# Patient Record
Sex: Male | Born: 1970 | Race: White | Hispanic: No | Marital: Married | Smoking: Current every day smoker
Health system: Southern US, Community
[De-identification: ages and names within clinical notes are randomized; demographics above are authoritative.]

## PROBLEM LIST (undated history)

## (undated) DIAGNOSIS — F419 Anxiety disorder, unspecified: Secondary | ICD-10-CM

## (undated) DIAGNOSIS — F32A Depression, unspecified: Secondary | ICD-10-CM

## (undated) DIAGNOSIS — F410 Panic disorder [episodic paroxysmal anxiety] without agoraphobia: Secondary | ICD-10-CM

## (undated) DIAGNOSIS — F429 Obsessive-compulsive disorder, unspecified: Secondary | ICD-10-CM

## (undated) DIAGNOSIS — F3181 Bipolar II disorder: Secondary | ICD-10-CM

## (undated) DIAGNOSIS — F4310 Post-traumatic stress disorder, unspecified: Secondary | ICD-10-CM

## (undated) DIAGNOSIS — K589 Irritable bowel syndrome without diarrhea: Secondary | ICD-10-CM

## (undated) DIAGNOSIS — F4010 Social phobia, unspecified: Secondary | ICD-10-CM

## (undated) DIAGNOSIS — F514 Sleep terrors [night terrors]: Secondary | ICD-10-CM

## (undated) DIAGNOSIS — F329 Major depressive disorder, single episode, unspecified: Secondary | ICD-10-CM

## (undated) DIAGNOSIS — I10 Essential (primary) hypertension: Secondary | ICD-10-CM

## (undated) HISTORY — PX: SYMPATHECTOMY: SHX792

## (undated) HISTORY — PX: VASECTOMY: SHX75

## (undated) HISTORY — DX: Irritable bowel syndrome, unspecified: K58.9

---

## 1999-09-07 ENCOUNTER — Emergency Department (HOSPITAL_COMMUNITY): Admission: EM | Admit: 1999-09-07 | Discharge: 1999-09-07 | Payer: Self-pay | Admitting: Emergency Medicine

## 2001-06-22 ENCOUNTER — Emergency Department (HOSPITAL_COMMUNITY): Admission: EM | Admit: 2001-06-22 | Discharge: 2001-06-22 | Payer: Self-pay

## 2001-08-16 ENCOUNTER — Inpatient Hospital Stay (HOSPITAL_COMMUNITY): Admission: EM | Admit: 2001-08-16 | Discharge: 2001-08-21 | Payer: Self-pay | Admitting: Psychiatry

## 2001-08-22 ENCOUNTER — Emergency Department (HOSPITAL_COMMUNITY): Admission: EM | Admit: 2001-08-22 | Discharge: 2001-08-22 | Payer: Self-pay | Admitting: Emergency Medicine

## 2001-08-22 ENCOUNTER — Encounter: Payer: Self-pay | Admitting: Emergency Medicine

## 2001-08-25 ENCOUNTER — Emergency Department (HOSPITAL_COMMUNITY): Admission: EM | Admit: 2001-08-25 | Discharge: 2001-08-25 | Payer: Self-pay | Admitting: Emergency Medicine

## 2002-12-29 ENCOUNTER — Emergency Department (HOSPITAL_COMMUNITY): Admission: EM | Admit: 2002-12-29 | Discharge: 2002-12-29 | Payer: Self-pay | Admitting: Emergency Medicine

## 2009-06-01 ENCOUNTER — Emergency Department (HOSPITAL_COMMUNITY): Admission: EM | Admit: 2009-06-01 | Discharge: 2009-06-01 | Payer: Self-pay | Admitting: Emergency Medicine

## 2010-02-18 ENCOUNTER — Emergency Department (HOSPITAL_COMMUNITY)
Admission: EM | Admit: 2010-02-18 | Discharge: 2010-02-18 | Payer: Self-pay | Source: Home / Self Care | Admitting: Emergency Medicine

## 2010-05-25 LAB — GLUCOSE, CAPILLARY: Glucose-Capillary: 93 mg/dL (ref 70–99)

## 2010-07-31 NOTE — Discharge Summary (Signed)
[facility]  Patient:    Nicholas Melton, Nicholas Melton Visit Number: [id number] MRN: [id number]          Service Type: EMS Location: [facility] Attending Physician:  Hanley Seamen Dictated by:   Reymundo Poll Dub Mikes, M.D. Admit Date:  08/25/2001 Discharge Date: 08/25/2001                             Discharge Summary  CHIEF COMPLAINT AND PRESENTING ILLNESS:  This was the first admission to [facility] for this 40 year old male, committed on June 4 for intentional overdose.  History of suicidal attempt, took 9 Xanax and drank 6 to 7 beers on Tuesday, then he took 2 more tablets.  He was at home.  His wife was asleep, then he told his wife later.  York Spaniel he had gone to the emergency room because his wife had noted some blood in the toilet.  She was initially unaware that he had overdosed.  Reported that he was tired of living.  He is having problems with finances, lost his license, and also reports periods of rage and explosive behavior, destroying property, banging his head against a wall.  He gets out of control.  Currently feeling tired but can promise safety in the unit.  PAST PSYCHIATRIC HISTORY:  First time [facility], with [facility] 17 days 1 month ago for alcohol detox.  [facility] for 2 days 2 months ago.  Committed for suicidal thoughts.  Sees Dr. Valente David.  ALCOHOL AND DRUG HISTORY:  Relapsed 20 days ago on alcohol, drinking beer, 10 beers per day, history of blackouts.  Last drink June 3.  Longest sobriety 4 months.  No other substance use.  MEDICAL HISTORY:  Chronic back pain.  MEDICATIONS:  On Zoloft since December, noncompliance.  Has been on Anafranil in the past and Effexor and Seroquel in the past.  PHYSICAL EXAMINATION:  Performed, failed to show any acute findings.  MENTAL STATUS EXAMINATION:  Reveals a well-nourished, well-developed, alert, cooperative male, fair eye contact.  Speech is monotone, clear.  Mood  is depressed, affect is flat.  Thought processes are coherent, goal directed.  No auditory or visual hallucinations, no delusions.  Cognition well preserved.  ADMITTING  DIAGNOSES: Axis I:    1. Major depression, recurrent versus impulse control disorder               not otherwise specified.            2. Alcohol dependence. Axis II:   No diagnosis. Axis III:  Chronic back pain. Axis IV:   Moderate. Axis V:    Global assessment of function upon admission 35, highest            global assessment of function in past year 60.  LABORATORY WORK-UP:  Blood chemistries were within normal limits.  Thyroid profile was within normal limits.  COURSE IN THE HOSPITAL:  He was admitted and started on intensive individual and group psychotherapy.  He was detoxified using Librium.  He was placed on Depakote 500 at bedtime and his Zoloft was increased to 75 and he was given Vistaril for anxiety.  Did admit that he had a very rough upbringing, difficulties in school, admitted to conflict in the relationship with his wife, but said he was willing to pursue outpatient treatment, but not sure if he was going to be able to abstain from drinking.  Did get good response with  the Risperdal, Seroquel or SSRI.  But he was willing to continue working with the Depakote.  Started to be very seclusive, withdrawn.  Slowly started going to groups.  By June 9, he felt he was doing much better.  Had a good session with is wife.  He could commit himself to safety, to work on his anger management so he can improve their relationship.  He was willing to pursue outpatient treatment.  As he was not suicidal or homicidal, he was fully detoxed and willing to pursue outpatient treatment, discharge was considered and granted.  DISCHARGE  DIAGNOSES: Axis I:    1. Major depression, recurrent.            2. Alcohol dependence.            3. Impulse control disorder not otherwise specified. Axis II:   No diagnosis. Axis III:   Chronic back pain. Axis IV:   Moderate. Axis V:    Global assessment of function upon discharge 55-60.  DISCHARGE MEDICATIONS: 1. Depakote ER 500 at bedtime. 2. Zoloft 100 daily. 3. Vistaril 25 1 every 8 hours as needed for anxiety.  DISPOSITION:  Follow up with Dr. Valente David. Dictated by:   Reymundo Poll Dub Mikes, M.D. Attending Physician:  Hanley Seamen DD:  10/04/01 TD:  10/05/01 Job: [id number]

## 2010-07-31 NOTE — Discharge Summary (Signed)
[facility]  Patient:    Nicholas Melton, Nicholas Melton Visit Number: [id number] MRN: [id number]          Service Type: EMS Location: [facility] Attending Physician:  Hanley Seamen Dictated by:   Jeanice Lim, M.D. Admit Date:  08/25/2001 Discharge Date: 08/25/2001                             Discharge Summary  NO DICTATION.  This dictation is Dr. Ree Shay. Dictated by:   Jeanice Lim, M.D. Attending Physician:  Hanley Seamen DD:  09/20/01 TD:  09/20/01 Job: [id number]

## 2010-07-31 NOTE — H&P (Signed)
[facility]  Patient:    KIANO, TERRIEN Visit Number: [id number] MRN: [id number]          Service Type: EMS Location: ED Attending Physician:  Nelia Shi Dictated by:   Candi Leash. Orsini, N.P. Admit Date:  08/16/2001 Discharge Date: 08/16/2001                     Psychiatric Admission Assessment  DATE OF ADMISSION:  August 16, 2001  PATIENT IDENTIFICATION:  This is a 40 year old married white male that was committed on August 16, 2001, for intentional overdose.  HISTORY OF PRESENT ILLNESS:  The patient presents with a history of a suicide attempt.  He took nine Xanax tablets and drank six to seven beers on Tuesday, then states he took two more tablets.  He was at home, his wife was asleep, and then he told his wife later.  He states that he had gotten to the emergency room because his wife had noted some blood in the toilet.  She was initially unaware that he had overdosed.  The patient reports that he is tired of living.  He is having problems with finances.  He has lost his license and also reports periods of rage and explosive behavior, destroying property, banging his head against a wall.  He states he often gets "out of control." The patient reports that he is currently feeling very "tired" today.  He does promise safety on the unit.  He states his sleep has been satisfactory.  He has a decreased appetite with a 5-7 pound weight loss.  He reports obsessive thoughts and paranoia.  PAST PSYCHIATRIC HISTORY:  First hospitalization to [facility]. Was at [facility] for 17 days one month ago for alcohol detoxification.  He was at [facility] for two days two months ago; he was committed for suicidal thoughts but with no history of a suicide attempt.  He was seeing Dr. Ilsa Iha on an outpatient basis but he is currently not seeing him due to his insurance.  SUBSTANCE ABUSE HISTORY:  The patient smokes.  He relapsed about 20 days  ago on alcohol.  He has been drinking beer, about 10 beers per day.  He has a history of blackouts.  His last drink was on August 15, 2001.  His longest history of sobriety has been four months.  He has no recent substance abuse.  PAST MEDICAL HISTORY:  Primary care Tnya Ades: Dr. Francesca Oman in [place].  Medical problems: Chronic back pain.  MEDICATIONS:  The patient is currently on Zoloft since December.  He has been noncompliant with his medications.  He has been off his medications for at least two weeks.  He has been on Anafranil, Paxil, Effexor, and Seroquel in the past.  DRUG ALLERGIES:  No known allergies.  PHYSICAL EXAMINATION:  GENERAL:  Performed at [facility].  LABORATORY DATA:  WBC 3.9, MCV elevated at 101.  Urine drug screen was positive for benzodiazepines.  CMET: Within normal limits.  Blood alcohol level was 83.  SOCIAL HISTORY:  A 40 year old married white male, married since December 2002, first marriage.  He has one child.  His wife has three children.  Their ages are 87, 66, 3, 5.  He lives with his wife and roommate.  He works as a Lobbyist.  He has completed two years of college.  He has a court date pending on June 24 for DWI.  FAMILY HISTORY:  None.  MENTAL STATUS EXAMINATION:  Alert, young adult, casually dressed, fair eye contact.  Speech is monotone, clear.  Mood is depressed.  Affect is flat. Thought processes are coherent, goal directed.  No auditory or visual hallucinations, no suicidal or homicidal ideations, no delusions.  Cognitive: Intact.  Oriented x 3.  Judgment is poor.  Insight is fair.  Poor impulse control.  ADMISSION DIAGNOSES: Axis I:    1. Major depression.            2. Rule out mood disorder. Axis II:   Deferred. Axis III:  Chronic back pain. Axis IV:   Problems with primary support group, economic, legal system, and            other psychosocial problems. Axis V:    Current is 35, estimated this past year is  65-70.  INITIAL PLAN OF CARE:  Plan is a involuntary commitment for suicide attempt. Contract for safety, check every 15 minutes.  The patient will be placed on the [address] Hall for close monitoring.  Will check his labs to follow up with his anemia.  Will encourage fluids.  Resume his Zoloft.  Will possibly increase his Zoloft to decrease depressive symptoms.  Will initiate Depakote for explosive behavior, check Depakote level.  Family session.  The patient is to remain alcohol free, to attend AA meetings after discharge, increase coping skills, be medication compliant.  ESTIMATED LENGTH OF STAY:  Four to six days. Dictated by:   Candi Leash. Orsini, N.P. Attending Physician:  Nelia Shi DD:  08/18/01 TD:  08/18/01 Job: [id number]

## 2010-09-28 ENCOUNTER — Emergency Department (HOSPITAL_COMMUNITY)
Admission: EM | Admit: 2010-09-28 | Discharge: 2010-09-28 | Disposition: A | Discharge status: Home / Self Care | Payer: Self-pay | Attending: Emergency Medicine | Admitting: Emergency Medicine

## 2010-09-28 DIAGNOSIS — F1010 Alcohol abuse, uncomplicated: Secondary | ICD-10-CM | POA: Insufficient documentation

## 2010-09-28 LAB — CBC
HCT: 43.9 % (ref 39.0–52.0)
Hemoglobin: 15.8 g/dL (ref 13.0–17.0)
MCH: 34.3 pg — ABNORMAL HIGH (ref 26.0–34.0)
MCHC: 36 g/dL (ref 30.0–36.0)
MCV: 95.4 fL (ref 78.0–100.0)
Platelets: 251 10*3/uL (ref 150–400)
RBC: 4.6 MIL/uL (ref 4.22–5.81)
RDW: 13.4 % (ref 11.5–15.5)
WBC: 5.3 10*3/uL (ref 4.0–10.5)

## 2010-09-28 LAB — RAPID URINE DRUG SCREEN, HOSP PERFORMED
Amphetamines: NOT DETECTED
Barbiturates: NOT DETECTED
Benzodiazepines: NOT DETECTED
Cocaine: NOT DETECTED
Opiates: NOT DETECTED
Tetrahydrocannabinol: NOT DETECTED

## 2010-09-28 LAB — DIFFERENTIAL
Basophils Absolute: 0 10*3/uL (ref 0.0–0.1)
Basophils Relative: 1 % (ref 0–1)
Eosinophils Absolute: 0 10*3/uL (ref 0.0–0.7)
Eosinophils Relative: 1 % (ref 0–5)
Lymphocytes Relative: 35 % (ref 12–46)
Lymphs Abs: 1.9 10*3/uL (ref 0.7–4.0)
Monocytes Absolute: 0.4 10*3/uL (ref 0.1–1.0)
Monocytes Relative: 8 % (ref 3–12)
Neutro Abs: 3 10*3/uL (ref 1.7–7.7)
Neutrophils Relative %: 56 % (ref 43–77)

## 2010-09-28 LAB — BASIC METABOLIC PANEL
CO2: 24 mEq/L (ref 19–32)
Calcium: 9.4 mg/dL (ref 8.4–10.5)
Chloride: 89 mEq/L — ABNORMAL LOW (ref 96–112)
Glucose, Bld: 85 mg/dL (ref 70–99)
Potassium: 4 mEq/L (ref 3.5–5.1)
Sodium: 127 mEq/L — ABNORMAL LOW (ref 135–145)

## 2010-09-28 LAB — ETHANOL: Alcohol, Ethyl (B): 273 mg/dL — ABNORMAL HIGH (ref 0–11)

## 2010-11-18 ENCOUNTER — Emergency Department (HOSPITAL_COMMUNITY)
Admission: EM | Admit: 2010-11-18 | Discharge: 2010-11-19 | Disposition: A | Discharge status: Home / Self Care | Payer: Self-pay | Attending: Emergency Medicine | Admitting: Emergency Medicine

## 2010-11-18 DIAGNOSIS — F411 Generalized anxiety disorder: Secondary | ICD-10-CM | POA: Insufficient documentation

## 2010-11-18 DIAGNOSIS — F319 Bipolar disorder, unspecified: Secondary | ICD-10-CM | POA: Insufficient documentation

## 2010-11-18 DIAGNOSIS — F1010 Alcohol abuse, uncomplicated: Secondary | ICD-10-CM | POA: Insufficient documentation

## 2010-11-18 LAB — CBC
MCH: 34.8 pg — ABNORMAL HIGH (ref 26.0–34.0)
MCHC: 35.8 g/dL (ref 30.0–36.0)
RDW: 13.8 % (ref 11.5–15.5)

## 2010-11-18 LAB — BASIC METABOLIC PANEL
CO2: 25 mEq/L (ref 19–32)
Calcium: 9.3 mg/dL (ref 8.4–10.5)
Creatinine, Ser: 0.64 mg/dL (ref 0.50–1.35)
GFR calc non Af Amer: >60 mL/min (ref >60)
Sodium: 134 mEq/L — ABNORMAL LOW (ref 135–145)

## 2010-11-18 LAB — URINALYSIS, ROUTINE W REFLEX MICROSCOPIC
Glucose, UA: NEGATIVE mg/dL
Hgb urine dipstick: NEGATIVE
Ketones, ur: NEGATIVE mg/dL
Leukocytes, UA: NEGATIVE
Protein, ur: NEGATIVE mg/dL
Urobilinogen, UA: 0.2 mg/dL (ref 0.0–1.0)

## 2010-11-18 LAB — RAPID URINE DRUG SCREEN, HOSP PERFORMED
Amphetamines: NOT DETECTED
Opiates: NOT DETECTED
Tetrahydrocannabinol: NOT DETECTED

## 2010-11-18 LAB — ETHANOL: Alcohol, Ethyl (B): 197 mg/dL — ABNORMAL HIGH (ref 0–11)

## 2011-06-13 ENCOUNTER — Encounter (HOSPITAL_COMMUNITY): Payer: Self-pay | Admitting: *Deleted

## 2011-06-13 ENCOUNTER — Emergency Department (HOSPITAL_COMMUNITY)
Admission: EM | Admit: 2011-06-13 | Discharge: 2011-06-14 | Disposition: A | Discharge status: Other Acute Inpatient Hospital | Payer: 59 | Attending: Emergency Medicine | Admitting: Emergency Medicine

## 2011-06-13 DIAGNOSIS — I10 Essential (primary) hypertension: Secondary | ICD-10-CM | POA: Insufficient documentation

## 2011-06-13 DIAGNOSIS — E871 Hypo-osmolality and hyponatremia: Secondary | ICD-10-CM

## 2011-06-13 DIAGNOSIS — F411 Generalized anxiety disorder: Secondary | ICD-10-CM | POA: Insufficient documentation

## 2011-06-13 DIAGNOSIS — F17200 Nicotine dependence, unspecified, uncomplicated: Secondary | ICD-10-CM | POA: Insufficient documentation

## 2011-06-13 DIAGNOSIS — F4310 Post-traumatic stress disorder, unspecified: Secondary | ICD-10-CM | POA: Insufficient documentation

## 2011-06-13 DIAGNOSIS — F1010 Alcohol abuse, uncomplicated: Secondary | ICD-10-CM | POA: Insufficient documentation

## 2011-06-13 DIAGNOSIS — F319 Bipolar disorder, unspecified: Secondary | ICD-10-CM | POA: Insufficient documentation

## 2011-06-13 HISTORY — DX: Social phobia, unspecified: F40.10

## 2011-06-13 HISTORY — DX: Obsessive-compulsive disorder, unspecified: F42.9

## 2011-06-13 HISTORY — DX: Sleep terrors (night terrors): F51.4

## 2011-06-13 HISTORY — DX: Bipolar II disorder: F31.81

## 2011-06-13 HISTORY — DX: Depression, unspecified: F32.A

## 2011-06-13 HISTORY — DX: Panic disorder (episodic paroxysmal anxiety): F41.0

## 2011-06-13 HISTORY — DX: Essential (primary) hypertension: I10

## 2011-06-13 HISTORY — DX: Post-traumatic stress disorder, unspecified: F43.10

## 2011-06-13 HISTORY — DX: Anxiety disorder, unspecified: F41.9

## 2011-06-13 HISTORY — DX: Major depressive disorder, single episode, unspecified: F32.9

## 2011-06-13 LAB — BASIC METABOLIC PANEL
BUN: 7 mg/dL (ref 6–23)
CO2: 28 mEq/L (ref 19–32)
GFR calc non Af Amer: >90 mL/min (ref >90)
Glucose, Bld: 145 mg/dL — ABNORMAL HIGH (ref 70–99)
Potassium: 4.3 mEq/L (ref 3.5–5.1)

## 2011-06-13 LAB — CBC
Hemoglobin: 15 g/dL (ref 13.0–17.0)
Platelets: 236 10*3/uL (ref 150–400)
RBC: 4.24 MIL/uL (ref 4.22–5.81)
WBC: 6 10*3/uL (ref 4.0–10.5)

## 2011-06-13 LAB — COMPREHENSIVE METABOLIC PANEL
ALT: 23 U/L (ref 0–53)
AST: 37 U/L (ref 0–37)
Alkaline Phosphatase: 84 U/L (ref 39–117)
CO2: 23 mEq/L (ref 19–32)
Chloride: 89 mEq/L — ABNORMAL LOW (ref 96–112)
GFR calc Af Amer: >90 mL/min (ref >90)
GFR calc non Af Amer: >90 mL/min (ref >90)
Glucose, Bld: 116 mg/dL — ABNORMAL HIGH (ref 70–99)
Sodium: 125 mEq/L — ABNORMAL LOW (ref 135–145)
Total Bilirubin: 0.7 mg/dL (ref 0.3–1.2)

## 2011-06-13 LAB — RAPID URINE DRUG SCREEN, HOSP PERFORMED
Amphetamines: NOT DETECTED
Barbiturates: NOT DETECTED
Tetrahydrocannabinol: NOT DETECTED

## 2011-06-13 MED ORDER — ZOLPIDEM TARTRATE 5 MG PO TABS
5.0000 mg | ORAL_TABLET | Freq: Every evening | ORAL | Status: DC | PRN
  Administered 2011-06-13: 5 mg via ORAL
  Filled 2011-06-13: qty 1

## 2011-06-13 MED ORDER — CHLORDIAZEPOXIDE HCL 25 MG PO CAPS
25.0000 mg | ORAL_CAPSULE | Freq: Once | ORAL | Status: DC
  Filled 2011-06-13: qty 1

## 2011-06-13 MED ORDER — ONDANSETRON 4 MG PO TBDP: 4.0000 mg | ORAL_TABLET | Freq: Four times a day (QID) | ORAL | Status: DC | PRN

## 2011-06-13 MED ORDER — NICOTINE 21 MG/24HR TD PT24
21.0000 mg | MEDICATED_PATCH | Freq: Every day | TRANSDERMAL | Status: DC
  Administered 2011-06-13 – 2011-06-14 (×2): 21 mg via TRANSDERMAL
  Filled 2011-06-13 (×2): qty 1

## 2011-06-13 MED ORDER — ADULT MULTIVITAMIN W/MINERALS CH
1.0000 | ORAL_TABLET | Freq: Every day | ORAL | Status: DC
  Administered 2011-06-13 – 2011-06-14 (×2): 1 via ORAL
  Filled 2011-06-13 (×2): qty 1

## 2011-06-13 MED ORDER — ATENOLOL 25 MG PO TABS
25.0000 mg | ORAL_TABLET | Freq: Every day | ORAL | Status: DC
  Administered 2011-06-13 – 2011-06-14 (×2): 25 mg via ORAL
  Filled 2011-06-13 (×4): qty 1

## 2011-06-13 MED ORDER — SODIUM CHLORIDE 0.9 % IV SOLN
Freq: Once | INTRAVENOUS | Status: AC
  Administered 2011-06-13: 15:00:00 via INTRAVENOUS

## 2011-06-13 MED ORDER — CHLORDIAZEPOXIDE HCL 25 MG PO CAPS
25.0000 mg | ORAL_CAPSULE | Freq: Four times a day (QID) | ORAL | Status: DC | PRN
  Administered 2011-06-13 – 2011-06-14 (×5): 25 mg via ORAL
  Filled 2011-06-13 (×4): qty 1

## 2011-06-13 MED ORDER — SODIUM CHLORIDE 0.9 % IV BOLUS (SEPSIS)
1000.0000 mL | Freq: Once | INTRAVENOUS | Status: AC
  Administered 2011-06-13: 1000 mL via INTRAVENOUS

## 2011-06-13 MED ORDER — CHLORDIAZEPOXIDE HCL 25 MG PO CAPS
25.0000 mg | ORAL_CAPSULE | Freq: Four times a day (QID) | ORAL | Status: DC
  Administered 2011-06-13 – 2011-06-14 (×3): 25 mg via ORAL
  Filled 2011-06-13: qty 1

## 2011-06-13 MED ORDER — SERTRALINE HCL 50 MG PO TABS
25.0000 mg | ORAL_TABLET | Freq: Every day | ORAL | Status: DC
  Administered 2011-06-13 – 2011-06-14 (×2): 25 mg via ORAL
  Filled 2011-06-13 (×2): qty 1

## 2011-06-13 MED ORDER — CHLORDIAZEPOXIDE HCL 25 MG PO CAPS: 25.0000 mg | ORAL_CAPSULE | ORAL | Status: DC

## 2011-06-13 MED ORDER — CHLORDIAZEPOXIDE HCL 25 MG PO CAPS: 25.0000 mg | ORAL_CAPSULE | Freq: Three times a day (TID) | ORAL | Status: DC

## 2011-06-13 MED ORDER — IBUPROFEN 600 MG PO TABS
600.0000 mg | ORAL_TABLET | Freq: Three times a day (TID) | ORAL | Status: DC | PRN
  Administered 2011-06-14: 600 mg via ORAL
  Filled 2011-06-13: qty 1

## 2011-06-13 MED ORDER — VITAMIN B-1 100 MG PO TABS
100.0000 mg | ORAL_TABLET | Freq: Every day | ORAL | Status: DC
  Administered 2011-06-13 – 2011-06-14 (×2): 100 mg via ORAL
  Filled 2011-06-13 (×2): qty 1

## 2011-06-13 MED ORDER — LORAZEPAM 1 MG PO TABS: 1.0000 mg | ORAL_TABLET | Freq: Three times a day (TID) | ORAL | Status: DC | PRN

## 2011-06-13 MED ORDER — ALPRAZOLAM 0.5 MG PO TABS: 0.5000 mg | ORAL_TABLET | Freq: Three times a day (TID) | ORAL | Status: DC | PRN

## 2011-06-13 MED ORDER — LOPERAMIDE HCL 2 MG PO CAPS
2.0000 mg | ORAL_CAPSULE | ORAL | Status: DC | PRN
Start: 2011-06-13 — End: 2011-06-14

## 2011-06-13 MED ORDER — ALUM & MAG HYDROXIDE-SIMETH 200-200-20 MG/5ML PO SUSP: 30.0000 mL | ORAL | Status: DC | PRN

## 2011-06-13 MED ORDER — FOLIC ACID 1 MG PO TABS
1.0000 mg | ORAL_TABLET | Freq: Every day | ORAL | Status: DC
  Administered 2011-06-13 – 2011-06-14 (×2): 1 mg via ORAL
  Filled 2011-06-13 (×2): qty 1

## 2011-06-13 MED ORDER — ACETAMINOPHEN 325 MG PO TABS
650.0000 mg | ORAL_TABLET | ORAL | Status: DC | PRN
  Administered 2011-06-13: 650 mg via ORAL
  Filled 2011-06-13: qty 2

## 2011-06-13 MED ORDER — CHLORDIAZEPOXIDE HCL 25 MG PO CAPS: 25.0000 mg | ORAL_CAPSULE | Freq: Every day | ORAL | Status: DC

## 2011-06-13 MED ORDER — HYDROXYZINE HCL 25 MG PO TABS: 25.0000 mg | ORAL_TABLET | Freq: Four times a day (QID) | ORAL | Status: DC | PRN

## 2011-06-13 MED ORDER — ONDANSETRON HCL 4 MG PO TABS: 4.0000 mg | ORAL_TABLET | Freq: Three times a day (TID) | ORAL | Status: DC | PRN

## 2011-06-13 NOTE — ED Notes (Signed)
Up to the desk on the phone 
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
## 2011-06-13 NOTE — ED Notes (Signed)
Pt reports history of ETOH abuse since age 41.  Pt reports this episode of ETOH abuse has been going on for two months.  14-16 beers per day. Pt reports drinking beer and wine, no liquor. Last drink was an hour ago-  drank 1.5L of wine.  Pt was last seen at [facility] for the same issue several years ago.  Pt denies drug abuse, SI/HI.  Pt denies pain.
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
## 2011-06-13 NOTE — ED Provider Notes (Addendum)
History     CSN: [id number]  Arrival date & time 06/13/11  1237   First MD Initiated Contact with Patient 06/13/11 1315      Chief Complaint  Patient presents with  . Alcohol Intoxication  . Alcohol Problem    (Consider location/radiation/quality/duration/timing/severity/associated sxs/prior treatment) HPI Comments: Patient presents with a significant alcohol abuse history.  He notes alcohol abuse for many years on and off.  Over the last few months she's been drinking 14-1612 ounce beers per day.  He also drinks wine medication.  His last crit was this morning when apparently he woke up with a panic attack at approximately 7 AM.  He drinks one bottle of wine through the morning.  He comes today with his girlfriend and his mother seeking help and detox.  He denies any other substance abuse or suicidal or homicidal thoughts.  He does endorse a history of anxiety for which he is intermittently been on Zoloft and Xanax.  He entered a scheduled outpatient appointment with a psychiatrist as well but has not been to his first appointment.  Patient is a 41 y.o. male presenting with mental health disorder. The history is provided by the patient and a parent. No language interpreter was used.  Mental Health Problem  Additional symptoms of the illness do not include no headaches or no abdominal pain.    Past Medical History  Diagnosis Date  . Hypertension   . Bipolar 2 disorder   . OCD (obsessive compulsive disorder)   . Anxiety   . Depression   . Panic attacks   . Social phobia   . Posttraumatic stress disorder   . Night terrors, adult     Past Surgical History  Procedure Date  . Vasectomy   . Thyroidectomy     History reviewed. No pertinent family history.  History  Substance Use Topics  . Smoking status: Current Everyday Smoker  . Smokeless tobacco: Not on file  . Alcohol Use: Yes      Review of Systems  Constitutional: Negative.  Negative for fever and chills.  HENT:  Negative.   Eyes: Negative.  Negative for discharge and redness.  Respiratory: Negative.  Negative for cough and shortness of breath.   Cardiovascular: Negative.  Negative for chest pain.  Gastrointestinal: Negative.  Negative for nausea, vomiting and abdominal pain.  Genitourinary: Negative.  Negative for hematuria.  Musculoskeletal: Negative.  Negative for back pain.  Skin: Negative.  Negative for color change and rash.  Neurological: Negative for syncope and headaches.  Hematological: Negative.  Negative for adenopathy.  Psychiatric/Behavioral: Negative for confusion. The patient is nervous/anxious.   All other systems reviewed and are negative.    Allergies  Review of patient's allergies indicates no known allergies.  Home Medications   Current Outpatient Rx  Name Route Sig Dispense Refill  . ACETAMINOPHEN 500 MG PO TABS Oral Take 1,000-1,500 mg by mouth every 8 (eight) hours as needed. For pain.    Marland Kitchen ALPRAZOLAM 0.5 MG PO TABS Oral Take 0.5 mg by mouth 3 (three) times daily as needed. For anxiety.    . ATENOLOL 25 MG PO TABS Oral Take 25 mg by mouth daily.    Marland Kitchen DIPHENHYDRAMINE HCL 12.5 MG/5ML PO LIQD Oral Take 25 mg by mouth at bedtime as needed. For sleep.    Marland Kitchen DIPHENHYDRAMINE-APAP (SLEEP) 25-500 MG PO TABS Oral Take 1-2 tablets by mouth at bedtime as needed. For pain.    Marland Kitchen ADVIL PM PO Oral Take 2 tablets  by mouth at bedtime as needed. For sleep.    Marland Kitchen OVER THE COUNTER MEDICATION Oral Take 1 packet by mouth daily. GNC powder multivitamin.    Marland Kitchen OXYMETAZOLINE HCL 0.05 % NA SOLN Nasal Place 2 sprays into the nose 3 (three) times daily as needed. For dryness.    . SERTRALINE HCL 25 MG PO TABS Oral Take 25 mg by mouth daily.      BP 145/87  Pulse 99  Temp(Src) 99.2 F (37.3 C) (Oral)  Resp 16  SpO2 100%  Physical Exam  Nursing note and vitals reviewed. Constitutional: He is oriented to person, place, and time. He appears well-developed and well-nourished.  Non-toxic  appearance. He does not have a sickly appearance.  HENT:  Head: Normocephalic and atraumatic.  Eyes: Conjunctivae, EOM and lids are normal. Pupils are equal, round, and reactive to light.  Neck: Trachea normal, normal range of motion and full passive range of motion without pain. Neck supple.  Cardiovascular: Normal rate, regular rhythm and normal heart sounds.   Pulmonary/Chest: Effort normal and breath sounds normal. No respiratory distress. He has no wheezes. He has no rales.  Abdominal: Soft. Normal appearance. He exhibits no distension. There is no tenderness. There is no rebound and no CVA tenderness.  Musculoskeletal: Normal range of motion.  Neurological: He is alert and oriented to person, place, and time. He has normal strength.  Skin: Skin is warm, dry and intact. No rash noted.  Psychiatric: He has a normal mood and affect. His behavior is normal. Judgment and thought content normal.    ED Course  Procedures (including critical care time)  Results for orders placed during the hospital encounter of 06/13/11  CBC      Component Value Range   WBC 6.0  4.0 - 10.5 (K/uL)   RBC 4.24  4.22 - 5.81 (MIL/uL)   Hemoglobin 15.0  13.0 - 17.0 (g/dL)   HCT 82.9  56.2 - 13.0 (%)   MCV 96.9  78.0 - 100.0 (fL)   MCH 35.4 (*) 26.0 - 34.0 (pg)   MCHC 36.5 (*) 30.0 - 36.0 (g/dL)   RDW 86.5  78.4 - 69.6 (%)   Platelets 236  150 - 400 (K/uL)  COMPREHENSIVE METABOLIC PANEL      Component Value Range   Sodium 125 (*) 135 - 145 (mEq/L)   Potassium 4.3  3.5 - 5.1 (mEq/L)   Chloride 89 (*) 96 - 112 (mEq/L)   CO2 23  19 - 32 (mEq/L)   Glucose, Bld 116 (*) 70 - 99 (mg/dL)   BUN 8  6 - 23 (mg/dL)   Creatinine, Ser 2.95  0.50 - 1.35 (mg/dL)   Calcium 8.7  8.4 - 28.4 (mg/dL)   Total Protein 7.1  6.0 - 8.3 (g/dL)   Albumin 4.5  3.5 - 5.2 (g/dL)   AST 37  0 - 37 (U/L)   ALT 23  0 - 53 (U/L)   Alkaline Phosphatase 84  39 - 117 (U/L)   Total Bilirubin 0.7  0.3 - 1.2 (mg/dL)   GFR calc non Af Amer  >90  >90 (mL/min)   GFR calc Af Amer >90  >90 (mL/min)  ETHANOL      Component Value Range   Alcohol, Ethyl (B) 176 (*) 0 - 11 (mg/dL)  ACETAMINOPHEN LEVEL      Component Value Range   Acetaminophen (Tylenol), Serum <15.0  10 - 30 (ug/mL)       MDM  Patient presents with  alcohol abuse and desire to detox.  He denies suicidal or homicidal thoughts.  He does drink partially related to anxiety.  Patient is here of his own free will and has a supportive mother and girlfriend.  I'll contact the ACT team for further evaluation and placement of this patient        Nat Christen, MD 06/13/11 1353  Patient is hyponatremic here.  I will begin by giving the patient IV fluids and then recheck his laboratory studies at approximately 8 PM.  Is otherwise pending behavioral health assessment at this time for placement for alcohol detox.  Nat Christen, MD 06/13/11 1356

## 2011-06-13 NOTE — ED Notes (Signed)
Pt girlfriend Marcelino Duster would like to be called with changes in plan.  She can be reached at [phone]

## 2011-06-14 ENCOUNTER — Encounter (HOSPITAL_COMMUNITY): Payer: Self-pay

## 2011-06-14 ENCOUNTER — Inpatient Hospital Stay (HOSPITAL_COMMUNITY)
Admission: AD | Admit: 2011-06-14 | Discharge: 2011-06-19 | DRG: 897 | Disposition: A | Discharge status: Home / Self Care | Payer: No Typology Code available for payment source | Source: Ambulatory Visit | Attending: Psychiatry | Admitting: Psychiatry

## 2011-06-14 DIAGNOSIS — F329 Major depressive disorder, single episode, unspecified: Secondary | ICD-10-CM | POA: Diagnosis present

## 2011-06-14 DIAGNOSIS — Z5900 Homelessness unspecified: Secondary | ICD-10-CM

## 2011-06-14 DIAGNOSIS — Z79899 Other long term (current) drug therapy: Secondary | ICD-10-CM

## 2011-06-14 DIAGNOSIS — F429 Obsessive-compulsive disorder, unspecified: Secondary | ICD-10-CM

## 2011-06-14 DIAGNOSIS — F4310 Post-traumatic stress disorder, unspecified: Secondary | ICD-10-CM | POA: Diagnosis present

## 2011-06-14 DIAGNOSIS — I10 Essential (primary) hypertension: Secondary | ICD-10-CM

## 2011-06-14 DIAGNOSIS — F1020 Alcohol dependence, uncomplicated: Principal | ICD-10-CM | POA: Diagnosis present

## 2011-06-14 DIAGNOSIS — F319 Bipolar disorder, unspecified: Secondary | ICD-10-CM

## 2011-06-14 DIAGNOSIS — Z789 Other specified health status: Secondary | ICD-10-CM

## 2011-06-14 DIAGNOSIS — F4010 Social phobia, unspecified: Secondary | ICD-10-CM

## 2011-06-14 DIAGNOSIS — F411 Generalized anxiety disorder: Secondary | ICD-10-CM

## 2011-06-14 MED ORDER — CHLORDIAZEPOXIDE HCL 25 MG PO CAPS: 25.0000 mg | ORAL_CAPSULE | ORAL | Status: DC

## 2011-06-14 MED ORDER — FOLIC ACID 1 MG PO TABS
1.0000 mg | ORAL_TABLET | Freq: Every day | ORAL | Status: DC
  Administered 2011-06-15 – 2011-06-19 (×5): 1 mg via ORAL
  Filled 2011-06-14 (×8): qty 1

## 2011-06-14 MED ORDER — THIAMINE HCL 100 MG/ML IJ SOLN: 100.0000 mg | Freq: Once | INTRAMUSCULAR | Status: DC

## 2011-06-14 MED ORDER — ADULT MULTIVITAMIN W/MINERALS CH
1.0000 | ORAL_TABLET | Freq: Every day | ORAL | Status: DC
  Administered 2011-06-15: 1 via ORAL
  Filled 2011-06-14 (×3): qty 1

## 2011-06-14 MED ORDER — ACETAMINOPHEN 325 MG PO TABS: 650.0000 mg | ORAL_TABLET | Freq: Four times a day (QID) | ORAL | Status: DC | PRN

## 2011-06-14 MED ORDER — VITAMIN B-1 100 MG PO TABS
100.0000 mg | ORAL_TABLET | Freq: Every day | ORAL | Status: DC
  Administered 2011-06-15 – 2011-06-19 (×5): 100 mg via ORAL
  Filled 2011-06-14 (×7): qty 1

## 2011-06-14 MED ORDER — NICOTINE 21 MG/24HR TD PT24
21.0000 mg | MEDICATED_PATCH | Freq: Every day | TRANSDERMAL | Status: DC
  Administered 2011-06-15 – 2011-06-19 (×5): 21 mg via TRANSDERMAL
  Filled 2011-06-14 (×8): qty 1

## 2011-06-14 MED ORDER — MAGNESIUM HYDROXIDE 400 MG/5ML PO SUSP: 30.0000 mL | Freq: Every day | ORAL | Status: DC | PRN

## 2011-06-14 MED ORDER — ALUM & MAG HYDROXIDE-SIMETH 200-200-20 MG/5ML PO SUSP
30.0000 mL | ORAL | Status: DC | PRN
  Administered 2011-06-17: 30 mL via ORAL

## 2011-06-14 MED ORDER — ALUM & MAG HYDROXIDE-SIMETH 200-200-20 MG/5ML PO SUSP: 30.0000 mL | ORAL | Status: DC | PRN

## 2011-06-14 MED ORDER — TRAZODONE HCL 50 MG PO TABS
50.0000 mg | ORAL_TABLET | Freq: Every evening | ORAL | Status: DC | PRN
  Administered 2011-06-14 – 2011-06-19 (×7): 50 mg via ORAL
  Filled 2011-06-14 (×7): qty 1

## 2011-06-14 MED ORDER — LOPERAMIDE HCL 2 MG PO CAPS: 2.0000 mg | ORAL_CAPSULE | ORAL | Status: DC | PRN

## 2011-06-14 MED ORDER — ONDANSETRON 4 MG PO TBDP: 4.0000 mg | ORAL_TABLET | Freq: Four times a day (QID) | ORAL | Status: DC | PRN

## 2011-06-14 MED ORDER — CHLORDIAZEPOXIDE HCL 25 MG PO CAPS: 25.0000 mg | ORAL_CAPSULE | Freq: Three times a day (TID) | ORAL | Status: DC

## 2011-06-14 MED ORDER — CHLORDIAZEPOXIDE HCL 25 MG PO CAPS: 25.0000 mg | ORAL_CAPSULE | Freq: Four times a day (QID) | ORAL | Status: DC | PRN

## 2011-06-14 MED ORDER — CHLORDIAZEPOXIDE HCL 25 MG PO CAPS
25.0000 mg | ORAL_CAPSULE | Freq: Four times a day (QID) | ORAL | Status: DC
  Administered 2011-06-14 – 2011-06-15 (×3): 25 mg via ORAL
  Filled 2011-06-14 (×3): qty 1

## 2011-06-14 MED ORDER — CHLORDIAZEPOXIDE HCL 25 MG PO CAPS: 25.0000 mg | ORAL_CAPSULE | Freq: Every day | ORAL | Status: DC

## 2011-06-14 MED ORDER — HYDROXYZINE HCL 25 MG PO TABS: 25.0000 mg | ORAL_TABLET | ORAL | Status: DC | PRN

## 2011-06-14 MED ORDER — ACETAMINOPHEN 325 MG PO TABS
650.0000 mg | ORAL_TABLET | Freq: Four times a day (QID) | ORAL | Status: DC | PRN
Start: 2011-06-14 — End: 2011-06-19
  Administered 2011-06-17 – 2011-06-19 (×3): 650 mg via ORAL

## 2011-06-14 NOTE — Tx Team (Signed)
Initial Interdisciplinary Treatment Plan  PATIENT STRENGTHS: (choose at least two) Ability for insight Average or above average intelligence Capable of independent living Communication skills General fund of knowledge  PATIENT STRESSORS: Health problems Loss of girlfriend* Substance abuse   PROBLEM LIST: Problem List/Patient Goals Date to be addressed Date deferred Reason deferred Estimated date of resolution  Depression      Substance Absuse                                                 DISCHARGE CRITERIA:  Motivation to continue treatment in a less acute level of care  PRELIMINARY DISCHARGE PLAN: Outpatient therapy  PATIENT/FAMIILY INVOLVEMENT: This treatment plan has been presented to and reviewed with the patient, Nicholas Melton, and/or family member.  The patient and family have been given the opportunity to ask questions and make suggestions.  Nicholas Melton [facility] 06/14/2011, 7:02 PM

## 2011-06-14 NOTE — ED Notes (Signed)
Report called to [place], Charity fundraiser at [facility].  BH ready fro pt.
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
## 2011-06-14 NOTE — ED Provider Notes (Signed)
BP 144/87  Pulse 59  Temp(Src) 97.6 F (36.4 C) (Oral)  Resp 20  SpO2 99%   Patient seen and evaluated by me. No complaints at this time.   Forbes Cellar, MD 06/14/11 [phone]

## 2011-06-15 DIAGNOSIS — F329 Major depressive disorder, single episode, unspecified: Secondary | ICD-10-CM | POA: Diagnosis present

## 2011-06-15 DIAGNOSIS — Z789 Other specified health status: Secondary | ICD-10-CM

## 2011-06-15 DIAGNOSIS — Z590 Homelessness: Secondary | ICD-10-CM

## 2011-06-15 DIAGNOSIS — F1020 Alcohol dependence, uncomplicated: Principal | ICD-10-CM

## 2011-06-15 DIAGNOSIS — F4310 Post-traumatic stress disorder, unspecified: Secondary | ICD-10-CM | POA: Diagnosis present

## 2011-06-15 LAB — COMPREHENSIVE METABOLIC PANEL
ALT: 28 U/L (ref 0–53)
AST: 36 U/L (ref 0–37)
Albumin: 4.5 g/dL (ref 3.5–5.2)
Calcium: 9.6 mg/dL (ref 8.4–10.5)
Creatinine, Ser: 0.74 mg/dL (ref 0.50–1.35)
Sodium: 138 mEq/L (ref 135–145)

## 2011-06-15 LAB — LIPID PANEL
HDL: 94 mg/dL (ref >39)
LDL Cholesterol: 68 mg/dL (ref 0–99)
Total CHOL/HDL Ratio: 2 RATIO
Triglycerides: 119 mg/dL (ref <150)
VLDL: 24 mg/dL (ref 0–40)

## 2011-06-15 LAB — CBC
MCH: 35.1 pg — ABNORMAL HIGH (ref 26.0–34.0)
MCV: 101.7 fL — ABNORMAL HIGH (ref 78.0–100.0)
Platelets: 245 10*3/uL (ref 150–400)
RDW: 14.4 % (ref 11.5–15.5)
WBC: 5.2 10*3/uL (ref 4.0–10.5)

## 2011-06-15 MED ORDER — CHLORDIAZEPOXIDE HCL 25 MG PO CAPS
25.0000 mg | ORAL_CAPSULE | Freq: Four times a day (QID) | ORAL | Status: AC
  Administered 2011-06-15 – 2011-06-16 (×6): 25 mg via ORAL
  Filled 2011-06-15 (×6): qty 1

## 2011-06-15 MED ORDER — THIAMINE HCL 100 MG/ML IJ SOLN
100.0000 mg | Freq: Once | INTRAMUSCULAR | Status: AC
  Administered 2011-06-15: 100 mg via INTRAMUSCULAR

## 2011-06-15 MED ORDER — HYDROXYZINE HCL 25 MG PO TABS
25.0000 mg | ORAL_TABLET | Freq: Four times a day (QID) | ORAL | Status: AC | PRN
  Administered 2011-06-17: 25 mg via ORAL
  Filled 2011-06-15: qty 1

## 2011-06-15 MED ORDER — VITAMIN B-1 100 MG PO TABS
100.0000 mg | ORAL_TABLET | Freq: Every day | ORAL | Status: DC
  Filled 2011-06-15: qty 1

## 2011-06-15 MED ORDER — CHLORDIAZEPOXIDE HCL 25 MG PO CAPS
25.0000 mg | ORAL_CAPSULE | Freq: Three times a day (TID) | ORAL | Status: AC
  Administered 2011-06-17 (×3): 25 mg via ORAL
  Filled 2011-06-15 (×3): qty 1

## 2011-06-15 MED ORDER — LOPERAMIDE HCL 2 MG PO CAPS: 2.0000 mg | ORAL_CAPSULE | ORAL | Status: AC | PRN

## 2011-06-15 MED ORDER — CHLORDIAZEPOXIDE HCL 25 MG PO CAPS
25.0000 mg | ORAL_CAPSULE | Freq: Every day | ORAL | Status: AC
  Administered 2011-06-19: 25 mg via ORAL
  Filled 2011-06-15 (×2): qty 1

## 2011-06-15 MED ORDER — ONDANSETRON 4 MG PO TBDP: 4.0000 mg | ORAL_TABLET | Freq: Four times a day (QID) | ORAL | Status: AC | PRN

## 2011-06-15 MED ORDER — CHLORDIAZEPOXIDE HCL 25 MG PO CAPS
25.0000 mg | ORAL_CAPSULE | ORAL | Status: AC
  Administered 2011-06-18 (×2): 25 mg via ORAL
  Filled 2011-06-15 (×2): qty 1

## 2011-06-15 MED ORDER — CHLORDIAZEPOXIDE HCL 25 MG PO CAPS
25.0000 mg | ORAL_CAPSULE | Freq: Four times a day (QID) | ORAL | Status: AC | PRN
  Administered 2011-06-18: 25 mg via ORAL

## 2011-06-15 MED ORDER — ADULT MULTIVITAMIN W/MINERALS CH
1.0000 | ORAL_TABLET | Freq: Every day | ORAL | Status: DC
  Administered 2011-06-15 – 2011-06-19 (×5): 1 via ORAL
  Filled 2011-06-15 (×6): qty 1

## 2011-06-15 NOTE — Progress Notes (Signed)
Patient ID: Nicholas Melton, male   DOB: Feb 24, 1971, 41 y.o.   MRN: [id number] He has been up and about and to groups.  Interacting with peers and staff.  He has been cooperative and calm .  Self inventory depressed at 6, hopeless 1,  Chilling and tremors.

## 2011-06-15 NOTE — Progress Notes (Signed)
Recreation Therapy Group Note  Date: 06/15/2011        Time: 1000       Group Topic/Focus: Patient invited to participate in animal assisted therapy. Pets as a coping skill and responsibility were discussed.   Participation Level: Active  Participation Quality: Appropriate and Attentive  Affect: Appropriate  Cognitive: Appropriate and Oriented   Additional Comments: None
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
## 2011-06-15 NOTE — Progress Notes (Signed)
[facility] Group Notes:  (Counselor/Nursing/MHT/Case Management/Adjunct)  06/15/2011 3:54 PM   Type of Therapy:  Processing Group at 11:00 am on 06/14/11  Participation Level:  Active  Participation Quality:  Attentive and sharing  Affect:  Appropriate  Cognitive:  Appropriate  Insight:  Good  Engagement in Group:  Good  Engagement in Therapy:  Good  Modes of Intervention:  Exploration and Support  Summary of Progress/Problems:  Patient shared his obstacles to recovery include depression, that comes with abstinence, not being comfortable in groups and physical pain.  Nicholas Melton sees the conflict in using alcohol to numb the emotional pain which then in turn creates physical pain.  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

## 2011-06-15 NOTE — Discharge Planning (Signed)
Pt verbalized that his reason for admission was due to depression, alcohol, and anxiety. Pt stated that the longest he has been sober is for 6 months and then over the years 5 wks or less. Pt stated that he was seeing a therapist and staying on his medications to assist with his sobriety. Pt stated that he would like to return back home to live with his mother or possibly go to an [employer]. Pt stated that he does need some type of structure. Pt stated that he could consider the following areas for placement: [address], [place], [place], but prefers more towards the [place] area.

## 2011-06-15 NOTE — Progress Notes (Signed)
  06/15/2011         Time: 1415      Group Topic/Focus: The focus of this group is on discussing various styles of communication and communicating assertively using 'I' (feeling) statements.  Participation Level: Active  Participation Quality: Redirectable  Affect: Appropriate  Cognitive: Oriented  Additional Comments: Patient inappropriate at times when peers were, easily redirectable.   Nicholas Melton 06/15/2011 4:00 PM
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
## 2011-06-15 NOTE — Progress Notes (Signed)
[facility] Group Notes:  (Counselor/Nursing/MHT/Case Management/Adjunct)  06/15/2011 5:49 PM   Type of Therapy:  Processing Group at 11:00 am  Participation Level:  Minimal  Participation Quality:  Attentive  Affect:  Appropriate  Cognitive:  Oriented and alert  Insight:  None shared  Engagement in Group:  Limited  Engagement in Therapy:  Nicholas Melton attended his first group session on unit and was attentive to group process and shared that "no one (in family) understands my problems."  Modes of Intervention:    Summary of Progress/Problems:  [facility] Group Notes:  (Counselor/Nursing/MHT/Case Management/Adjunct)  06/15/2011 5:49 PM   Type of Therapy:  Counseling Group at 1:15 pm  Participation Level:  Minimal  Participation Quality: Attentive  Affect:  Flat  Cognitive:  Oriented  Insight:  None cheered  Engagement in Group:  Minimal  Engagement in Therapy:  Unknown  Modes of Intervention:  Exploration and socialization  Summary of Progress/Problems:  Nicholas Melton came into group at midpoint after seeing the physician; he was attentive to group process yet did not participate in discussion.  Nicholas Melton did question self test on addiction and when scoring was explained he disputed "this must be wrong" Ronda Fairly, LCSWA 06/15/2011 5:49 PM

## 2011-06-15 NOTE — Progress Notes (Signed)
Pt. Has been sleeping intermittently this evening.  Pt. Denies SI/HI and denies A/V hallucinations.  Cooperative and compliant with medications.
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
## 2011-06-16 NOTE — Progress Notes (Signed)
Pt observed in the dayroom watching TV.  Pt reports he had a good day until he received a phone call from his mother which upset him.  He would not elaborate on the conversation, but he did say it was something he could handle.  "Its just something I have to deal with."  Pt denies any withdrawal symptoms at this time.  Pt voices no complaints/concerns.  Denies SI/HI.  Pt plans to follow up with the [facility].  He is going to discharge to his parents home until he can find suitable housing.  Safety maintained with q15 minute checks.

## 2011-06-16 NOTE — Progress Notes (Signed)
Patient ID: Nicholas Melton, male   DOB: 1970/08/21, 41 y.o.   MRN: [id number]   Subjective:  Nicholas Melton was seen in treatment team this morning to discuss his goals for maintaining his sobriety once he leaves [facility].  He reports that he "likes " the librium and its " helping a lot."  States he feels "pretty good." States he has had a decrease in sweats, shaking and headache.  His appetite is improved and he slept 7.5 hours last night. States no SI/HI, denies AH/VH.  Anxiety is a 1-2/ 10 and his depression is rated at 1-2/ 10.  He does ask about medications for anxiety once he is ready to leave.  Objective: Nicholas Melton already has plans to return to his parents house once he has completed his detox.  He would also be interested in IOP, but states he does need a therapist, a psychiatrist, and a "home group." to help with his recovery. Also discussed medications but will wait until his detox is a little further along, as his symptoms of depression and anxiety may be alcohol induced, and are very minimal at this time.  Explained "less is more" philosophy.  Will continue as planned at this time. Without changes.  Rona Ravens. Siddarth Hsiung [facility] 06/16/2011  11:22pm
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
## 2011-06-16 NOTE — Treatment Plan (Signed)
Interdisciplinary Treatment Plan Update (Adult)  Date: 06/16/2011  Time Reviewed: 10:26 AM   Progress in Treatment: Attending groups: Yes Participating in groups: Yes Taking medication as prescribed: Yes Tolerating medication: Yes   Family/Significant other contact made: No  Patient understands diagnosis:  Yes  As evidenced by asking for help with alcoholism Discussing patient identified problems/goals with staff:  Yes  See below Medical problems stabilized or resolved:  Yes Denies suicidal/homicidal ideation: Yes  In tx team Issues/concerns per patient self-inventory:  Yes  Withdrawal symptoms Other:  New problem(s) identified: N/A  Reason for Continuation of Hospitalization: Medication stabilization Withdrawal symptoms  Interventions implemented related to continuation of hospitalization: Librium taper,  Encourage group attendence and participation  Additional comments:  Estimated length of stay: 3 days  Discharge Plan: Stay with parents temporarily while he figures out a sober living situation  New goal(s): N/A  Review of initial/current patient goals per problem list:   1.  Goal(s):Safely detox from alcohol  Met:  No  Target date:4/6  As evidenced [id number] in CIWA score to 0, stable vitals  2.  Goal (s):Identify comprehensive sobriety plan  Met:  No  Target date:4/5  As evidenced [id number] report  3.  Goal(s): Decrease depression  Met:  Yes  Target date:4/3  As evidenced JY:[id number] reports that he has no signs or symptoms of depression  4.  Goal(s):  Met:  Yes  Target date:  As evidenced by:  Attendees: Patient: Nicholas Melton  06/16/2011 10:26 AM  Family:     Physician: Verne Spurr 06/16/2011 10:26 AM   Nursing: Robbie Louis   06/16/2011 10:26 AM   Case Manager:  Richelle Ito, LCSW 06/16/2011 10:26 AM   Counselor:  Ronda Fairly, LCSWA 06/16/2011 10:26 AM   Other:     Other:     Other:     Other:      Scribe for Treatment Team:   Ida Rogue, 06/16/2011 10:26 AM

## 2011-06-16 NOTE — Discharge Planning (Signed)
Pt present in morning d/c planning group. Pt is currently employed at [employer] and would like to be in a structured sober living facility after d/c as well as attend an IOP program. CM will contact T who is an owner of 1/2 houses in the area to speak with pt as a possibility for him. Referral was made to [facility] for IOP program.

## 2011-06-16 NOTE — Progress Notes (Signed)
Patient ID: Nicholas Melton, male   DOB: 1970/12/03, 41 y.o.   MRN: [id number] He has been up and to groups today interacting with peers and staff.  He he depressed at 2 hopeless at 1 denies SI thoughts per self inventory sheet.

## 2011-06-16 NOTE — Progress Notes (Signed)
[facility] Group Notes:  (Counselor/Nursing/MHT/Case Management/Adjunct)  06/16/2011 4:06 PM  Type of Therapy:  1:15PM Group Therapy  Participation Level:  Active  Participation Quality:  Appropriate, Attentive and Sharing  Affect:  Appropriate  Cognitive:  Alert and Appropriate  Insight:  Good  Engagement in Group:  Good  Engagement in Therapy:  Good  Modes of Intervention:  Clarification, Education, Support and Exploration  Summary of Progress/Problems: Patient reports "when good things happen to him he feels like he's on top of the world". Patient stated he also experiences emotions of anger when his "buttons are pressed". When he begins to feel angry patient states he either walks away or shuts down completely. Patient reports he begins to feel hopeless when others ask "if this time is going to be his last time using drugs" and "if this time is going to be different". Patient shared about "having hind sight, woulda, coulda, shouldas".    [name] 06/16/2011, 4:06 PM

## 2011-06-17 DIAGNOSIS — F1020 Alcohol dependence, uncomplicated: Secondary | ICD-10-CM | POA: Diagnosis present
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
## 2011-06-17 NOTE — Progress Notes (Signed)
[facility] Group Notes:  (Counselor/Nursing/MHT/Case Management/Adjunct)  06/17/2011 5:14 PM  Type of Therapy:  Group Therapy at 11:00  Participation Level:  Active  Participation Quality:  Appropriate  Affect:  Appropriate  Cognitive:  Appropriate  Insight:  Limited  Engagement in Group:  Good  Engagement in Therapy:  Good  Modes of Intervention:  Clarification, Socialization and Support  Summary of Progress/Problems:  Nicholas Melton shared multiple times on balance by first dividing days into thirds; 8 hours for sleeping 8 hours for work and 8 hours for everything else. "Need more put stuff in my everything else hours" "I have too much work pressure and too much pressure from family." "Often not being able to live up to others' expectations lead me to disappointment and resentments"   Nicholas Melton 06/17/2011, 5:14 PM
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
## 2011-06-17 NOTE — Progress Notes (Signed)
Pt pleasant and cooperative. Pt attends groups and interact well with peers. Pt was offered support and encouragement. Pt is receptive to treatment and safety maintained on unit.

## 2011-06-17 NOTE — Progress Notes (Signed)
[facility] Group Notes:  (Counselor/Nursing/MHT/Case Management/Adjunct)  06/17/2011 4:25 PM  Type of Therapy:  1:15PM Group Therapy  Participation Level:  Active  Participation Quality:  Appropriate, Attentive and Sharing  Affect:  Appropriate  Cognitive:  Alert and Appropriate  Insight:  Good  Engagement in Group:  Good  Engagement in Therapy:  Good  Modes of Intervention:  Activity, Clarification, Education, Support and Exploration  Summary of Progress/Problems: In discussing life and balance, patient chose two photos to represent what his life would be if it were balanced and a picture to represent his life out of balance. Patient chose two photos of individuals sitting on benches, one in a church and the other in a train station. Patient described one picture as someone sleeping on a bench in a train station, waiting, with no where to turn to represent his life when it was imbalanced. Patient became tearful in discussing his interpretation of this photo, when he applied this event to his own life. Patient then stated the individual sitting on the church bench is also waiting, but on something different. He described the photo as someone who is hopeful and waiting for a higher being.     [name] 06/17/2011, 4:25 PM

## 2011-06-17 NOTE — Progress Notes (Signed)
[facility] MD Progress Note  06/17/2011 5:03 PM  Diagnosis:  Alcohol dependence  ADL's:  Intact  Sleep: Fair  Appetite:  Good  Suicidal Ideation:  no Homicidal Ideation:  no  AEB (as evidenced by):  Mental Status Examination/Evaluation: Objective:  Appearance: Neat  Eye Contact::  Fair  Speech:  Normal Rate  Volume:  Normal  Mood:  Depressed  7-8/10  Affect:  Congruent  Thought Process:  Circumstantial  Orientation:  Full  Thought Content:  WDL  Suicidal Thoughts:  No  Homicidal Thoughts:  No  Memory:  Immediate;   Fair  Judgement:  Impaired  Insight:  Lacking  Psychomotor Activity:  Normal  Concentration:  Fair  Recall:  Fair  Akathisia:  No  Handed:  Right  AIMS (if indicated):     Assets:  Desire for Improvement  Sleep:  Number of Hours: 6.25    Vital Signs:Blood pressure 126/95, pulse 57, temperature 97.5 F (36.4 C), temperature source Oral, resp. rate 16, height 5\' 10"  (1.778 m), weight 65.772 kg (145 lb). Current Medications: Current Facility-Administered Medications  Medication Dose Route Frequency Provider Last Rate Last Dose  . acetaminophen (TYLENOL) tablet 650 mg  650 mg Oral Q6H PRN Mickeal Skinner, MD   650 mg at 06/17/11 1610  . alum & mag hydroxide-simeth (MAALOX/MYLANTA) 200-200-20 MG/5ML suspension 30 mL  30 mL Oral Q4H PRN Mickeal Skinner, MD   30 mL at 06/17/11 9604  . chlordiazePOXIDE (LIBRIUM) capsule 25 mg  25 mg Oral Q6H PRN Mickeal Skinner, MD      . chlordiazePOXIDE (LIBRIUM) capsule 25 mg  25 mg Oral QID Mickeal Skinner, MD   25 mg at 06/16/11 2200   Followed by  . chlordiazePOXIDE (LIBRIUM) capsule 25 mg  25 mg Oral TID Mickeal Skinner, MD   25 mg at 06/17/11 1204   Followed by  . chlordiazePOXIDE (LIBRIUM) capsule 25 mg  25 mg Oral BH-qamhs Mickeal Skinner, MD       Followed by  . chlordiazePOXIDE (LIBRIUM) capsule 25 mg  25 mg Oral Daily Mickeal Skinner, MD      . folic acid (FOLVITE) tablet 1 mg  1 mg Oral Daily Mickeal Skinner, MD   1 mg at  06/17/11 0800  . hydrOXYzine (ATARAX/VISTARIL) tablet 25 mg  25 mg Oral Q6H PRN Mickeal Skinner, MD   25 mg at 06/17/11 0827  . loperamide (IMODIUM) capsule 2-4 mg  2-4 mg Oral PRN Mickeal Skinner, MD      . magnesium hydroxide (MILK OF MAGNESIA) suspension 30 mL  30 mL Oral Daily PRN Mickeal Skinner, MD      . mulitivitamin with minerals tablet 1 tablet  1 tablet Oral Daily Mickeal Skinner, MD   1 tablet at 06/17/11 0800  . nicotine (NICODERM CQ - dosed in mg/24 hours) patch 21 mg  21 mg Transdermal Q0600 Mickeal Skinner, MD   21 mg at 06/17/11 0622  . ondansetron (ZOFRAN-ODT) disintegrating tablet 4 mg  4 mg Oral Q6H PRN Mickeal Skinner, MD      . thiamine (VITAMIN B-1) tablet 100 mg  100 mg Oral Daily Mickeal Skinner, MD   100 mg at 06/17/11 0826  . traZODone (DESYREL) tablet 50 mg  50 mg Oral QHS PRN,MR X 1 Mickeal Skinner, MD   50 mg at 06/17/11 0119    Lab Results: No results found for this or any previous visit (from the past 48 hour(s)).  Physical Findings: AIMS:  , ,  ,  ,  CIWA:  CIWA-Ar Total: 3  COWS:     Treatment Plan Summary: Daily contact with patient to assess and evaluate symptoms and progress in treatment Medication management  Plan: Continue current plan of care no changes at this time.  Psychoeducation done regarding "depression vs. Dissatisfaction".  Pt. Voices dissatisfaction and displeasure regarding his recent breakup.  He has unrealistic expectations regarding medication and how it will change his "depression." Pt. Has issues relating to his unmet goals in life and lack of accepting responsibility for his actions.  It is this provider's opinion that medication will not improve Yaiden' problem.           It appears that he has harbored anger and resentment towards his mother for sending him to live with his father in [place] when he was 9.  While his father was good to him, Nicholas Melton can not get past the resentment he has towards his parents for divorcing and forcing him  to live in [place].        He has tried many medications and reports a "side effect" or "limited effectiveness" to all of them. He states he has a "high side effect profile." This could explain why medications don't seem to work For him.         Will continue to follow without changes at this time. Brooklynn Brandenburg 06/17/2011, 5:03 PM

## 2011-06-18 NOTE — Progress Notes (Signed)
[facility] Group Notes:  (Counselor/Nursing/MHT/Case Management/Adjunct)  06/18/2011 12:54 PM  Type of Therapy:  Group Therapy  Participation Level:  Active  Participation Quality:  Attentive and Sharing  Affect:  Appropriate  Cognitive:  Alert and Oriented  Insight:  Good  Engagement in Group:  Good  Engagement in Therapy:  Good  Modes of Intervention:  Education and Support  Summary of Progress/Problems:  Nicholas Melton shared at several points during educational session on Post Acute Withdrawal Syndrome. "I've had this argument with a functional alcoholic' about PAWS; and they did not understand/believe me." "seems like I've probably relapsed during this phase many times as I did not know exactly what to expect"   Clide Dales 06/18/2011, 12:54 PM

## 2011-06-18 NOTE — Progress Notes (Signed)
Patient's Mother, Magdalene River, was contacted at [phone] and relieved to know that son was doing well.  Ms. Lovenia Shuck reported "need for a correct diagnosis" yet no additional concerns other than what patient had reported.  She was pleased to learn he may enter IOP program at discharge.Clide Dales 06/18/2011 6:04 PM

## 2011-06-18 NOTE — Tx Team (Signed)
Interdisciplinary Treatment Plan Update (Adult)  Date:  06/18/2011  Time Reviewed:  9:30AM  Progress in Treatment: Attending groups:  Yes, per RN report Participating in groups:  Yes Taking medication as prescribed:    Yes Tolerating medication:   Yes Family/Significant other contact made: Continued efforts to contact Mother; 2 failed attempts Patient understands diagnosis:   Yes Discussing patient identified problems/goals with staff:  Yes  Medical problems stabilized or resolved:   Yes Denies suicidal/homicidal ideation:  Yes Issues/concerns per patient self-inventory:   None Other:    New problem(s) identified: No, Describe:    Reason for Continuation of Hospitalization: Other; describe Complete detox  Interventions implemented related to continuation of hospitalization:  Medication monitoring and adjustment, safety checks Q15 min., suicide risk assessment, group therapy, psychoeducation, collateral contact, aftercare planning, ongoing physician assessments, medication education  Additional comments:  Not applicable  Estimated length of stay:  1 day  Discharge Plan:  Discharge 06/19/11 home with parents, to follow up at [facility].  New goal(s):  Not applicable  Review of initial/current patient goals per problem list:   1.  Goal(s):  Decrease depression from admission level to no greater than 3/10 at discharge.  Met:  Yes  Target date:  By Discharge   As evidenced by:  "1" today  2.  Goal(s):  Complete detox safely.  Met:  No  Target date:  By Discharge   As evidenced by:  To finish tomorrow  3.  Goal(s):  Decide if & how to address substance abuse issues at discharge.  Met:  Yes  Target date:  By Discharge   As evidenced by:  Going to IOP at [facility] 06/21/11     Attendees: Patient:  Did not attend   Family:     Physician:    Nursing:     Case Manager:  Ambrose Mantle, LCSW 06/18/2011  Counselor:  Ronda Fairly, LCSW-A 06/18/2011    Other:   Verne Spurr, PA 06/18/2011   Other:   Reyes Ivan, LCSW-A 06/18/2011   Other:   Tanya Nones, MSW-I 06/18/2011   Other:       Scribe for Treatment Team:   Sarina Ser, 06/18/2011,

## 2011-06-18 NOTE — Discharge Planning (Signed)
Pt was present in morning discharge planning group. Pt states that he slept well and is in a better mood this morning since hearing the news about his girlfriend unexpectedly cancelling his car insurance yesterday. Pt states that he slept well last night and is looking forward to d/c tomorrow. Pt will d/c home with parents tomorrow and will f/u at [facility] on 4/8 for the IOP program. Pt rates his depression at a 1 today and his anxiety at a 2. Pt denies SI/HI. No further needs to be addressed for pt at this moment.   Above reported by Tanya Nones, MSW Intern  Ambrose Mantle, LCSW 06/18/2011, 12:41 PM

## 2011-06-18 NOTE — Progress Notes (Signed)
Slept well last nite, appetite is good,energy level is low and ability to pay attention is good, depressed 1/10 and hopeless 1/10, denies Si or Hi, had neck pain earlier but is better now (treated w/Tylenol), eating in the DR. q38min safety checks continues and support offered Safety maintained

## 2011-06-18 NOTE — Progress Notes (Signed)
[facility] Group Notes:  (Counselor/Nursing/MHT/Case Management/Adjunct)  06/18/2011 3:46 PM  Type of Therapy:  1:15PM Group Therapy  Participation Level:  Active  Participation Quality:  Appropriate, Attentive and Sharing  Affect:  Appropriate  Cognitive:  Alert and Appropriate  Insight:  Good  Engagement in Group:  Good  Engagement in Therapy:  Good  Modes of Intervention:  Education and Exploration  Summary of Progress/Problems: Patient was attentive to educational DVD on vulnerability. Patient reported he could relate to the educational video when is mentioned that "you don't get to choose what you want to numb when abusing substances". Patient reported he often deals with vulnerability when he decides to use. Patient seemed to relate well to the thoughts and ideas of his peers.   [name] 06/18/2011, 3:46 PM

## 2011-06-18 NOTE — Progress Notes (Signed)
Patient resting quietly with eyes closed. Respirations even and unlabored. No distress noted. Q 15 minute check continues to maintain safety   

## 2011-06-19 DIAGNOSIS — F411 Generalized anxiety disorder: Secondary | ICD-10-CM

## 2011-06-19 DIAGNOSIS — F1020 Alcohol dependence, uncomplicated: Secondary | ICD-10-CM

## 2011-06-19 DIAGNOSIS — F429 Obsessive-compulsive disorder, unspecified: Secondary | ICD-10-CM

## 2011-06-19 DIAGNOSIS — F3189 Other bipolar disorder: Secondary | ICD-10-CM

## 2011-06-19 MED ORDER — ATENOLOL 25 MG PO TABS: 25.0000 mg | ORAL_TABLET | Freq: Every day | ORAL | Status: DC

## 2011-06-19 MED ORDER — SERTRALINE HCL 25 MG PO TABS: 25.0000 mg | ORAL_TABLET | Freq: Every day | ORAL | Status: DC

## 2011-06-19 MED ORDER — DIPHENHYDRAMINE HCL 12.5 MG/5ML PO LIQD: 25.0000 mg | Freq: Every evening | ORAL | Status: DC | PRN

## 2011-06-19 MED ORDER — NICOTINE 21 MG/24HR TD PT24: 1.0000 | MEDICATED_PATCH | Freq: Every day | TRANSDERMAL | Status: AC

## 2011-06-19 NOTE — Progress Notes (Signed)
[facility] Case Management Discharge Plan:  Will you be returning to the same living situation after discharge: Pt. Will go to live with his parents Olegario Messier and Sung Amabile [phone] At discharge, do you have transportation home?:Yes,   Do you have the ability to pay for your medications:Yes,    Interagency Information:     Release of information consent forms completed and in the chart;  Patient's signature needed at discharge.  Patient to Follow up at:  Follow-up Information    Follow up with [facility] on 06/21/2011. (9:00am)    Contact information:   [address] [place], [place] [address]  [phone]           Patient denies SI/HI:   Yes,      Safety Planning and Suicide Prevention discussed:  Yes,    Barrier to discharge identified:none  Summary and Recommendations: Pt. Will follow up with appointments and stated he had gotten a number from a AA person at meetings at [facility] who will be his sponsor.   Lamar Blinks Barling 06/19/2011, 10:02 AM

## 2011-06-19 NOTE — [facility] Suicide Risk Assessment (Signed)
Suicide Risk Assessment  Discharge Assessment     Demographic factors:  Male;Caucasian;Low socioeconomic status    Current Mental Status Per Nursing Assessment::   On Admission:    At Discharge:     Current Mental Status Per Physician:  Loss Factors: Loss of significant relationship;Financial problems / change in socioeconomic status;Decline in physical health  Historical Factors: Prior suicide attempts;Family history of mental illness or substance abuse  Risk Reduction Factors:      Continued Clinical Symptoms:  Depression:   Comorbid alcohol abuse/dependence Alcohol/Substance Abuse/Dependencies More than one psychiatric diagnosis Previous Psychiatric Diagnoses and Treatments  Discharge Diagnoses:   AXIS I:  Substance Induced Mood Disorder and Alcohol dependence AXIS II:  No diagnosis AXIS III:   Past Medical History  Diagnosis Date  . Hypertension   . Bipolar 2 disorder   . OCD (obsessive compulsive disorder)   . Anxiety   . Depression   . Panic attacks   . Social phobia   . Posttraumatic stress disorder   . Night terrors, adult    AXIS IV:  housing problems, other psychosocial or environmental problems and problems related to social environment AXIS V:  51-60 moderate symptoms  Cognitive Features That Contribute To Risk:  Thought constriction (tunnel vision)    Suicide Risk:  Minimal: No identifiable suicidal ideation.  Patients presenting with no risk factors but with morbid ruminations; may be classified as minimal risk based on the severity of the depressive symptoms  Plan Of Care/Follow-up recommendations:  Activity:  As tolerated Diet:  Regular Other:  Do not use alcohol or drugs  Nicholas Melton 06/19/2011, 1:24 PM

## 2011-06-19 NOTE — Progress Notes (Signed)
Discharge note-Nursing- Pt is appropriate during d/c process. Denies SI. All f/y instruction reviewed and pt verbalized understanding. Nicotine patches from pharmacy given.  All contents from locker #13 returned. Escorted to lobby to awaiting transportation. care

## 2011-06-19 NOTE — Progress Notes (Signed)
[facility] Group Notes:  (Counselor/Nursing/MHT/Case Management/Adjunct)  06/19/2011 4:19 PM  Type of Therapy:  Group Therapy  Participation Level:  Did Not Attend  Neila Gear 06/19/2011, 4:19 PM

## 2011-06-22 NOTE — Progress Notes (Signed)
Patient Discharge Instructions:  Psychiatric Admission Assessment Note Provided,  06/22/2011 After Visit Summary (AVS) Provided,  06/22/2011 Face Sheet Provided, 06/22/2011 Faxed/Sent to the Next Level Care provider:  06/22/2011 Provided Suicide Risk Assessment - Discharge Assessment 06/22/2011  Faxed to [facility] @ [phone]  Wandra Scot, 06/22/2011, 11:43 AM

## 2011-06-27 NOTE — Discharge Summary (Signed)
Physician Discharge Summary Note  Patient:  Nicholas Melton is an 41 y.o., male MRN:  [id number] DOB:  1970/06/08 Patient phone:  [phone] (home)  Patient address:   [address] [place] [address],   Date of Admission:  06/14/2011 Date of Discharge: 06/19/2011  Reason for Admission: detox from alcohol and crisis management  Discharge Diagnoses: Active Problems:  Alcohol dependence  MDD (major depressive disorder)  Alcohol consumption of more than four drinks per day  PTSD (post-traumatic stress disorder)  Homeless   Axis Diagnosis:   AXIS I:  Alcohol dependence continuous use, Bipolar 2 disorder, OCD, GAD, panic attacks, PTSD, R/0 substance induced mood disorder AXIS II:  R/O BPD AXIS III: Hypertension  AXIS lV: Homeless, problems related to primary support group AXIS V:  GAF: 60  Level of Care:  Outpatient  Hospital Course:        Nicholas Melton was admitted for detox from alcohol and crisis management.  He was treated with the standard Librium protocol.  Medical problems were identified and treated.  Home medication was restarted as appropriate.     Improvement was monitored by CIWA scores and patient's daily report of withdrawal symptom reduction. Emotional and mental status was monitored by daily self inventory reports completed by the patient and clinical staff.      The patient was evaluated by the treatment team for stability and plans for continued recovery upon discharge. He was offered further treatment options upon discharge including Residential, IOP, and Outpatient treatment.  The patient's motivation was an integral factor for scheduling further treatment.  Employment, transportation, bed availability, health status, family support, and any pending legal issues were also considered.    Nicholas Melton elected to return home to his parents home and restart out patient treatment at the [facility].    Upon completion of detox the patient was both mentally and medically stable for  discharge.    Consults: None  Significant Diagnostic Studies:  Routine admission labs  Discharge Vitals:   Blood pressure 120/76, pulse 73, temperature 98.1 F (36.7 C), temperature source Oral, resp. rate 16, height 5\' 10"  (1.778 m), weight 65.772 kg (145 lb).  Mental Status Exam: See Mental Status Examination and Suicide Risk Assessment completed by Attending Physician prior to discharge.  Discharge destination:  Home  Is patient on multiple antipsychotic therapies at discharge:  No   Has Patient had three or more failed trials of antipsychotic monotherapy by history:  No Recommended Plan for Multiple Antipsychotic Therapies: not applicable Discharge Orders    Future Orders Please Complete By Expires   Diet - low sodium heart healthy      Increase activity slowly      Discharge instructions      Comments:   Follow up at the [facility] as planned. Take only medication that is prescribed.     Medication List  As of 06/27/2011 11:15 AM   STOP taking these medications         acetaminophen 500 MG tablet      ADVIL PM PO      ALPRAZolam 0.5 MG tablet      atenolol 25 MG tablet      diphenhydrAMINE 12.5 MG/5ML liquid      diphenhydramine-acetaminophen 25-500 MG Tabs      OVER THE COUNTER MEDICATION      oxymetazoline 0.05 % nasal spray      sertraline 25 MG tablet         TAKE these medications  Indication    nicotine 21 mg/24hr patch   Commonly known as: NICODERM CQ - dosed in mg/24 hours   Place 1 patch onto the skin daily at 6 (six) AM. For nicotine withdrawal.            Follow-up Information    Follow up with [facility] on 06/21/2011. (9:00am)    Contact information:   [address] [place], [place] [address]  [phone]           Follow-up recommendations:  Strongly recommended that use of Benzodiazepines in this patient could increase risk of relapse and dependence upon medication.  Comments:  Continued care with psychiatrist  and out patient therapist is recommended.  Signed: Rona Ravens. Rudie Sermons PAC For Dr. Eligah East 06/27/2011, 11:15 AM

## 2011-07-17 NOTE — Discharge Summary (Signed)
I have seen the patient, read the discharge summary and agree with the findings above.  Augustin Coupe, MD

## 2013-09-14 ENCOUNTER — Encounter (HOSPITAL_COMMUNITY): Payer: Self-pay | Admitting: Emergency Medicine

## 2013-09-14 ENCOUNTER — Emergency Department (HOSPITAL_COMMUNITY)
Admission: EM | Admit: 2013-09-14 | Discharge: 2013-09-15 | Disposition: A | Discharge status: Other Acute Inpatient Hospital | Payer: 59 | Attending: Emergency Medicine | Admitting: Emergency Medicine

## 2013-09-14 DIAGNOSIS — F1310 Sedative, hypnotic or anxiolytic abuse, uncomplicated: Secondary | ICD-10-CM | POA: Insufficient documentation

## 2013-09-14 DIAGNOSIS — R45851 Suicidal ideations: Secondary | ICD-10-CM | POA: Insufficient documentation

## 2013-09-14 DIAGNOSIS — F4310 Post-traumatic stress disorder, unspecified: Secondary | ICD-10-CM | POA: Diagnosis present

## 2013-09-14 DIAGNOSIS — F17200 Nicotine dependence, unspecified, uncomplicated: Secondary | ICD-10-CM | POA: Insufficient documentation

## 2013-09-14 DIAGNOSIS — F331 Major depressive disorder, recurrent, moderate: Secondary | ICD-10-CM

## 2013-09-14 DIAGNOSIS — F1020 Alcohol dependence, uncomplicated: Secondary | ICD-10-CM | POA: Diagnosis present

## 2013-09-14 DIAGNOSIS — F10230 Alcohol dependence with withdrawal, uncomplicated: Secondary | ICD-10-CM

## 2013-09-14 DIAGNOSIS — F22 Delusional disorders: Secondary | ICD-10-CM | POA: Insufficient documentation

## 2013-09-14 DIAGNOSIS — Z8659 Personal history of other mental and behavioral disorders: Secondary | ICD-10-CM | POA: Insufficient documentation

## 2013-09-14 DIAGNOSIS — F1410 Cocaine abuse, uncomplicated: Secondary | ICD-10-CM | POA: Insufficient documentation

## 2013-09-14 DIAGNOSIS — F329 Major depressive disorder, single episode, unspecified: Secondary | ICD-10-CM | POA: Diagnosis present

## 2013-09-14 DIAGNOSIS — F309 Manic episode, unspecified: Secondary | ICD-10-CM

## 2013-09-14 DIAGNOSIS — I10 Essential (primary) hypertension: Secondary | ICD-10-CM | POA: Insufficient documentation

## 2013-09-14 LAB — COMPREHENSIVE METABOLIC PANEL
ALBUMIN: 4.9 g/dL (ref 3.5–5.2)
ALT: 25 U/L (ref 0–53)
AST: 35 U/L (ref 0–37)
Alkaline Phosphatase: 92 U/L (ref 39–117)
Anion gap: 21 — ABNORMAL HIGH (ref 5–15)
BUN: 13 mg/dL (ref 6–23)
CALCIUM: 9.7 mg/dL (ref 8.4–10.5)
CO2: 20 meq/L (ref 19–32)
CREATININE: 0.74 mg/dL (ref 0.50–1.35)
Chloride: 94 mEq/L — ABNORMAL LOW (ref 96–112)
GFR calc Af Amer: >90 mL/min (ref >90)
Glucose, Bld: 70 mg/dL (ref 70–99)
Potassium: 4.5 mEq/L (ref 3.7–5.3)
SODIUM: 135 meq/L — AB (ref 137–147)
TOTAL PROTEIN: 8.1 g/dL (ref 6.0–8.3)
Total Bilirubin: 0.8 mg/dL (ref 0.3–1.2)

## 2013-09-14 LAB — RAPID URINE DRUG SCREEN, HOSP PERFORMED
AMPHETAMINES: NOT DETECTED
BENZODIAZEPINES: POSITIVE — AB
Barbiturates: NOT DETECTED
COCAINE: POSITIVE — AB
OPIATES: NOT DETECTED
Tetrahydrocannabinol: NOT DETECTED

## 2013-09-14 LAB — CBC
HCT: 44 % (ref 39.0–52.0)
Hemoglobin: 15.8 g/dL (ref 13.0–17.0)
MCH: 34.8 pg — AB (ref 26.0–34.0)
MCHC: 35.9 g/dL (ref 30.0–36.0)
MCV: 96.9 fL (ref 78.0–100.0)
PLATELETS: 230 10*3/uL (ref 150–400)
RBC: 4.54 MIL/uL (ref 4.22–5.81)
RDW: 14.4 % (ref 11.5–15.5)
WBC: 7.8 10*3/uL (ref 4.0–10.5)

## 2013-09-14 LAB — SALICYLATE LEVEL: Salicylate Lvl: <2 mg/dL — ABNORMAL LOW (ref 2.8–20.0)

## 2013-09-14 LAB — ETHANOL: ALCOHOL ETHYL (B): 19 mg/dL — AB (ref 0–11)

## 2013-09-14 LAB — ACETAMINOPHEN LEVEL: Acetaminophen (Tylenol), Serum: <15 ug/mL (ref 10–30)

## 2013-09-14 MED ORDER — LORAZEPAM 1 MG PO TABS
0.0000 mg | ORAL_TABLET | Freq: Four times a day (QID) | ORAL | Status: DC
  Administered 2013-09-15: 1 mg via ORAL
  Filled 2013-09-14: qty 1

## 2013-09-14 MED ORDER — VITAMIN B-1 100 MG PO TABS
100.0000 mg | ORAL_TABLET | Freq: Every day | ORAL | Status: DC
  Administered 2013-09-15: 100 mg via ORAL
  Filled 2013-09-14: qty 1

## 2013-09-14 MED ORDER — NICOTINE 21 MG/24HR TD PT24
21.0000 mg | MEDICATED_PATCH | Freq: Every day | TRANSDERMAL | Status: DC
  Administered 2013-09-15: 21 mg via TRANSDERMAL
  Filled 2013-09-14: qty 1

## 2013-09-14 MED ORDER — ONDANSETRON HCL 4 MG PO TABS: 4.0000 mg | ORAL_TABLET | Freq: Three times a day (TID) | ORAL | Status: DC | PRN

## 2013-09-14 MED ORDER — LORAZEPAM 1 MG PO TABS: 0.0000 mg | ORAL_TABLET | Freq: Two times a day (BID) | ORAL | Status: DC

## 2013-09-14 MED ORDER — IBUPROFEN 200 MG PO TABS: 600.0000 mg | ORAL_TABLET | Freq: Three times a day (TID) | ORAL | Status: DC | PRN

## 2013-09-14 MED ORDER — THIAMINE HCL 100 MG/ML IJ SOLN: 100.0000 mg | Freq: Every day | INTRAMUSCULAR | Status: DC

## 2013-09-14 NOTE — ED Notes (Signed)
Pt states he stopped Celexa and Seroquel 2weeks ago, since then he has been very paranoid, excessive spending on the Internet. SI with vague plan to "jump off something" denies HI

## 2013-09-14 NOTE — ED Provider Notes (Signed)
CSN: [id number]     Arrival date & time 09/14/13  2120 History  This chart was scribed for non-physician provider Jaynie Crumbleatyana Marilou Barnfield, PA-C, working with Shanna CiscoMegan E Docherty, MD by Phillis HaggisGabriella Gaje, ED Scribe. This patient was seen in room WTR3/WLPT3 and patient care was started at 9:55 PM.     Chief Complaint  Patient presents with  . Paranoid  . Suicidal   The history is provided by the patient and a friend. No language interpreter was used.   HPI Comments: Nicholas Melton is a 43 y.o. male with a history of bipolar disorder, anxiety, and depression who presents to the Emergency Department complaining of paranoia and SI onset 2 weeks ago. He was previously taking Celexa and Seroquel but had manic episodes and has stopped taking it about two weeks ago. He reports that he has been drinking excessively, and using cocaine. He reports that his cocaine use is not common. He also states that he has been taking Klonopin but it is not prescribed to him. He reports episodes of mania where he cannot sleep, has racing thoughts, attends bars late at night, stopped going to work, putting himself into dangerous situations. His friend says that he is having much more extreme episodes than he has had in the past. She states that he asked her to write a eulogy for him because he "won't be here much longer." He reports a history of HTN and other psychological disorders.  He reports that he has previously been in the ED for detox. His friend reports that he wants to be able to receive treatment so that he can get better. He reports that he smokes one PPD.   Past Medical History  Diagnosis Date  . Hypertension   . Bipolar 2 disorder   . OCD (obsessive compulsive disorder)   . Anxiety   . Depression   . Panic attacks   . Social phobia   . Posttraumatic stress disorder   . Night terrors, adult    Past Surgical History  Procedure Laterality Date  . Vasectomy    . Thyroidectomy     No family history on file. History   Substance Use Topics  . Smoking status: Current Every Day Smoker  . Smokeless tobacco: Not on file  . Alcohol Use: 60.0 oz/week    100 Cans of beer per week    Review of Systems  Psychiatric/Behavioral: Positive for suicidal ideas (plan to "jump off something"), behavioral problems and sleep disturbance.  All other systems reviewed and are negative.   Allergies  Review of patient's allergies indicates no known allergies.  Home Medications   Prior to Admission medications   Not on File   BP 129/78  Pulse 107  Temp(Src) 97.9 F (36.6 C) (Oral)  Resp 16  Ht 6' (1.829 m)  Wt 155 lb (70.308 kg)  BMI 21.02 kg/m2  SpO2 100% Physical Exam  Nursing note and vitals reviewed. Constitutional: He is oriented to person, place, and time. He appears well-developed and well-nourished. No distress.  HENT:  Head: Normocephalic and atraumatic.  Eyes: EOM are normal.  Neck: Normal range of motion. Neck supple.  Cardiovascular: Normal rate, regular rhythm and normal heart sounds.  Exam reveals no gallop and no friction rub.   No murmur heard. Pulmonary/Chest: Effort normal and breath sounds normal. No respiratory distress. He has no wheezes. He has no rales. He exhibits no tenderness.  Musculoskeletal: Normal range of motion.  Neurological: He is alert and oriented to person, place,  and time.  Skin: Skin is warm and dry.  Psychiatric: His behavior is normal.  Currently calm and cooperative. Has suicidal ideations.     ED Course  Procedures (including critical care time) DIAGNOSTIC STUDIES: Oxygen Saturation is 100% on room air, normal by my interpretation.    COORDINATION OF CARE: 10:01 PM-Discussed treatment plan which includes detox with pt at bedside and pt agreed to plan.   Labs Review Labs Reviewed  CBC - Abnormal; Notable for the following:    MCH 34.8 (*)    All other components within normal limits  COMPREHENSIVE METABOLIC PANEL - Abnormal; Notable for the following:     Sodium 135 (*)    Chloride 94 (*)    Anion gap 21 (*)    All other components within normal limits  ETHANOL - Abnormal; Notable for the following:    Alcohol, Ethyl (B) 19 (*)    All other components within normal limits  SALICYLATE LEVEL - Abnormal; Notable for the following:    Salicylate Lvl <2.0 (*)    All other components within normal limits  URINE RAPID DRUG SCREEN (HOSP PERFORMED) - Abnormal; Notable for the following:    Cocaine POSITIVE (*)    Benzodiazepines POSITIVE (*)    All other components within normal limits  ACETAMINOPHEN LEVEL    Imaging Review No results found.   EKG Interpretation None      MDM   Final diagnoses:  Mania  Suicidal ideation   Pt in ED requesting inpatient treatment for depression, paranoid behavior, mania, SI. He is currently calm and cooperative. He is here voluntarily. Last alcoholic drink just minutes before coming here. Denies HI.   Will get med clearance labs and TTS consult.   Filed Vitals:   09/14/13 2135 09/15/13 0620  BP: 129/78 100/60  Pulse: 107 66  Temp: 97.9 F (36.6 C) 97.4 F (36.3 C)  TempSrc: Oral Oral  Resp: 16 16  Height: 6' (1.829 m)   Weight: 155 lb (70.308 kg)   SpO2: 100% 98%    I personally performed the services described in this documentation, which was scribed in my presence. The recorded information has been reviewed and is accurate.    Lottie Musselatyana A Supreme Rybarczyk, PA-C 09/15/13 [phone]

## 2013-09-15 MED ORDER — CITALOPRAM HYDROBROMIDE 10 MG PO TABS
10.0000 mg | ORAL_TABLET | Freq: Every day | ORAL | Status: DC
  Filled 2013-09-15: qty 1

## 2013-09-15 MED ORDER — TRAZODONE HCL 50 MG PO TABS
50.0000 mg | ORAL_TABLET | Freq: Every evening | ORAL | Status: DC | PRN
Start: 2013-09-15 — End: 2013-09-15

## 2013-09-15 MED ORDER — QUETIAPINE FUMARATE 25 MG PO TABS: 25.0000 mg | ORAL_TABLET | Freq: Every day | ORAL | Status: DC
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
## 2013-09-15 NOTE — ED Notes (Signed)
Patient provided with suicide safety plan sheet and community resources as part of provided psychoeducational materials.

## 2013-09-15 NOTE — ED Notes (Signed)
[facility] here to [facility]. Belongings bag x3 given to driver. Ambulatory without difficulty. No complaints voiced. States slight body aches but nothing serious. Items secured with security returned.

## 2013-09-15 NOTE — BH Assessment (Signed)
[facility]   This clinician informed by TTS clinician Ala DachFord that patient had been accepted [facility] by Dr. Les Pouarlton there.  Patient is voluntary.  This clinician talked to patient and informed him of being accepted and that this was voluntary.  Patient said that he understood and that he would sign himself in voluntarily.  Dr. Rhunette CroftNanavati was informed of acceptance to [facility] and will complete the emtala form.  [facility] as arranged by nursing staff.

## 2013-09-15 NOTE — BH Assessment (Signed)
Received call from CrimoraJackie at [facility] who said Pt has been accepted by Dr. Les Pouarlton and bed is available. Please call RN report when Pt is ready for transport to [phone].  Harlin RainFord Ellis Ria CommentWarrick Jr, LPC, [facility] Triage Specialist [phone]

## 2013-09-15 NOTE — BH Assessment (Signed)
Per Binnie RailJoann Glover, [facility] at [facility], adult it is currently at capacity. Contacted the following facilities for placement:   BED AVAILABLE, FAXED CLINICAL INFORMATION:  [facility], per [facility]  [facility], per Darius BumpSue  [facility], per [facility]  [facility], per [facility]  [facility], per [facility]  [facility], per [facility]  [facility], per [facility]  [facility], per [facility]  [facility], per Delfino LovettKim  [facility], per [facility]  [facility], per [facility]  [facility], per Jonah   AT CAPACITY: [facility], per [facility]  [facility], per [facility]  [facility], per [facility]  [facility], per [facility]  [facility], Per Okey Dupreose   DECLINED:  [facility]   [address] Ellis Patsy BaltimoreWarrick Jr, WisconsinLPC, [facility] Triage Specialist [phone]

## 2013-09-15 NOTE — ED Provider Notes (Signed)
Medical screening examination/treatment/procedure(s) were performed by non-physician practitioner and as supervising physician I was immediately available for consultation/collaboration.  Megan E Docherty, MD 09/15/13 1116 

## 2013-11-21 ENCOUNTER — Encounter (HOSPITAL_COMMUNITY): Payer: Self-pay | Admitting: Emergency Medicine

## 2013-11-21 ENCOUNTER — Emergency Department (HOSPITAL_COMMUNITY)
Admission: EM | Admit: 2013-11-21 | Discharge: 2013-11-22 | Disposition: A | Discharge status: Home / Self Care | Payer: 59 | Attending: Emergency Medicine | Admitting: Emergency Medicine

## 2013-11-21 DIAGNOSIS — I10 Essential (primary) hypertension: Secondary | ICD-10-CM | POA: Insufficient documentation

## 2013-11-21 DIAGNOSIS — F331 Major depressive disorder, recurrent, moderate: Secondary | ICD-10-CM

## 2013-11-21 DIAGNOSIS — F319 Bipolar disorder, unspecified: Secondary | ICD-10-CM | POA: Insufficient documentation

## 2013-11-21 DIAGNOSIS — Z79899 Other long term (current) drug therapy: Secondary | ICD-10-CM | POA: Insufficient documentation

## 2013-11-21 DIAGNOSIS — R45851 Suicidal ideations: Secondary | ICD-10-CM

## 2013-11-21 DIAGNOSIS — F1010 Alcohol abuse, uncomplicated: Secondary | ICD-10-CM | POA: Insufficient documentation

## 2013-11-21 DIAGNOSIS — Z008 Encounter for other general examination: Secondary | ICD-10-CM | POA: Insufficient documentation

## 2013-11-21 DIAGNOSIS — F332 Major depressive disorder, recurrent severe without psychotic features: Secondary | ICD-10-CM | POA: Insufficient documentation

## 2013-11-21 DIAGNOSIS — F17200 Nicotine dependence, unspecified, uncomplicated: Secondary | ICD-10-CM | POA: Insufficient documentation

## 2013-11-21 DIAGNOSIS — E871 Hypo-osmolality and hyponatremia: Secondary | ICD-10-CM | POA: Insufficient documentation

## 2013-11-21 LAB — COMPREHENSIVE METABOLIC PANEL
ALBUMIN: 4.4 g/dL (ref 3.5–5.2)
ALK PHOS: 72 U/L (ref 39–117)
ALT: 20 U/L (ref 0–53)
AST: 31 U/L (ref 0–37)
Anion gap: 16 — ABNORMAL HIGH (ref 5–15)
BILIRUBIN TOTAL: 0.4 mg/dL (ref 0.3–1.2)
BUN: 6 mg/dL (ref 6–23)
CHLORIDE: 83 meq/L — AB (ref 96–112)
CO2: 21 mEq/L (ref 19–32)
Calcium: 8.9 mg/dL (ref 8.4–10.5)
Creatinine, Ser: 0.66 mg/dL (ref 0.50–1.35)
GFR calc Af Amer: >90 mL/min (ref >90)
GFR calc non Af Amer: >90 mL/min (ref >90)
Glucose, Bld: 66 mg/dL — ABNORMAL LOW (ref 70–99)
Potassium: 3.7 mEq/L (ref 3.7–5.3)
Sodium: 120 mEq/L — CL (ref 137–147)
Total Protein: 7.2 g/dL (ref 6.0–8.3)

## 2013-11-21 LAB — CBC
HEMATOCRIT: 37.2 % — AB (ref 39.0–52.0)
Hemoglobin: 13.7 g/dL (ref 13.0–17.0)
MCH: 33.7 pg (ref 26.0–34.0)
MCHC: 36.8 g/dL — ABNORMAL HIGH (ref 30.0–36.0)
MCV: 91.6 fL (ref 78.0–100.0)
PLATELETS: 266 10*3/uL (ref 150–400)
RBC: 4.06 MIL/uL — ABNORMAL LOW (ref 4.22–5.81)
RDW: 12.9 % (ref 11.5–15.5)
WBC: 10.9 10*3/uL — AB (ref 4.0–10.5)

## 2013-11-21 LAB — ACETAMINOPHEN LEVEL

## 2013-11-21 LAB — RAPID URINE DRUG SCREEN, HOSP PERFORMED
Amphetamines: NOT DETECTED
BARBITURATES: NOT DETECTED
BENZODIAZEPINES: NOT DETECTED
COCAINE: NOT DETECTED
Opiates: NOT DETECTED
TETRAHYDROCANNABINOL: NOT DETECTED

## 2013-11-21 LAB — ETHANOL: Alcohol, Ethyl (B): 127 mg/dL — ABNORMAL HIGH (ref 0–11)

## 2013-11-21 LAB — SALICYLATE LEVEL: Salicylate Lvl: <2 mg/dL — ABNORMAL LOW (ref 2.8–20.0)

## 2013-11-21 MED ORDER — LURASIDONE HCL 20 MG PO TABS
60.0000 mg | ORAL_TABLET | Freq: Every day | ORAL | Status: DC
  Administered 2013-11-21 – 2013-11-22 (×2): 60 mg via ORAL
  Filled 2013-11-21 (×2): qty 3

## 2013-11-21 MED ORDER — ONDANSETRON HCL 4 MG PO TABS: 4.0000 mg | ORAL_TABLET | Freq: Three times a day (TID) | ORAL | Status: DC | PRN

## 2013-11-21 MED ORDER — ZOLPIDEM TARTRATE 5 MG PO TABS: 5.0000 mg | ORAL_TABLET | Freq: Every evening | ORAL | Status: DC | PRN

## 2013-11-21 MED ORDER — SODIUM CHLORIDE 1 G PO TABS
2.0000 g | ORAL_TABLET | Freq: Once | ORAL | Status: AC
  Administered 2013-11-21: 2 g via ORAL
  Filled 2013-11-21: qty 2

## 2013-11-21 MED ORDER — NICOTINE 21 MG/24HR TD PT24
21.0000 mg | MEDICATED_PATCH | Freq: Every day | TRANSDERMAL | Status: DC
  Administered 2013-11-21 – 2013-11-22 (×2): 21 mg via TRANSDERMAL
  Filled 2013-11-21 (×2): qty 1

## 2013-11-21 MED ORDER — IBUPROFEN 200 MG PO TABS: 600.0000 mg | ORAL_TABLET | Freq: Three times a day (TID) | ORAL | Status: DC | PRN

## 2013-11-21 MED ORDER — SODIUM CHLORIDE 0.9 % IV BOLUS (SEPSIS): 1000.0000 mL | Freq: Once | INTRAVENOUS | Status: DC
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
## 2013-11-21 NOTE — ED Notes (Signed)
Bed: WGN56 Expected date: 11/21/13 Expected time:  Means of arrival:  Comments: [name]

## 2013-11-21 NOTE — ED Notes (Signed)
Belongings bag and a black bag placed in [place] [address].

## 2013-11-21 NOTE — ED Notes (Signed)
Patient states he has been having thoughts of harming himself. Patient states he thinks about jumping off of a bridge. Patient states, " I think about about but I can't do that to my mom". Patient states he has not taken his medication in 4 days. Patient  denies Hearing any voices. Patient states he consume alcohol within the last hour. Patient denies any drug use.

## 2013-11-21 NOTE — ED Notes (Signed)
Critical lab result received. Pt was transferred back to main ED for further assessment.

## 2013-11-21 NOTE — ED Notes (Signed)
Bed: WBH41 Expected date:  Expected time:  Means of arrival:  Comments: Triage 4 

## 2013-11-21 NOTE — ED Provider Notes (Signed)
Medical screening examination/treatment/procedure(s) were performed by non-physician practitioner and as supervising physician I was immediately available for consultation/collaboration.   EKG Interpretation None        Rebekka Lobello N Haydyn Girvan, DO 11/21/13 2313 

## 2013-11-22 DIAGNOSIS — R45851 Suicidal ideations: Secondary | ICD-10-CM

## 2013-11-22 DIAGNOSIS — F1010 Alcohol abuse, uncomplicated: Secondary | ICD-10-CM

## 2013-11-22 DIAGNOSIS — F331 Major depressive disorder, recurrent, moderate: Secondary | ICD-10-CM

## 2013-11-22 LAB — SODIUM: Sodium: 129 mEq/L — ABNORMAL LOW (ref 137–147)

## 2013-11-22 NOTE — ED Notes (Signed)
Up to the bathroom 

## 2013-11-22 NOTE — ED Notes (Signed)
Dr Tiburcio Pea updated concerning Na-- pt OK to dc home .

## 2013-11-22 NOTE — Progress Notes (Signed)
CSW met with pt to discuss after care plan. Pt has follow-up appointment with psych MD next Monday.  Rochele Pages,     ED CSW  phone: [phone] 12:41pm

## 2013-11-22 NOTE — BH Assessment (Signed)
Tele Assessment Note   Nicholas Melton is a 43 y.o. male who voluntarily presents to [facility] with SI/SA/Depression/Anxiety.  Pt reports the following: he's been SI x3-4days with a plan to jump from a bridge.  Pt says SI was triggered by: (1) work-related issues; (2) current living arrangements; (3) financial issues; (4) personal relationships; (5) no utilities in the home he just moved in.  Pt admits 2 previous SI attempts by overdose and cutting wrists in 2003. Pt denies HI/AVH.  Pt says he's been off his medications for approx 4 days because he doesn't feel like they are working to control his mood and he is self-medicating by drinking.  Pt says he's been sober x55mos and starting drinking 8-24oz beers daily, his last intake was 11/21/13, he drank 8-12oz beers.  Pt continues to endorse SI, stating--"I don't feel like I have any options, I'm hopeless".  Pt has past inpt admissions: [facility](6578,4696), [facility](8413), [place](2003) and [place](1.64yrs ago).    Axis I: Alcohol Abuse and Bipolar, Depressed Axis II: Deferred Axis III:  Past Medical History  Diagnosis Date  . Hypertension   . Bipolar 2 disorder   . OCD (obsessive compulsive disorder)   . Anxiety   . Depression   . Panic attacks   . Social phobia   . Posttraumatic stress disorder   . Night terrors, adult    Axis IV: economic problems, housing problems, occupational problems, other psychosocial or environmental problems, problems related to social environment and problems with primary support group Axis V: 31-40 impairment in reality testing  Past Medical History:  Past Medical History  Diagnosis Date  . Hypertension   . Bipolar 2 disorder   . OCD (obsessive compulsive disorder)   . Anxiety   . Depression   . Panic attacks   . Social phobia   . Posttraumatic stress disorder   . Night terrors, adult     Past Surgical History  Procedure Laterality Date  . Vasectomy    . Thyroidectomy      Family History: History reviewed. No  pertinent family history.  Social History:  reports that he has been smoking.  He does not have any smokeless tobacco history on file. He reports that he drinks about 60 ounces of alcohol per week. He reports that he uses illicit drugs (Cocaine).  Additional Social History:  Alcohol / Drug Use Pain Medications: None  Prescriptions: See MAR  Over the Counter: None  History of alcohol / drug use?: Yes Longest period of sobriety (when/how long): None  Negative Consequences of Use: Work / School;Personal relationships;Financial Withdrawal Symptoms: Other (Comment) (No e/d sxs a this time ) Substance #1 Name of Substance 1: Alcohol  1 - Age of First Use: Teens  1 - Amount (size/oz): 8-24oz 1 - Frequency: Daily  1 - Duration: 3-4 Days  1 - Last Use / Amount: 11/21/13  CIWA: CIWA-Ar BP: 106/61 mmHg Pulse Rate: 69 COWS:    PATIENT STRENGTHS: (choose at least two) Ability for insight Work skills  Allergies:  Allergies  Allergen Reactions  . Gabapentin Nausea And Vomiting    lethargic    Home Medications:  (Not in a hospital admission)  OB/GYN Status:  No LMP for male patient.  General Assessment Data Location of Assessment: [facility] Is this a Tele or Face-to-Face Assessment?: Tele Assessment Is this an Initial Assessment or a Re-assessment for this encounter?: Initial Assessment Living Arrangements: Alone Can pt return to current living arrangement?: Yes Admission Status: Voluntary Is patient capable  of signing voluntary admission?: Yes Transfer from: Acute Hospital Referral Source: MD  Medical Screening Exam [facility] Walk-in ONLY) Medical Exam completed: No Reason for MSE not completed: Other: (None )  [facility] Crisis Care Plan Living Arrangements: Alone Name of Psychiatrist: [facility]  Name of Therapist: None   Education Status Is patient currently in school?: No Current Grade: None  Highest grade of school patient has completed: None  Name of school: None  Contact  person: None   Risk to self with the past 6 months Suicidal Ideation: Yes-Currently Present Suicidal Intent: Yes-Currently Present Is patient at risk for suicide?: Yes Suicidal Plan?: Yes-Currently Present Specify Current Suicidal Plan: Jump off bridge  Access to Means: Yes Specify Access to Suicidal Means: Bridge(s)--various locations  What has been your use of drugs/alcohol within the last 12 months?: Abusing--Alcohol Previous Attempts/Gestures: Yes How many times?: 2 Other Self Harm Risks: None  Triggers for Past Attempts: Unpredictable Intentional Self Injurious Behavior: None Family Suicide History: No Recent stressful life event(s): Financial Problems;Other (Comment) (Living arrangements, personal relationships, work-related ) Persecutory voices/beliefs?: No Depression: Yes Depression Symptoms: Loss of interest in usual pleasures;Feeling worthless/self pity;Fatigue;Insomnia;Despondent Substance abuse history and/or treatment for substance abuse?: Yes Suicide prevention information given to non-admitted patients: Not applicable  Risk to Others within the past 6 months Homicidal Ideation: No Thoughts of Harm to Others: No Current Homicidal Intent: No Current Homicidal Plan: No Access to Homicidal Means: No Identified Victim: None  History of harm to others?: No Assessment of Violence: None Noted Violent Behavior Description: None  Does patient have access to weapons?: No Criminal Charges Pending?: No Does patient have a court date: No  Psychosis Hallucinations: None noted Delusions: None noted  Mental Status Report Appear/Hygiene: Disheveled;In scrubs Eye Contact: Fair Motor Activity: Unremarkable Speech: Logical/coherent;Soft Level of Consciousness: Alert Mood: Anxious;Depressed;Sad Affect: Depressed;Anxious;Sad Anxiety Level: Minimal Thought Processes: Coherent;Relevant Judgement: Impaired Orientation: Person;Place;Time;Situation Obsessive Compulsive  Thoughts/Behaviors: None  Cognitive Functioning Concentration: Decreased Memory: Recent Intact;Remote Intact IQ: Average Insight: Poor Impulse Control: Poor Appetite: Fair Weight Loss: 0 Weight Gain: 0 Sleep: Decreased Total Hours of Sleep: 4 Vegetative Symptoms: None  ADLScreening [facility] Assessment Services) Patient's cognitive ability adequate to safely complete daily activities?: Yes Patient able to express need for assistance with ADLs?: Yes Independently performs ADLs?: Yes (appropriate for developmental age)  Prior Inpatient Therapy Prior Inpatient Therapy: Yes Prior Therapy Dates: 2003,2013 Prior Therapy Facilty/Provider(s): [facility]  Reason for Treatment: SA/SI/Depression/Detox   Prior Outpatient Therapy Prior Outpatient Therapy: Yes Prior Therapy Dates: Current  Prior Therapy Facilty/Provider(s): [facility]  Reason for Treatment: Med mgt   ADL Screening (condition at time of admission) Patient's cognitive ability adequate to safely complete daily activities?: Yes Is the patient deaf or have difficulty hearing?: No Does the patient have difficulty seeing, even when wearing glasses/contacts?: No Does the patient have difficulty concentrating, remembering, or making decisions?: Yes Patient able to express need for assistance with ADLs?: Yes Does the patient have difficulty dressing or bathing?: No Independently performs ADLs?: Yes (appropriate for developmental age) Does the patient have difficulty walking or climbing stairs?: No Weakness of Legs: None Weakness of Arms/Hands: None  Home Assistive Devices/Equipment Home Assistive Devices/Equipment: Eyeglasses  Therapy Consults (therapy consults require a physician order) PT Evaluation Needed: No OT Evalulation Needed: No SLP Evaluation Needed: No Abuse/Neglect Assessment (Assessment to be complete while patient is alone) Physical Abuse: Denies Verbal Abuse: Denies Sexual Abuse: Denies Exploitation of  patient/patient's resources: Denies Self-Neglect: Denies Values / Beliefs Cultural Requests During  Hospitalization: None Spiritual Requests During Hospitalization: None Consults Spiritual Care Consult Needed: No Social Work 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) Does patient have an advance directive?: No Would patient like information on creating an advanced directive?: No - patient declined information Nutrition Screen- [facility] Patient's home diet: Regular  Additional Information 1:1 In Past 12 Months?: No CIRT Risk: No Elopement Risk: No Does patient have medical clearance?: Yes     Disposition:  Disposition Initial Assessment Completed for this Encounter: Yes Disposition of Patient: Inpatient treatment program;Referred to (Per Janann August, NP recommends inpt admission ) Type of inpatient treatment program: Adult Patient referred to: Other (Comment) (Per Janann August, NP recommends inpt admission )  Murrell Redden 11/22/2013 2:34 AM

## 2013-11-22 NOTE — [facility] Suicide Risk Assessment (Signed)
Suicide Risk Assessment  Discharge Assessment     Demographic Factors:  Male, Low socioeconomic status, Living alone and Unemployed  Total Time spent with patient: 30 minutes  Psychiatric Specialty Exam:     Blood pressure 104/64, pulse 56, temperature 98.4 F (36.9 C), temperature source Oral, resp. rate 18, height 6' (1.829 m), weight 72.576 kg (160 lb), SpO2 97.00%.Body mass index is 21.7 kg/(m^2).  General Appearance: Casual  Eye Contact::  Good  Speech:  Clear and Coherent  Volume:  Normal  Mood:  Depressed  Affect:  Appropriate  Thought Process:  Coherent and Logical  Orientation:  Full (Time, Place, and Person)  Thought Content:  Negative  Suicidal Thoughts:  No  Homicidal Thoughts:  No  Memory:  Immediate;   Good Recent;   Good Remote;   Good  Judgement:  Intact  Insight:  Fair  Psychomotor Activity:  Normal  Concentration:  Good  Recall:  Good  Fund of Knowledge:Good  Language: Good  Akathisia:  Negative  Handed:  Right  AIMS (if indicated):     Assets:  Communication Skills Desire for Improvement  Sleep:       Musculoskeletal: Strength & Muscle Tone: within normal limits Gait & Station: normal Patient leans: N/A   Mental Status Per Nursing Assessment::   On Admission:     Current Mental Status by Physician: NA  Loss Factors: Decrease in vocational status and Financial problems/change in socioeconomic status  Historical Factors: Prior suicide attempts  Risk Reduction Factors:   Sense of responsibility to family  Continued Clinical Symptoms:  Severe Anxiety and/or Agitation  Cognitive Features That Contribute To Risk:  Closed-mindedness    Suicide Risk:  Mild:  Suicidal ideation of limited frequency, intensity, duration, and specificity.  There are no identifiable plans, no associated intent, mild dysphoria and related symptoms, good self-control (both objective and subjective assessment), few other risk factors, and identifiable  protective factors, including available and accessible social support.  Discharge Diagnoses:   AXIS I:  major depression recurrent moderate AXIS II:  Deferred AXIS III:   Past Medical History  Diagnosis Date  . Hypertension   . Bipolar 2 disorder   . OCD (obsessive compulsive disorder)   . Anxiety   . Depression   . Panic attacks   . Social phobia   . Posttraumatic stress disorder   . Night terrors, adult    AXIS IV:  economic problems, housing problems and occupational problems AXIS V:  51-60 moderate symptoms  Plan Of Care/Follow-up recommendations:  Activity:  resume usual activity Diet:  resume usual diet  Is patient on multiple antipsychotic therapies at discharge:  No   Has Patient had three or more failed trials of antipsychotic monotherapy by history:  No  Recommended Plan for Multiple Antipsychotic Therapies: NA    TAYLOR,GERALD D 11/22/2013, 12:07 PM

## 2013-11-22 NOTE — ED Notes (Signed)
Sandwich/soda given 

## 2013-11-22 NOTE — ED Notes (Signed)
Dr taylor and shuvon into see 

## 2013-11-22 NOTE — ED Notes (Addendum)
Written dc instructions reviewed with pt.  Pt encouraged to take medications as directed, and follow up with his MD and to return for suicidal/homicidal thoughts/ urges-pt denies si/hi/avh on dc.  Pt also encouraged to use resources provided by CSW.  Pt  Verbalized understanding.  Pt ambulatory w/o difficulty to dc window w/ mHt, belongings returned after leaving the unit.  Bus pass given.

## 2013-11-22 NOTE — Consult Note (Signed)
[place] Psychiatry Consult   Reason for Consult:  Suicidal thoughts Referring Physician:  ER MD  Nicholas Melton is an 43 y.o. male. Total Time spent with patient: 30 minutes  Assessment: AXIS I:  Alcohol Abuse and major depression recurrent moderate AXIS II:  Deferred AXIS III:   Past Medical History  Diagnosis Date  . Hypertension   . Bipolar 2 disorder   . OCD (obsessive compulsive disorder)   . Anxiety   . Depression   . Panic attacks   . Social phobia   . Posttraumatic stress disorder   . Night terrors, adult    AXIS IV:  economic problems, housing problems, occupational problems, problems related to social environment and problems with primary support group AXIS V:  51-60 moderate symptoms  Plan:  No evidence of imminent risk to self or others at present.    Subjective:   Nicholas Melton is a 43 y.o. male patient admitted with suicidal thoughts.  HPI:  Nicholas Melton has been having suicidal thoughts this week.  Says it has been "a pretty rough week".  Was in a men's transition home and then moved into an apartment but the neighborhood is rough so he says he will not go back there.  That makes him homeless again.  He has started drinking for the last few days.  Last detox was 2 months ago. He says he has social phobia so AA/NA is out of the question.  Today he is not having suicidal thoughts but still feels some hopelessness.  Says he is willing to go to the [facility] to see if they can help him find a better place to live once he is discharged. HPI Elements:   Location:  suicidal thoughts. Quality:  could not kill himself he says because he does not want to hurt his mother. Severity:  feels some hopelessness but is willing to keep trying to get help. Timing:  apartment did not work out so he is homeles again. Duration:  weeks this current episode. Context:  as above.  Past Psychiatric History: Past Medical History  Diagnosis Date  . Hypertension   . Bipolar 2 disorder   .  OCD (obsessive compulsive disorder)   . Anxiety   . Depression   . Panic attacks   . Social phobia   . Posttraumatic stress disorder   . Night terrors, adult     reports that he has been smoking.  He does not have any smokeless tobacco history on file. He reports that he drinks about 60 ounces of alcohol per week. He reports that he uses illicit drugs (Cocaine). History reviewed. No pertinent family history. Family History Substance Abuse: No Family Supports: No (None reported ) Living Arrangements: Alone Can pt return to current living arrangement?: Yes Abuse/Neglect [facility]) Physical Abuse: Denies Verbal Abuse: Denies Sexual Abuse: Denies Allergies:   Allergies  Allergen Reactions  . Gabapentin Nausea And Vomiting    lethargic    ACT Assessment Complete:  Yes:    Educational Status    Risk to Self: Risk to self with the past 6 months Suicidal Ideation: Yes-Currently Present Suicidal Intent: Yes-Currently Present Is patient at risk for suicide?: Yes Suicidal Plan?: Yes-Currently Present Specify Current Suicidal Plan: Jump off bridge  Access to Means: Yes Specify Access to Suicidal Means: Bridge(s)--various locations  What has been your use of drugs/alcohol within the last 12 months?: Abusing--Alcohol Previous Attempts/Gestures: Yes How many times?: 2 Other Self Harm Risks: None  Triggers for Past Attempts:  Unpredictable Intentional Self Injurious Behavior: None Family Suicide History: No Recent stressful life event(s): Financial Problems;Other (Comment) (Living arrangements, personal relationships, work-related ) Persecutory voices/beliefs?: No Depression: Yes Depression Symptoms: Loss of interest in usual pleasures;Feeling worthless/self pity;Fatigue;Insomnia;Despondent Substance abuse history and/or treatment for substance abuse?: Yes Suicide prevention information given to non-admitted patients: Not applicable  Risk to Others: Risk to Others within the past 6  months Homicidal Ideation: No Thoughts of Harm to Others: No Current Homicidal Intent: No Current Homicidal Plan: No Access to Homicidal Means: No Identified Victim: None  History of harm to others?: No Assessment of Violence: None Noted Violent Behavior Description: None  Does patient have access to weapons?: No Criminal Charges Pending?: No Does patient have a court date: No  Abuse: Abuse/Neglect Assessment (Assessment to be complete while patient is alone) Physical Abuse: Denies Verbal Abuse: Denies Sexual Abuse: Denies Exploitation of patient/patient's resources: Denies Self-Neglect: Denies  Prior Inpatient Therapy: Prior Inpatient Therapy Prior Inpatient Therapy: Yes Prior Therapy Dates: 2003,2013 Prior Therapy Facilty/Provider(s): [place] [place]  Reason for Treatment: SA/SI/Depression/Detox   Prior Outpatient Therapy: Prior Outpatient Therapy Prior Outpatient Therapy: Yes Prior Therapy Dates: Current  Prior Therapy Facilty/Provider(s): [facility]  Reason for Treatment: Med mgt   Additional Information: Additional Information 1:1 In Past 12 Months?: No CIRT Risk: No Elopement Risk: No Does patient have medical clearance?: Yes                  Objective: Blood pressure 104/64, pulse 56, temperature 98.4 F (36.9 C), temperature source Oral, resp. rate 18, height 6' (1.829 m), weight 72.576 kg (160 lb), SpO2 97.00%.Body mass index is 21.7 kg/(m^2). Results for orders placed during the hospital encounter of 11/21/13 (from the past 72 hour(s))  ACETAMINOPHEN LEVEL     Status: None   Collection Time    11/21/13  4:34 PM      Result Value Ref Range   Acetaminophen (Tylenol), Serum <15.0  10 - 30 ug/mL   Comment:            THERAPEUTIC CONCENTRATIONS VARY     SIGNIFICANTLY. A RANGE OF 10-30     ug/mL MAY BE AN EFFECTIVE     CONCENTRATION FOR MANY PATIENTS.     HOWEVER, SOME ARE BEST TREATED     AT CONCENTRATIONS OUTSIDE THIS     RANGE.     ACETAMINOPHEN  CONCENTRATIONS     >150 ug/mL AT 4 HOURS AFTER     INGESTION AND >50 ug/mL AT 12     HOURS AFTER INGESTION ARE     OFTEN ASSOCIATED WITH TOXIC     REACTIONS.  CBC     Status: Abnormal   Collection Time    11/21/13  4:34 PM      Result Value Ref Range   WBC 10.9 (*) 4.0 - 10.5 K/uL   RBC 4.06 (*) 4.22 - 5.81 MIL/uL   Hemoglobin 13.7  13.0 - 17.0 g/dL   HCT 37.2 (*) 39.0 - 52.0 %   MCV 91.6  78.0 - 100.0 fL   MCH 33.7  26.0 - 34.0 pg   MCHC 36.8 (*) 30.0 - 36.0 g/dL   RDW 12.9  11.5 - 15.5 %   Platelets 266  150 - 400 K/uL  COMPREHENSIVE METABOLIC PANEL     Status: Abnormal   Collection Time    11/21/13  4:34 PM      Result Value Ref Range   Sodium 120 (*) 137 - 147 mEq/L  Comment: CRITICAL RESULT CALLED TO, READ BACK BY AND VERIFIED WITH:     NEASE A RN 11/21/2013 1754 BY BOVELL,T.   Potassium 3.7  3.7 - 5.3 mEq/L   Chloride 83 (*) 96 - 112 mEq/L   CO2 21  19 - 32 mEq/L   Glucose, Bld 66 (*) 70 - 99 mg/dL   BUN 6  6 - 23 mg/dL   Creatinine, Ser 0.66  0.50 - 1.35 mg/dL   Calcium 8.9  8.4 - 10.5 mg/dL   Total Protein 7.2  6.0 - 8.3 g/dL   Albumin 4.4  3.5 - 5.2 g/dL   AST 31  0 - 37 U/L   ALT 20  0 - 53 U/L   Alkaline Phosphatase 72  39 - 117 U/L   Total Bilirubin 0.4  0.3 - 1.2 mg/dL   GFR calc non Af Amer >90  >90 mL/min   GFR calc Af Amer >90  >90 mL/min   Comment: (NOTE)     The eGFR has been calculated using the CKD EPI equation.     This calculation has not been validated in all clinical situations.     eGFR's persistently <90 mL/min signify possible Chronic Kidney     Disease.   Anion gap 16 (*) 5 - 15  ETHANOL     Status: Abnormal   Collection Time    11/21/13  4:34 PM      Result Value Ref Range   Alcohol, Ethyl (B) 127 (*) 0 - 11 mg/dL   Comment:            LOWEST DETECTABLE LIMIT FOR     SERUM ALCOHOL IS 11 mg/dL     FOR MEDICAL PURPOSES ONLY  SALICYLATE LEVEL     Status: Abnormal   Collection Time    11/21/13  4:34 PM      Result Value Ref Range    Salicylate Lvl <0.6 (*) 2.8 - 20.0 mg/dL  URINE RAPID DRUG SCREEN (HOSP PERFORMED)     Status: None   Collection Time    11/21/13  5:34 PM      Result Value Ref Range   Opiates NONE DETECTED  NONE DETECTED   Cocaine NONE DETECTED  NONE DETECTED   Benzodiazepines NONE DETECTED  NONE DETECTED   Amphetamines NONE DETECTED  NONE DETECTED   Tetrahydrocannabinol NONE DETECTED  NONE DETECTED   Barbiturates NONE DETECTED  NONE DETECTED   Comment:            DRUG SCREEN FOR MEDICAL PURPOSES     ONLY.  IF CONFIRMATION IS NEEDED     FOR ANY PURPOSE, NOTIFY LAB     WITHIN 5 DAYS.                LOWEST DETECTABLE LIMITS     FOR URINE DRUG SCREEN     Drug Class       Cutoff (ng/mL)     Amphetamine      1000     Barbiturate      200     Benzodiazepine   004     Tricyclics       599     Opiates          300     Cocaine          300     THC              50  SODIUM  Status: Abnormal   Collection Time    11/22/13  4:10 AM      Result Value Ref Range   Sodium 129 (*) 137 - 147 mEq/L   Comment: DELTA CHECK NOTED   Labs are reviewed and are pertinent for presence of alcohol and low sodium.  Cleared by ER MD for discharge.  Current Facility-Administered Medications  Medication Dose Route Frequency Provider Last Rate Last Dose  . ibuprofen (ADVIL,MOTRIN) tablet 600 mg  600 mg Oral Q8H PRN Nicholas A Forcucci, PA-C      . Lurasidone HCl TABS 60 mg  60 mg Oral Daily Nicholas A Forcucci, PA-C   60 mg at 11/22/13 4142  . nicotine (NICODERM CQ - dosed in mg/24 hours) patch 21 mg  21 mg Transdermal Daily Nicholas A Forcucci, PA-C   21 mg at 11/22/13 0930  . ondansetron (ZOFRAN) tablet 4 mg  4 mg Oral Q8H PRN Nicholas A Forcucci, PA-C      . zolpidem (AMBIEN) tablet 5 mg  5 mg Oral QHS PRN Cherylann Parr, PA-C       Current Outpatient Prescriptions  Medication Sig Dispense Refill  . Lurasidone HCl (LATUDA) 20 MG TABS Take 60 mg by mouth daily.        Psychiatric Specialty Exam:     Blood  pressure 104/64, pulse 56, temperature 98.4 F (36.9 C), temperature source Oral, resp. rate 18, height 6' (1.829 m), weight 72.576 kg (160 lb), SpO2 97.00%.Body mass index is 21.7 kg/(m^2).  General Appearance: Casual  Eye Contact::  Good  Speech:  Clear and Coherent  Volume:  Normal  Mood:  Depressed  Affect:  Congruent  Thought Process:  Coherent and Logical  Orientation:  Full (Time, Place, and Person)  Thought Content:  Negative  Suicidal Thoughts:  No  Homicidal Thoughts:  No  Memory:  Immediate;   Good Recent;   Good Remote;   Good  Judgement:  Fair  Insight:  Fair  Psychomotor Activity:  Normal  Concentration:  Good  Recall:  Good  Fund of Knowledge:Good  Language: Good  Akathisia:  Negative  Handed:  Right  AIMS (if indicated):     Assets:  Communication Skills Desire for Improvement  Sleep:      Musculoskeletal: Strength & Muscle Tone: within normal limits Gait & Station: normal Patient leans: N/A  Treatment Plan Summary: discharge home to be followed outpatient.  Do mnot think he needs detox.  TAYLOR,GERALD D 11/22/2013 11:54 AM

## 2013-11-22 NOTE — Progress Notes (Signed)
[facility] Community Liaison Stacy, ° °Did not get to see patient but will be sending information about [facility] Orange Card program, to help patient establish primary care, using the address provided.  °

## 2013-11-22 NOTE — ED Notes (Signed)
Nicholas Melton into see 

## 2014-04-24 ENCOUNTER — Other Ambulatory Visit (HOSPITAL_COMMUNITY)
Admission: RE | Admit: 2014-04-24 | Discharge: 2014-04-24 | Disposition: A | Discharge status: Home / Self Care | Payer: Self-pay | Source: Ambulatory Visit | Attending: Family Medicine | Admitting: Family Medicine

## 2014-04-24 ENCOUNTER — Emergency Department (INDEPENDENT_AMBULATORY_CARE_PROVIDER_SITE_OTHER)
Admission: EM | Admit: 2014-04-24 | Discharge: 2014-04-24 | Disposition: A | Discharge status: Home / Self Care | Payer: Self-pay | Source: Home / Self Care | Attending: Family Medicine | Admitting: Family Medicine

## 2014-04-24 ENCOUNTER — Encounter (HOSPITAL_COMMUNITY): Payer: Self-pay

## 2014-04-24 DIAGNOSIS — Z113 Encounter for screening for infections with a predominantly sexual mode of transmission: Secondary | ICD-10-CM

## 2014-04-24 NOTE — ED Provider Notes (Signed)
CSN: [id number]     Arrival date & time 04/24/14  1228 History   First MD Initiated Contact with Patient 04/24/14 1346     Chief Complaint  Patient presents with  . Exposure to STD   (Consider location/radiation/quality/duration/timing/severity/associated sxs/prior Treatment) HPI Comments: 44 y/o patient with PMHx of homelessness and alcohol abuse in addition to  . Hypertension  . Bipolar 2 disorder  . OCD (obsessive compulsive disorder)  . Anxiety  . Depression  . Panic attacks  . Social phobia  . Posttraumatic stress disorder  . Night terrors, adult  Presents to the clinic requesting routine STI screening. Sexually active. Heterosexual. Denies any current symptoms or sexual encounters of concern. States he just wishes "to be tested."   PCP: none Mental Health: [employer] in [employer]   Patient is a 44 y.o. male presenting with STD exposure.  Exposure to STD    Past Medical History  Diagnosis Date  . Hypertension   . Bipolar 2 disorder   . OCD (obsessive compulsive disorder)   . Anxiety   . Depression   . Panic attacks   . Social phobia   . Posttraumatic stress disorder   . Night terrors, adult    Past Surgical History  Procedure Laterality Date  . Vasectomy    . Thyroidectomy     History reviewed. No pertinent family history. History  Substance Use Topics  . Smoking status: Current Every Day Smoker  . Smokeless tobacco: Not on file  . Alcohol Use: 60.0 oz/week    100 Cans of beer per week    Review of Systems  All other systems reviewed and are negative.   Allergies  Gabapentin  Home Medications   Prior to Admission medications   Medication Sig Start Date End Date Taking? Authorizing Provider  divalproex (DEPAKOTE) 250 MG DR tablet Take 250 mg by mouth 1 day or 1 dose.   Yes Historical Provider, MD  sertraline (ZOLOFT) 100 MG tablet Take 100 mg by mouth daily.   Yes Historical Provider, MD  Lurasidone HCl (LATUDA) 20 MG TABS Take  60 mg by mouth daily.    Historical Provider, MD   BP 148/89 mmHg  Pulse 62  Temp(Src) 98.6 F (37 C) (Oral)  Resp 12  SpO2 100% Physical Exam  Constitutional: He is oriented to person, place, and time. He appears well-developed and well-nourished. No distress.  HENT:  Head: Normocephalic and atraumatic.  Eyes: Conjunctivae are normal. No scleral icterus.  Cardiovascular: Normal rate, regular rhythm and normal heart sounds.   Pulmonary/Chest: Effort normal and breath sounds normal.  Genitourinary:  Patient declines this portion of exam  Musculoskeletal: Normal range of motion.  Neurological: He is alert and oriented to person, place, and time.  Skin: Skin is warm and dry.  Psychiatric: He has a normal mood and affect. His behavior is normal.  Nursing note and vitals reviewed.   ED Course  Procedures (including critical care time) Labs Review Labs Reviewed  RPR  HIV ANTIBODY (ROUTINE TESTING)  URINE CYTOLOGY ANCILLARY ONLY    Imaging Review No results found.   MDM   1. Routine screening for STI (sexually transmitted infection)   Testing for gonorrhea, chlamydia, trichomonas, syphilis, & HIV.  As we discussed, should you wish to undergo additional testing or have additional health concerns, please follow up with the primary care provider of your choice or [facility]. You may also contact the [facility]  to establish for primary care. I would also recommend follow up HIV testing at HD in 2-3 mos. You will only be contacted with positive results requiring treatment. However, you may call clinic for results at anytime.    Ria Clock, [place] 04/24/14 1435

## 2014-04-24 NOTE — Discharge Instructions (Signed)
As we discussed, should you wish to undergo additional testing or have additional health concerns, please follow up with the primary care provider of your choice or the [facility]. You may also contact the [facility] to establish for primary care. I would also recommend follow up HIV testing at HD in 2-3 mos. You will only be contacted with positive results requiring treatment. However, you may call clinic for results at anytime.   Health Maintenance A healthy lifestyle and preventative care can promote health and wellness.  Maintain regular health, dental, and eye exams.  Eat a healthy diet. Foods like vegetables, fruits, whole grains, low-fat dairy products, and lean protein foods contain the nutrients you need and are low in calories. Decrease your intake of foods high in solid fats, added sugars, and salt. Get information about a proper diet from your health care provider, if necessary.  Regular physical exercise is one of the most important things you can do for your health. Most adults should get at least 150 minutes of moderate-intensity exercise (any activity that increases your heart rate and causes you to sweat) each week. In addition, most adults need muscle-strengthening exercises on 2 or more days a week.   Maintain a healthy weight. The body mass index (BMI) is a screening tool to identify possible weight problems. It provides an estimate of body fat based on height and weight. Your health care provider can find your BMI and can help you achieve or maintain a healthy weight. For males 20 years and older:  A BMI below 18.5 is considered underweight.  A BMI of 18.5 to 24.9 is normal.  A BMI of 25 to 29.9 is considered overweight.  A BMI of 30 and above is considered obese.  Maintain normal blood lipids and cholesterol by exercising and minimizing your intake of saturated fat. Eat a balanced diet with plenty of fruits and vegetables. Blood  tests for lipids and cholesterol should begin at age 32 and be repeated every 5 years. If your lipid or cholesterol levels are high, you are over age 78, or you are at high risk for heart disease, you may need your cholesterol levels checked more frequently.Ongoing high lipid and cholesterol levels should be treated with medicines if diet and exercise are not working.  If you smoke, find out from your health care provider how to quit. If you do not use tobacco, do not start.  Lung cancer screening is recommended for adults aged 55-80 years who are at high risk for developing lung cancer because of a history of smoking. A yearly low-dose CT scan of the lungs is recommended for people who have at least a 30-pack-year history of smoking and are current smokers or have quit within the past 15 years. A pack year of smoking is smoking an average of 1 pack of cigarettes a day for 1 year (for example, a 30-pack-year history of smoking could mean smoking 1 pack a day for 30 years or 2 packs a day for 15 years). Yearly screening should continue until the smoker has stopped smoking for at least 15 years. Yearly screening should be stopped for people who develop a health problem that would prevent them from having lung cancer treatment.  If you choose to drink alcohol, do not have more than 2 drinks per day. One drink is considered to be 12 oz (360 mL) of beer, 5 oz (150 mL) of wine, or 1.5 oz (45 mL) of liquor.  Avoid the use of street drugs. Do not share needles with anyone. Ask for help if you need support or instructions about stopping the use of drugs.  High blood pressure causes heart disease and increases the risk of stroke. Blood pressure should be checked at least every 1-2 years. Ongoing high blood pressure should be treated with medicines if weight loss and exercise are not effective.  If you are 5545-43 years old, ask your health care provider if you should take aspirin to prevent heart  disease.  Diabetes screening involves taking a blood sample to check your fasting blood sugar level. This should be done once every 3 years after age 43 if you are at a normal weight and without risk factors for diabetes. Testing should be considered at a younger age or be carried out more frequently if you are overweight and have at least 1 risk factor for diabetes.  Colorectal cancer can be detected and often prevented. Most routine colorectal cancer screening begins at the age of 43 and continues through age 43. However, your health care provider may recommend screening at an earlier age if you have risk factors for colon cancer. On a yearly basis, your health care provider may provide home test kits to check for hidden blood in the stool. A small camera at the end of a tube may be used to directly examine the colon (sigmoidoscopy or colonoscopy) to detect the earliest forms of colorectal cancer. Talk to your health care provider about this at age 43 when routine screening begins. A direct exam of the colon should be repeated every 5-10 years through age 43, unless early forms of precancerous polyps or small growths are found.  People who are at an increased risk for hepatitis B should be screened for this virus. You are considered at high risk for hepatitis B if:  You were born in a country where hepatitis B occurs often. Talk with your health care provider about which countries are considered high risk.  Your parents were born in a high-risk country and you have not received a shot to protect against hepatitis B (hepatitis B vaccine).  You have HIV or AIDS.  You use needles to inject street drugs.  You live with, or have sex with, someone who has hepatitis B.  You are a man who has sex with other men (MSM).  You get hemodialysis treatment.  You take certain medicines for conditions like cancer, organ transplantation, and autoimmune conditions.  Hepatitis C blood testing is recommended  for all people born from [id number] through 1964 and any individual with known risk factors for hepatitis C.  Healthy men should no longer receive prostate-specific antigen (PSA) blood tests as part of routine cancer screening. Talk to your health care provider about prostate cancer screening.  Testicular cancer screening is not recommended for adolescents or adult males who have no symptoms. Screening includes self-exam, a health care provider exam, and other screening tests. Consult with your health care provider about any symptoms you have or any concerns you have about testicular cancer.  Practice safe sex. Use condoms and avoid high-risk sexual practices to reduce the spread of sexually transmitted infections (STIs).  You should be screened for STIs, including gonorrhea and chlamydia if:  You are sexually active and are younger than 24 years.  You are older than 24 years, and your health care provider tells you that you are at risk for this type of infection.  Your sexual activity has changed since you  were last screened, and you are at an increased risk for chlamydia or gonorrhea. Ask your health care provider if you are at risk.  If you are at risk of being infected with HIV, it is recommended that you take a prescription medicine daily to prevent HIV infection. This is called pre-exposure prophylaxis (PrEP). You are considered at risk if:  You are a man who has sex with other men (MSM).  You are a heterosexual man who is sexually active with multiple partners.  You take drugs by injection.  You are sexually active with a partner who has HIV.  Talk with your health care provider about whether you are at high risk of being infected with HIV. If you choose to begin PrEP, you should first be tested for HIV. You should then be tested every 3 months for as long as you are taking PrEP.  Use sunscreen. Apply sunscreen liberally and repeatedly throughout the day. You should seek shade when your  shadow is shorter than you. Protect yourself by wearing long sleeves, pants, a wide-brimmed hat, and sunglasses year round whenever you are outdoors.  Tell your health care provider of new moles or changes in moles, especially if there is a change in shape or color. Also, tell your health care provider if a mole is larger than the size of a pencil eraser.  A one-time screening for abdominal aortic aneurysm (AAA) and surgical repair of large AAAs by ultrasound is recommended for men aged 65-75 years who are current or former smokers.  Stay current with your vaccines (immunizations). Document Released: 08/28/2007 Document Revised: 03/06/2013 Document Reviewed: 07/27/2010 [facility] Patient Information 2015 [place], [place]. This information is not intended to replace advice given to you by your health care provider. Make sure you discuss any questions you have with your health care provider.

## 2014-04-24 NOTE — ED Notes (Signed)
Concern for poss STD exposure. Denies symptoms, wants to be tested

## 2014-04-25 LAB — HIV ANTIBODY (ROUTINE TESTING W REFLEX): HIV SCREEN 4TH GENERATION: NONREACTIVE

## 2014-04-25 LAB — RPR: RPR Ser Ql: NONREACTIVE

## 2014-04-25 LAB — URINE CYTOLOGY ANCILLARY ONLY
CHLAMYDIA, DNA PROBE: NEGATIVE
NEISSERIA GONORRHEA: NEGATIVE
Trichomonas: NEGATIVE

## 2014-04-27 NOTE — ED Notes (Signed)
Patient called to discuss lab reports. After ID verified, discussed negative reports

## 2014-06-11 ENCOUNTER — Emergency Department (INDEPENDENT_AMBULATORY_CARE_PROVIDER_SITE_OTHER)
Admission: EM | Admit: 2014-06-11 | Discharge: 2014-06-11 | Disposition: A | Discharge status: Home / Self Care | Payer: Self-pay | Source: Home / Self Care | Attending: Family Medicine | Admitting: Family Medicine

## 2014-06-11 ENCOUNTER — Encounter (HOSPITAL_COMMUNITY): Payer: Self-pay | Admitting: *Deleted

## 2014-06-11 DIAGNOSIS — R197 Diarrhea, unspecified: Secondary | ICD-10-CM

## 2014-06-11 LAB — CBC
HEMATOCRIT: 41.4 % (ref 39.0–52.0)
Hemoglobin: 14.4 g/dL (ref 13.0–17.0)
MCH: 33.3 pg (ref 26.0–34.0)
MCHC: 34.8 g/dL (ref 30.0–36.0)
MCV: 95.8 fL (ref 78.0–100.0)
Platelets: 231 10*3/uL (ref 150–400)
RBC: 4.32 MIL/uL (ref 4.22–5.81)
RDW: 13.2 % (ref 11.5–15.5)
WBC: 7.9 10*3/uL (ref 4.0–10.5)

## 2014-06-11 LAB — COMPREHENSIVE METABOLIC PANEL
ALT: 13 U/L (ref 0–53)
AST: 17 U/L (ref 0–37)
Albumin: 4.6 g/dL (ref 3.5–5.2)
Alkaline Phosphatase: 69 U/L (ref 39–117)
Anion gap: 6 (ref 5–15)
BUN: 6 mg/dL (ref 6–23)
CO2: 30 mmol/L (ref 19–32)
Calcium: 9.2 mg/dL (ref 8.4–10.5)
Chloride: 100 mmol/L (ref 96–112)
Creatinine, Ser: 0.78 mg/dL (ref 0.50–1.35)
GFR calc non Af Amer: >90 mL/min (ref >90)
Glucose, Bld: 97 mg/dL (ref 70–99)
Potassium: 4.1 mmol/L (ref 3.5–5.1)
Sodium: 136 mmol/L (ref 135–145)
TOTAL PROTEIN: 7 g/dL (ref 6.0–8.3)
Total Bilirubin: 1 mg/dL (ref 0.3–1.2)

## 2014-06-11 LAB — LIPASE, BLOOD: Lipase: 26 U/L (ref 11–59)

## 2014-06-11 NOTE — ED Provider Notes (Signed)
Nicholas Melton is a 44 y.o. male who presents to Urgent Care today for several months of stomach cramping and diarrhea. No vomiting. No bloody or mucousy diarrhea. No periods of constipation. Symptoms wax and wane. No treatment tried yet. No fevers or chills. No weight loss. No change in diet.  He does not currently drink any alcohol. Past Medical History  Diagnosis Date  . Hypertension   . Bipolar 2 disorder   . OCD (obsessive compulsive disorder)   . Anxiety   . Depression   . Panic attacks   . Social phobia   . Posttraumatic stress disorder   . Night terrors, adult    Past Surgical History  Procedure Laterality Date  . Vasectomy    . Thyroidectomy     History  Substance Use Topics  . Smoking status: Current Every Day Smoker -- 1.00 packs/day    Types: Cigarettes  . Smokeless tobacco: Not on file  . Alcohol Use: No   ROS as above Medications: No current facility-administered medications for this encounter.   Current Outpatient Prescriptions  Medication Sig Dispense Refill  . Lurasidone HCl (LATUDA) 20 MG TABS Take 60 mg by mouth daily.    . sertraline (ZOLOFT) 100 MG tablet Take 100 mg by mouth daily.    . divalproex (DEPAKOTE) 250 MG DR tablet Take 250 mg by mouth 1 day or 1 dose.     Allergies  Allergen Reactions  . Gabapentin Nausea And Vomiting    lethargic     Exam:  BP 124/81 mmHg  Pulse 63  Temp(Src) 98.2 F (36.8 C) (Oral)  Resp 12  SpO2 98% Gen: Well NAD HEENT: EOMI,  MMM Lungs: Normal work of breathing. CTABL Heart: RRR no MRG Abd: NABS, Soft. Nondistended, Nontender Exts: Brisk capillary refill, warm and well perfused.   Results for orders placed or performed during the hospital encounter of 06/11/14 (from the past 24 hour(s))  Comprehensive metabolic panel     Status: None   Collection Time: 06/11/14  6:04 PM  Result Value Ref Range   Sodium 136 135 - 145 mmol/L   Potassium 4.1 3.5 - 5.1 mmol/L   Chloride 100 96 - 112 mmol/L   CO2 30 19 -  32 mmol/L   Glucose, Bld 97 70 - 99 mg/dL   BUN 6 6 - 23 mg/dL   Creatinine, Ser 1.610.78 0.50 - 1.35 mg/dL   Calcium 9.2 8.4 - 09.610.5 mg/dL   Total Protein 7.0 6.0 - 8.3 g/dL   Albumin 4.6 3.5 - 5.2 g/dL   AST 17 0 - 37 U/L   ALT 13 0 - 53 U/L   Alkaline Phosphatase 69 39 - 117 U/L   Total Bilirubin 1.0 0.3 - 1.2 mg/dL   GFR calc non Af Amer >90 >90 mL/min   GFR calc Af Amer >90 >90 mL/min   Anion gap 6 5 - 15  Lipase, blood     Status: None   Collection Time: 06/11/14  6:04 PM  Result Value Ref Range   Lipase 26 11 - 59 U/L  CBC     Status: None   Collection Time: 06/11/14  6:04 PM  Result Value Ref Range   WBC 7.9 4.0 - 10.5 K/uL   RBC 4.32 4.22 - 5.81 MIL/uL   Hemoglobin 14.4 13.0 - 17.0 g/dL   HCT 04.541.4 40.939.0 - 81.152.0 %   MCV 95.8 78.0 - 100.0 fL   MCH 33.3 26.0 - 34.0 pg   MCHC  34.8 30.0 - 36.0 g/dL   RDW 16.1 09.6 - 04.5 %   Platelets 231 150 - 400 K/uL   No results found.  Assessment and Plan: 44 y.o. male with diarrhea. Unclear etiology. Stool culture, C. Difficile, ova and parasites pending. Trial of Imodium. Follow-up with [facility].  Discussed warning signs or symptoms. Please see discharge instructions. Patient expresses understanding.     Rodolph Bong, MD 06/11/14 [phone]

## 2014-06-11 NOTE — Discharge Instructions (Signed)
Thank you for coming in today. Try over-the-counter Imodium Bring the stool sample back tomorrow Please call or see Ms Antionette Char for assistance with your bill.  You may qualify for reduced or free services.  Her phone number is [phone]. Her email is [email or web address]  If your belly pain worsens, or you have high fever, bad vomiting, blood in your stool or black tarry stool go to the Emergency Room.     Chronic Diarrhea Diarrhea is frequent loose and watery bowel movements. It can cause you to feel weak and dehydrated. Dehydration can cause you to become tired and thirsty and to have a dry mouth, decreased urination, and dark yellow urine. Diarrhea is a sign of another problem, most often an infection that will not last long. In most cases, diarrhea lasts 2-3 days. Diarrhea that lasts longer than 4 weeks is called long-lasting (chronic) diarrhea. It is important to treat your diarrhea as directed by your health care provider to lessen or prevent future episodes of diarrhea.  CAUSES  There are many causes of chronic diarrhea. The following are some possible causes:   Gastrointestinal infections caused by viruses, bacteria, or parasites.   Food poisoning or food allergies.   Certain medicines, such as antibiotics, chemotherapy, and laxatives.   Artificial sweeteners and fructose.   Digestive disorders, such as celiac disease and inflammatory bowel diseases.   Irritable bowel syndrome.  Some disorders of the pancreas.  Disorders of the thyroid.  Reduced blood flow to the intestines.  Cancer. Sometimes the cause of chronic diarrhea is unknown. RISK FACTORS  Having a severely weakened immune system, such as from HIV or AIDS.   Taking certain types of cancer-fighting drugs (such as with chemotherapy) or other medicines.   Having had a recent organ transplant.   Having a portion of the stomach or small bowel removed.   Traveling to countries  where food and water supplies are often contaminated.  SYMPTOMS  In addition to frequent, loose stools, diarrhea may cause:   Cramping.   Abdominal pain.   Nausea.   Fever.  Fatigue.  Urgent need to use the bathroom.  Loss of bowel control. DIAGNOSIS  Your health care provider must take a careful history and perform a physical exam. Tests given are based on your symptoms and history. Tests may include:   Blood or stool tests. Three or more stool samples may be examined. Stool cultures may be used to test for bacteria or parasites.   X-rays.   A procedure in which a thin tube is inserted into the mouth or rectum (endoscopy). This allows the health care provider to look inside the intestine.  TREATMENT   Treatment is aimed at correcting the cause of the diarrhea when possible.  Diarrhea caused by an infection can often be treated with antibiotic medicines.  Diarrhea not caused by an infection may require you to take long-term medicine or have surgery. Specific treatment should be discussed with your health care provider.  If the cause cannot be determined, treatment aims to relieve symptoms and prevent dehydration. Serious health problems can occur if you do not maintain proper fluid levels. Treatment may include:  Taking an oral rehydration solution (ORS).  Not drinking beverages that contain caffeine (such as tea, coffee, and soft drinks).  Not drinking alcohol.  Maintaining well-balanced nutrition to help you recover faster. HOME CARE INSTRUCTIONS   Drink enough fluids to keep urine clear or pale yellow. Drink 1 cup (8 oz) of fluid for each diarrhea  episode. Avoid fluids that contain simple sugars, fruit juices, whole milk products, and sodas. Hydrate with an ORS. You may purchase the ORS or prepare it at home by mixing the following ingredients together:   - tsp (1.7-3  mL) table salt.   tsp (3  mL) baking soda.   tsp (1.7 mL) salt substitute containing  potassium chloride.  1 tbsp (20 mL) sugar.  4.2 c (1 L) of water.   Certain foods and beverages may increase the speed at which food moves through the gastrointestinal (GI) tract. These foods and beverages should be avoided. They include:  Caffeinated and alcoholic beverages.  High-fiber foods, such as raw fruits and vegetables, nuts, seeds, and whole grain breads and cereals.  Foods and beverages sweetened with sugar alcohols, such as xylitol, sorbitol, and mannitol.   Some foods may be well tolerated and may help thicken stool. These include:  Starchy foods, such as rice, toast, pasta, low-sugar cereal, oatmeal, grits, baked potatoes, crackers, and bagels.  Bananas.  Applesauce.  Add probiotic-rich foods to help increase healthy bacteria in the GI tract. These include yogurt and fermented milk products.  Wash your hands well after each diarrhea episode.  Only take over-the-counter or prescription medicines as directed by your health care provider.  Take a warm bath to relieve any burning or pain from frequent diarrhea episodes. SEEK MEDICAL CARE IF:   You are not urinating as often.  Your urine is a dark color.  You become very tired or dizzy.  You have severe pain in the abdomen or rectum.  Your have blood or pus in your stools.  Your stools look black and tarry. SEEK IMMEDIATE MEDICAL CARE IF:   You are unable to keep fluids down.  You have persistent vomiting.  You have blood in your stool.  Your stools are black and tarry.  You do not urinate in 6-8 hours, or there is only a small amount of very dark urine.  You have abdominal pain that increases or localizes.  You have weakness, dizziness, confusion, or lightheadedness.  You have a severe headache.  Your diarrhea gets worse or does not get better.  You have a fever or persistent symptoms for more than 2-3 days.  You have a fever and your symptoms suddenly get worse. MAKE SURE YOU:    Understand these instructions.  Will watch your condition.  Will get help right away if you are not doing well or get worse. Document Released: 05/22/2003 Document Revised: 03/06/2013 Document Reviewed: 08/24/2012 [facility] Patient Information 2015 [place]. This information is not intended to replace advice given to you by your health care provider. Make sure you discuss any questions you have with your health care provider.

## 2014-06-11 NOTE — ED Notes (Signed)
See physicians assessment. 

## 2014-06-11 NOTE — ED Notes (Signed)
Pt. Given bedpan, sterile cup labeled, tongue blade, gloves and order sheet filled out by Dr. Denyse Amassorey.

## 2014-06-12 LAB — CLOSTRIDIUM DIFFICILE BY PCR: Toxigenic C. Difficile by PCR: NEGATIVE

## 2014-06-12 NOTE — ED Notes (Signed)
[facility] and had questions about his lab studies

## 2014-06-13 LAB — OVA AND PARASITE EXAMINATION
Ova and parasites: NONE SEEN
SPECIAL REQUESTS: NORMAL

## 2014-06-16 LAB — STOOL CULTURE: SPECIAL REQUESTS: NORMAL

## 2014-07-03 ENCOUNTER — Telehealth (HOSPITAL_COMMUNITY): Payer: Self-pay | Admitting: *Deleted

## 2014-07-03 NOTE — ED Notes (Signed)
Pt. called and said no one called his results. I told him we don't call if they are negative.  Pt. verified x 2 and given neg. C.diff, stool culture and ova and parasite results. Vassie MoselleYork, Latima Hamza M 07/03/2014

## 2014-12-23 ENCOUNTER — Encounter (HOSPITAL_COMMUNITY): Payer: Self-pay | Admitting: Emergency Medicine

## 2014-12-23 ENCOUNTER — Emergency Department (INDEPENDENT_AMBULATORY_CARE_PROVIDER_SITE_OTHER)
Admission: EM | Admit: 2014-12-23 | Discharge: 2014-12-23 | Disposition: A | Discharge status: Home / Self Care | Payer: Self-pay | Source: Home / Self Care

## 2014-12-23 DIAGNOSIS — B349 Viral infection, unspecified: Secondary | ICD-10-CM

## 2014-12-23 DIAGNOSIS — F17210 Nicotine dependence, cigarettes, uncomplicated: Secondary | ICD-10-CM

## 2014-12-23 NOTE — ED Provider Notes (Signed)
CSN: [id number]     Arrival date & time 12/23/14  1959 History   None    Chief Complaint  Patient presents with  . Fatigue   (Consider location/radiation/quality/duration/timing/severity/associated sxs/prior Treatment) HPI Comments: 44 yr old male pt [facility] to  [facility] with CC of URI symptoms x several days, resolving now. Taking OTC meds with relief. States here for work note, had fatigue and it resolved with at home rest. No known illness contacts, however works at [employer] with public. Denies CP,SOB,N,V,D at present. States he has been under a lot of stress at work and just needs "checked out"  The history is provided by the patient. No language interpreter was used.    Past Medical History  Diagnosis Date  . Hypertension   . Bipolar 2 disorder (HCC)   . OCD (obsessive compulsive disorder)   . Anxiety   . Depression   . Panic attacks   . Social phobia   . Posttraumatic stress disorder   . Night terrors, adult    Past Surgical History  Procedure Laterality Date  . Vasectomy    . Thyroidectomy     Family History  Problem Relation Age of Onset  . Cancer Brother    Social History  Substance Use Topics  . Smoking status: Current Every Day Smoker -- 1.00 packs/day    Types: Cigarettes  . Smokeless tobacco: None  . Alcohol Use: No    Review of Systems  Constitutional: Negative for fever and chills.  HENT: Positive for congestion, postnasal drip and sore throat.   Eyes: Negative.   Respiratory: Negative for cough and shortness of breath.   Cardiovascular: Negative.   Gastrointestinal: Negative for nausea, vomiting and abdominal pain.  Endocrine: Negative.   Genitourinary: Negative for dysuria.  Musculoskeletal: Negative for back pain.  Skin: Negative for rash.  Allergic/Immunologic: Negative.   Neurological: Negative for headaches.  Hematological: Negative.   Psychiatric/Behavioral: Negative.   All other systems reviewed and are negative.   Allergies   Gabapentin  Home Medications   Prior to Admission medications   Medication Sig Start Date End Date Taking? Authorizing Provider  divalproex (DEPAKOTE) 250 MG DR tablet Take 250 mg by mouth 1 day or 1 dose.    Historical Provider, MD  Lurasidone HCl (LATUDA) 20 MG TABS Take 60 mg by mouth daily.    Historical Provider, MD  sertraline (ZOLOFT) 100 MG tablet Take 100 mg by mouth daily.    Historical Provider, MD   Meds Ordered and Administered this Visit  Medications - No data to display  BP 135/92 mmHg  Pulse 78  Temp(Src) 98.5 F (36.9 C) (Oral)  Resp 16  SpO2 100% No data found.   Physical Exam  Constitutional: He is oriented to person, place, and time. He appears well-developed and well-nourished. He is active and cooperative. He does not have a sickly appearance. He does not appear ill. No distress.  HENT:  Head: Normocephalic.  Right Ear: Tympanic membrane normal. No hemotympanum.  Left Ear: Tympanic membrane normal. No hemotympanum.  Nose: Mucosal edema present. No nasal deformity, septal deviation or nasal septal hematoma. No epistaxis. Right sinus exhibits no maxillary sinus tenderness. Left sinus exhibits no maxillary sinus tenderness.  Mouth/Throat: Uvula is midline and mucous membranes are normal. No trismus in the jaw. No uvula swelling. No oropharyngeal exudate.  Eyes: Conjunctivae and EOM are normal. Pupils are equal, round, and reactive to light. Right eye exhibits no discharge. Left eye exhibits no discharge. Right conjunctiva  is not injected. Right conjunctiva has no hemorrhage. Left conjunctiva is not injected. Left conjunctiva has no hemorrhage. Right pupil is round and reactive. Left pupil is round and reactive. Pupils are equal.  Left lower lid+edema, +TTP orbital rim, no entrapment, no hyphema. Vision 20/40 bilaterally without glasses  Neck: Trachea normal and normal range of motion. No tracheal deviation present.  Cardiovascular: Normal rate, regular rhythm,  normal heart sounds and normal pulses.   Pulmonary/Chest: Effort normal and breath sounds normal.  Musculoskeletal: Normal range of motion.  Neurological: He is alert and oriented to person, place, and time. He has normal strength. No cranial nerve deficit or sensory deficit. GCS eye subscore is 4. GCS verbal subscore is 5. GCS motor subscore is 6.  Skin: Skin is warm and dry.  Psychiatric: He has a normal mood and affect. His speech is normal.  Nursing note and vitals reviewed.   ED Course  Procedures (including critical care time)  Labs Review Labs Reviewed - No data to display  Imaging Review No results found.     MDM   1. Heavy cigarette smoker   2. Viral illness    Discussed plan of care with pt. Pt referred to PCP for general medical issues. Work note given. Pt verbalized understand to this provider.   Clancy Gourd, NP 12/23/14 2207

## 2015-02-13 ENCOUNTER — Encounter (HOSPITAL_COMMUNITY): Payer: Self-pay | Admitting: Emergency Medicine

## 2015-02-13 ENCOUNTER — Emergency Department (HOSPITAL_COMMUNITY)
Admission: EM | Admit: 2015-02-13 | Discharge: 2015-02-14 | Disposition: A | Discharge status: Other Acute Inpatient Hospital | Payer: Self-pay | Attending: Physician Assistant | Admitting: Physician Assistant

## 2015-02-13 DIAGNOSIS — Z79899 Other long term (current) drug therapy: Secondary | ICD-10-CM | POA: Insufficient documentation

## 2015-02-13 DIAGNOSIS — F410 Panic disorder [episodic paroxysmal anxiety] without agoraphobia: Secondary | ICD-10-CM | POA: Insufficient documentation

## 2015-02-13 DIAGNOSIS — F4310 Post-traumatic stress disorder, unspecified: Secondary | ICD-10-CM | POA: Insufficient documentation

## 2015-02-13 DIAGNOSIS — Y9289 Other specified places as the place of occurrence of the external cause: Secondary | ICD-10-CM | POA: Insufficient documentation

## 2015-02-13 DIAGNOSIS — T4271XA Poisoning by unspecified antiepileptic and sedative-hypnotic drugs, accidental (unintentional), initial encounter: Secondary | ICD-10-CM | POA: Insufficient documentation

## 2015-02-13 DIAGNOSIS — F3181 Bipolar II disorder: Secondary | ICD-10-CM | POA: Insufficient documentation

## 2015-02-13 DIAGNOSIS — T424X1A Poisoning by benzodiazepines, accidental (unintentional), initial encounter: Secondary | ICD-10-CM | POA: Insufficient documentation

## 2015-02-13 DIAGNOSIS — F429 Obsessive-compulsive disorder, unspecified: Secondary | ICD-10-CM | POA: Insufficient documentation

## 2015-02-13 DIAGNOSIS — Y9389 Activity, other specified: Secondary | ICD-10-CM | POA: Insufficient documentation

## 2015-02-13 DIAGNOSIS — F17210 Nicotine dependence, cigarettes, uncomplicated: Secondary | ICD-10-CM | POA: Insufficient documentation

## 2015-02-13 DIAGNOSIS — F10129 Alcohol abuse with intoxication, unspecified: Secondary | ICD-10-CM | POA: Insufficient documentation

## 2015-02-13 DIAGNOSIS — I10 Essential (primary) hypertension: Secondary | ICD-10-CM | POA: Insufficient documentation

## 2015-02-13 DIAGNOSIS — Y998 Other external cause status: Secondary | ICD-10-CM | POA: Insufficient documentation

## 2015-02-13 LAB — CBC WITH DIFFERENTIAL/PLATELET
BASOS ABS: 0 10*3/uL (ref 0.0–0.1)
BASOS PCT: 1 %
Eosinophils Absolute: 0.2 10*3/uL (ref 0.0–0.7)
Eosinophils Relative: 3 %
HCT: 42.2 % (ref 39.0–52.0)
HEMOGLOBIN: 15.5 g/dL (ref 13.0–17.0)
LYMPHS PCT: 39 %
Lymphs Abs: 2.5 10*3/uL (ref 0.7–4.0)
MCH: 36.1 pg — ABNORMAL HIGH (ref 26.0–34.0)
MCHC: 36.7 g/dL — ABNORMAL HIGH (ref 30.0–36.0)
MCV: 98.4 fL (ref 78.0–100.0)
Monocytes Absolute: 0.5 10*3/uL (ref 0.1–1.0)
Monocytes Relative: 8 %
NEUTROS ABS: 3.2 10*3/uL (ref 1.7–7.7)
NEUTROS PCT: 50 %
Platelets: 264 10*3/uL (ref 150–400)
RBC: 4.29 MIL/uL (ref 4.22–5.81)
RDW: 13.3 % (ref 11.5–15.5)
WBC: 6.4 10*3/uL (ref 4.0–10.5)

## 2015-02-13 LAB — ACETAMINOPHEN LEVEL: Acetaminophen (Tylenol), Serum: <10 ug/mL — ABNORMAL LOW (ref 10–30)

## 2015-02-13 LAB — COMPREHENSIVE METABOLIC PANEL
ALBUMIN: 4.6 g/dL (ref 3.5–5.0)
ALT: 21 U/L (ref 17–63)
AST: 25 U/L (ref 15–41)
Alkaline Phosphatase: 79 U/L (ref 38–126)
Anion gap: 7 (ref 5–15)
BUN: 6 mg/dL (ref 6–20)
CO2: 26 mmol/L (ref 22–32)
Calcium: 8.7 mg/dL — ABNORMAL LOW (ref 8.9–10.3)
Chloride: 97 mmol/L — ABNORMAL LOW (ref 101–111)
Creatinine, Ser: 0.58 mg/dL — ABNORMAL LOW (ref 0.61–1.24)
GFR calc Af Amer: >60 mL/min (ref >60)
GFR calc non Af Amer: >60 mL/min (ref >60)
GLUCOSE: 86 mg/dL (ref 65–99)
Potassium: 3.5 mmol/L (ref 3.5–5.1)
Sodium: 130 mmol/L — ABNORMAL LOW (ref 135–145)
TOTAL PROTEIN: 7.6 g/dL (ref 6.5–8.1)
Total Bilirubin: 0.6 mg/dL (ref 0.3–1.2)

## 2015-02-13 LAB — RAPID URINE DRUG SCREEN, HOSP PERFORMED
AMPHETAMINES: NOT DETECTED
BARBITURATES: NOT DETECTED
Benzodiazepines: NOT DETECTED
COCAINE: NOT DETECTED
OPIATES: NOT DETECTED
TETRAHYDROCANNABINOL: NOT DETECTED

## 2015-02-13 LAB — SALICYLATE LEVEL: Salicylate Lvl: <4 mg/dL (ref 2.8–30.0)

## 2015-02-13 LAB — ETHANOL: Alcohol, Ethyl (B): 210 mg/dL — ABNORMAL HIGH (ref <5)

## 2015-02-13 MED ORDER — SODIUM CHLORIDE 0.9 % IV BOLUS (SEPSIS)
1000.0000 mL | Freq: Once | INTRAVENOUS | Status: AC
  Administered 2015-02-13: 1000 mL via INTRAVENOUS

## 2015-02-13 MED ORDER — LORATADINE 10 MG PO TABS: 10.0000 mg | ORAL_TABLET | Freq: Every day | ORAL | Status: DC

## 2015-02-13 MED ORDER — LORAZEPAM 1 MG PO TABS: 1.0000 mg | ORAL_TABLET | Freq: Three times a day (TID) | ORAL | Status: DC | PRN

## 2015-02-13 MED ORDER — ONDANSETRON HCL 4 MG PO TABS: 4.0000 mg | ORAL_TABLET | Freq: Three times a day (TID) | ORAL | Status: DC | PRN

## 2015-02-13 MED ORDER — IBUPROFEN 200 MG PO TABS: 600.0000 mg | ORAL_TABLET | Freq: Three times a day (TID) | ORAL | Status: DC | PRN

## 2015-02-13 NOTE — BHH Counselor (Addendum)
[facility] Julie[facility] CottonJosephine, NP. The attending provider is Dr. Geoffery LyonsIrving Lugo. Rooms assignment is 306-2.  The nursing report # is [phone]. Patient has completed all support paperwork. [facility] via [place].

## 2015-02-13 NOTE — ED Notes (Signed)
Bed: WBH41 Expected date:  Expected time:  Means of arrival:  Comments: Hold for 18 

## 2015-02-13 NOTE — BH Assessment (Addendum)
Tele Assessment Note   Nicholas Melton is an 44 y.o. male. The Pt denies SI/HI. Pt denies AVH. Pt admitted to ED staff that he mixed alcohol and sleeping pills in order to harm himself. Pt drank 2 40s, took  of benadryl, and  Klonopin. Pt attempted to overdose in 2013. Pt was hospitalized in 2013 at [facility]. Pt states he is depressed due to recently losing his job. Pt states he was receiving outpatient treatment at [facility] but he has not returned for services since March 2016. Pt states March 2016 was the last time he took medication for his Bipolar disorder. Pt admits to alcohol abuse but denies drug use. Pt's alcohol level is 210. Pt reports drinking 14-15 beers a day. Pt reports decreased sleep and appetite.   Writer consulted with Julieanne Cotton, NP. Per Julieanne Cotton, NP Pt meets inpatient criteria. 300 hall recommended. TTS to seek placement.   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 Hypertension   . Bipolar 2 disorder (HCC)   . OCD (obsessive compulsive disorder)   . Anxiety   . Depression   . Panic attacks   . Social phobia   . Posttraumatic stress disorder   . Night terrors, adult     Past Surgical History  Procedure Laterality Date  . Vasectomy    . Thyroidectomy      Family History:  Family History  Problem Relation Age of Onset  . Cancer Brother     Social History:  reports that he has been smoking Cigarettes.  He has been smoking about 2.00 packs per day. He does not have any smokeless tobacco history on file. He reports that he drinks about 60.0 oz of alcohol per week. He reports that he does not use illicit drugs.  Additional Social History:  Alcohol / Drug Use Pain Medications: Pt denies Prescriptions: Pt denies Over the Counter: Pt denies History of alcohol / drug use?: Yes Longest period of sobriety (when/how long): Unknown Substance #1 Name of Substance 1: Alcohol 1 - Age of  First Use: 15 1 - Amount (size/oz): 14-15 beers 1 - Frequency: daily 1 - Duration: ongoing 1 - Last Use / Amount: 02/13/15  CIWA: CIWA-Ar BP: 117/80 mmHg Pulse Rate: 84 Nausea and Vomiting: no nausea and no vomiting Tactile Disturbances: none Tremor: no tremor Auditory Disturbances: not present Paroxysmal Sweats: no sweat visible Visual Disturbances: not present Anxiety: no anxiety, at ease Headache, Fullness in Head: none present Agitation: normal activity Orientation and Clouding of Sensorium: oriented and can do serial additions CIWA-Ar Total: 0 COWS:    PATIENT STRENGTHS: (choose at least two) Average or above average intelligence Capable of independent living  Allergies:  Allergies  Allergen Reactions  . Depakote Er [Divalproex Sodium Er] Diarrhea  . Gabapentin Nausea And Vomiting    lethargic  . Risperidone And Related Other (See Comments)    Faint, dizzy, lethargic   . Seroquel [Quetiapine] Other (See Comments)    Faint, dizzy, lethargic     Home Medications:  (Not in a hospital admission)  OB/GYN Status:  No LMP for male patient.  General Assessment Data Location of Assessment: [facility] ED TTS Assessment: In system Is this a Tele or Face-to-Face Assessment?: Tele Assessment Is this an Initial Assessment or a Re-assessment for this encounter?: Initial Assessment Marital status: Single Maiden name: NA Is patient pregnant?: No Pregnancy Status: No Living Arrangements: Spouse/significant other Can pt return to  current living arrangement?: Yes Admission Status: Voluntary Is patient capable of signing voluntary admission?: Yes Referral Source: Self/Family/Friend Insurance type: SP     Crisis Care Plan Living Arrangements: Spouse/significant other [facility] Name of Therapist: NA  Education Status Is patient currently in school?: No Current Grade: NA Highest grade of school patient has completed: college Name of school: NA Contact  person: NA  Risk to self with the past 6 months Suicidal Ideation: No Has patient been a risk to self within the past 6 months prior to admission? : No Suicidal Intent: No Has patient had any suicidal intent within the past 6 months prior to admission? : No Is patient at risk for suicide?: No Suicidal Plan?: No Has patient had any suicidal plan within the past 6 months prior to admission? : No Access to Means: No What has been your use of drugs/alcohol within the last 12 months?: Alcoholism Previous Attempts/Gestures: Yes How many times?: 1 Other Self Harm Risks: alcoholism Triggers for Past Attempts: Unknown Intentional Self Injurious Behavior: None Family Suicide History: No Recent stressful life event(s): Other (Comment) Persecutory voices/beliefs?: No Depression: Yes Depression Symptoms: Loss of interest in usual pleasures, Feeling worthless/self pity, Feeling angry/irritable, Fatigue, Isolating, Tearfulness Substance abuse history and/or treatment for substance abuse?: Yes Suicide prevention information given to non-admitted patients: Not applicable  Risk to Others within the past 6 months Homicidal Ideation: No Does patient have any lifetime risk of violence toward others beyond the six months prior to admission? : No Thoughts of Harm to Others: No Current Homicidal Intent: No Current Homicidal Plan: No Access to Homicidal Means: No Identified Victim: NA History of harm to others?: No Assessment of Violence: None Noted Violent Behavior Description: NA Does patient have access to weapons?: No Criminal Charges Pending?: No Does patient have a court date: No Is patient on probation?: No  Psychosis Hallucinations: None noted Delusions: None noted  Mental Status Report Appearance/Hygiene: In scrubs, Disheveled Eye Contact: Poor Motor Activity: Freedom of movement Speech: Slurred Level of Consciousness: Drowsy Mood: Labile Affect: Blunted Anxiety Level:  None Thought Processes: Coherent, Relevant Judgement: Impaired Orientation: Person, Place, Time, Situation, Appropriate for developmental age Obsessive Compulsive Thoughts/Behaviors: None  Cognitive Functioning Concentration: Normal Memory: Recent Intact, Remote Intact IQ: Average Insight: Fair Impulse Control: Fair Appetite: Fair Weight Loss: 0 Weight Gain: 0 Sleep: Decreased Total Hours of Sleep: 4 Vegetative Symptoms: None  ADLScreening [facility] Assessment Services) Patient's cognitive ability adequate to safely complete daily activities?: Yes Patient able to express need for assistance with ADLs?: Yes Independently performs ADLs?: Yes (appropriate for developmental age)  Prior Inpatient Therapy Prior Inpatient Therapy: Yes Prior Therapy Dates: 2013 [facility] Reason for Treatment: Alcoholism  Prior Outpatient Therapy Prior Outpatient Therapy: Yes Prior Therapy Dates: 2016 Prior Therapy Facilty/Provider(s): [facility] Reason for Treatment: Alcoholism Does patient have an ACCT team?: No Does patient have Intensive In-House Services?  : No Does patient have [facility] services? : No Does patient have P4CC services?: No  ADL Screening (condition at time of admission) Patient's cognitive ability adequate to safely complete daily activities?: Yes Is the patient deaf or have difficulty hearing?: No Does the patient have difficulty seeing, even when wearing glasses/contacts?: No Does the patient have difficulty concentrating, remembering, or making decisions?: No Patient able to express need for assistance with ADLs?: Yes Does the patient have difficulty dressing or bathing?: No Independently performs ADLs?: Yes (appropriate for developmental age) Does the patient have difficulty walking or climbing stairs?:  No Weakness of Legs: None Weakness of Arms/Hands: None       Abuse/Neglect Assessment (Assessment to be complete while patient is  alone) Physical Abuse: Denies Verbal Abuse: Denies Sexual Abuse: Denies Exploitation of patient/patient's resources: Denies Self-Neglect: Denies     Merchant navy officer (For Healthcare) Does patient have an advance directive?: No Would patient like information on creating an advanced directive?: No - patient declined information    Additional Information 1:1 In Past 12 Months?: No CIRT Risk: No Elopement Risk: No Does patient have medical clearance?: Yes     Disposition:  Disposition Initial Assessment Completed for this Encounter: Yes Disposition of Patient: Inpatient treatment program Type of inpatient treatment program: Adult  Trennon Torbeck D 02/13/2015 3:04 PM

## 2015-02-13 NOTE — ED Notes (Addendum)
Per EMS patient started drinking last night and has drank 2 40's and a 20 miller lite beers; took 200 mg of benadryl, 2 mg of klonopin. Patient lost job 3 weeks ago and feels depressed. Patient denies wanting to hurt himself, denies SI.

## 2015-02-13 NOTE — ED Notes (Signed)
Patient quiet and pleasant on approach.  Wife in room with him.  States he lost his job 2 or 3 weeks ago and things spiraled out of control for him.  Says he has been drinking 15-18 regular sized beers per day.  No evidence of withdrawal symptoms at this time.  Educated patient about signs to look for and report to nurse as quickly as possible.  Wife concerned about overnight stay.  Advised her that is was safer here so that we could watch for and treat withdrawal symptoms as they arise.  Both verbalized understanding.  Patient was given a container of Gatorade and some ice water.  He was encouraged to drink as much as possible this evening.

## 2015-02-13 NOTE — ED Provider Notes (Signed)
CSN: [id number]     Arrival date & time 02/13/15  0848 History   First MD Initiated Contact with Patient 02/13/15 [phone]     Chief Complaint  Patient presents with  . Alcohol Intoxication     (Consider location/radiation/quality/duration/timing/severity/associated sxs/prior Treatment) HPI   Patient is a 44 year old male presenting with overdose. Patient has been taking the last day and a half and then took 6 blue generic sleeping pills today as well as 2 Klonopin. Patient denies SI currently but states she did it because he was feeling very depressed. He has been feeling depressed since he lost his job. Patient's accompanied by his fiance. Patient has history of anxiety and bipolar OCD social phobias.   Level V caveat altered mental status.  Past Medical History  Diagnosis Date  . Hypertension   . Bipolar 2 disorder (HCC)   . OCD (obsessive compulsive disorder)   . Anxiety   . Depression   . Panic attacks   . Social phobia   . Posttraumatic stress disorder   . Night terrors, adult    Past Surgical History  Procedure Laterality Date  . Vasectomy    . Thyroidectomy     Family History  Problem Relation Age of Onset  . Cancer Brother    Social History  Substance Use Topics  . Smoking status: Current Every Day Smoker -- 2.00 packs/day    Types: Cigarettes  . Smokeless tobacco: None  . Alcohol Use: 60.0 oz/week    100 Cans of beer per week    Review of Systems  Unable to perform ROS: Mental status change      Allergies  Gabapentin  Home Medications   Prior to Admission medications   Medication Sig Start Date End Date Taking? Authorizing Provider  divalproex (DEPAKOTE) 250 MG DR tablet Take 250 mg by mouth 1 day or 1 dose.    Historical Provider, MD  Lurasidone HCl (LATUDA) 20 MG TABS Take 60 mg by mouth daily.    Historical Provider, MD  sertraline (ZOLOFT) 100 MG tablet Take 100 mg by mouth daily.    Historical Provider, MD   BP 168/108 mmHg  Pulse 81   Temp(Src) 98.1 F (36.7 C) (Oral)  Resp 17  SpO2 100% Physical Exam  Constitutional: He is oriented to person, place, and time. He appears well-nourished.  HENT:  Head: Normocephalic.  Mouth/Throat: Oropharynx is clear and moist.  Dry mucous membranes pupils mid sized.  Eyes: Conjunctivae are normal.  Neck: No tracheal deviation present.  Cardiovascular: Normal rate.   Pulmonary/Chest: Effort normal. No stridor. No respiratory distress.  Abdominal: Soft. There is no tenderness. There is no guarding.  Musculoskeletal: Normal range of motion. He exhibits no edema.  Neurological: He is oriented to person, place, and time. No cranial nerve deficit.  Slurred speech consistent with alcohol intoxication  Skin: Skin is warm and dry. No rash noted. He is not diaphoretic.  Psychiatric:  Denies SI however reports that he did overdose today in order to hurt himself.  Nursing note and vitals reviewed.   ED Course  Procedures (including critical care time) Labs Review Labs Reviewed  CBC WITH DIFFERENTIAL/PLATELET  COMPREHENSIVE METABOLIC PANEL  ACETAMINOPHEN LEVEL  ETHANOL  SALICYLATE LEVEL  URINE RAPID DRUG SCREEN, HOSP PERFORMED    Imaging Review No results found. I have personally reviewed and evaluated these images and lab results as part of my medical decision-making.   EKG Interpretation   Date/Time:  Thursday February 13 2015 08:57:46 EST  Ventricular Rate:  90 PR Interval:  142 QRS Duration: 98 QT Interval:  371 QTC Calculation: 454 R Axis:   85 Text Interpretation:  Sinus rhythm no acute ischemia QTc normal QRS  normal. Confirmed by Corlis Leak, COURTNEY ([id number]) on 02/13/2015 9:26:44 AM      MDM   Final diagnoses:  None    Patient is a 44 year old male presenting with alcohol intoxication as well as sleeping pill overdose. Patient alert and oriented 3 on exam. He does have slurred speech consistent with intoxication. Patient reports taking these 6 blue generic  sleeping pills 2 hours ago. We will discuss with poison control. Anticipate EKG, labs and observation. Patient will require TTS consult when medically cleared.  9:20 AM Discussed with poison control. Watching, 4 hour level of acetaminophen. 6 hour obserrvation or until baseline.    Patient much improved. Normal acetaminophen at 4 hours. Will have TTS see patient.    Avik Leoni Randall An, MD 02/15/15 [phone]

## 2015-02-13 NOTE — ED Notes (Signed)
Patient denies SI, HI and AVH at this time. Patient is calm and cooperative at this time. Plan of care discussed with patient. Patient voices no complaints or concerns at this time. Encouragement and support provided and safety maintain. Q 15 min safety checks remain in place.

## 2015-02-13 NOTE — ED Notes (Signed)
Poison control called to follow up. Closed patient case on their end.

## 2015-02-13 NOTE — Progress Notes (Signed)
telepsych in progress Cm will evaluate later

## 2015-02-13 NOTE — BH Assessment (Signed)
TTS Merry ProudBrandi will assess

## 2015-02-13 NOTE — ED Notes (Signed)
Bed: [id number] Expected date:  Expected time:  Means of arrival:  Comments: ETOH

## 2015-02-13 NOTE — ED Notes (Signed)
MD at bedside. 

## 2015-02-13 NOTE — ED Notes (Addendum)
Corlis LeakMackuen, MD, states she will call Poison Control.

## 2015-02-14 ENCOUNTER — Inpatient Hospital Stay (HOSPITAL_COMMUNITY)
Admission: AD | Admit: 2015-02-14 | Discharge: 2015-02-15 | DRG: 885 | Disposition: A | Discharge status: Home / Self Care | Payer: Federal, State, Local not specified - Other | Source: Intra-hospital | Attending: Psychiatry | Admitting: Psychiatry

## 2015-02-14 ENCOUNTER — Encounter (HOSPITAL_COMMUNITY): Payer: Self-pay

## 2015-02-14 DIAGNOSIS — F329 Major depressive disorder, single episode, unspecified: Secondary | ICD-10-CM | POA: Diagnosis present

## 2015-02-14 DIAGNOSIS — F1020 Alcohol dependence, uncomplicated: Secondary | ICD-10-CM | POA: Diagnosis not present

## 2015-02-14 DIAGNOSIS — F3181 Bipolar II disorder: Principal | ICD-10-CM | POA: Diagnosis present

## 2015-02-14 DIAGNOSIS — F17210 Nicotine dependence, cigarettes, uncomplicated: Secondary | ICD-10-CM | POA: Diagnosis present

## 2015-02-14 MED ORDER — CHLORDIAZEPOXIDE HCL 25 MG PO CAPS: 25.0000 mg | ORAL_CAPSULE | Freq: Four times a day (QID) | ORAL | Status: DC | PRN

## 2015-02-14 MED ORDER — NICOTINE 21 MG/24HR TD PT24
21.0000 mg | MEDICATED_PATCH | Freq: Every day | TRANSDERMAL | Status: DC
  Administered 2015-02-14 – 2015-02-15 (×3): 21 mg via TRANSDERMAL
  Filled 2015-02-14 (×4): qty 1

## 2015-02-14 MED ORDER — ONDANSETRON HCL 4 MG PO TABS: 4.0000 mg | ORAL_TABLET | Freq: Three times a day (TID) | ORAL | Status: DC | PRN

## 2015-02-14 MED ORDER — THIAMINE HCL 100 MG/ML IJ SOLN: 100.0000 mg | Freq: Once | INTRAMUSCULAR | Status: DC

## 2015-02-14 MED ORDER — TRAZODONE HCL 50 MG PO TABS
50.0000 mg | ORAL_TABLET | Freq: Every evening | ORAL | Status: DC | PRN
  Administered 2015-02-14: 50 mg via ORAL
  Filled 2015-02-14: qty 14
  Filled 2015-02-14: qty 1

## 2015-02-14 MED ORDER — BUPROPION HCL ER (XL) 150 MG PO TB24
150.0000 mg | ORAL_TABLET | Freq: Every day | ORAL | Status: DC
  Administered 2015-02-15: 150 mg via ORAL
  Filled 2015-02-14: qty 7
  Filled 2015-02-14: qty 1
  Filled 2015-02-14: qty 7
  Filled 2015-02-14: qty 1

## 2015-02-14 MED ORDER — CHLORDIAZEPOXIDE HCL 25 MG PO CAPS
25.0000 mg | ORAL_CAPSULE | Freq: Three times a day (TID) | ORAL | Status: DC
  Administered 2015-02-15: 25 mg via ORAL
  Filled 2015-02-14: qty 1

## 2015-02-14 MED ORDER — CHLORDIAZEPOXIDE HCL 25 MG PO CAPS: 25.0000 mg | ORAL_CAPSULE | Freq: Every day | ORAL | Status: DC

## 2015-02-14 MED ORDER — VITAMIN B-1 100 MG PO TABS
100.0000 mg | ORAL_TABLET | Freq: Every day | ORAL | Status: DC
  Administered 2015-02-15: 100 mg via ORAL
  Filled 2015-02-14 (×3): qty 1

## 2015-02-14 MED ORDER — IBUPROFEN 600 MG PO TABS: 600.0000 mg | ORAL_TABLET | Freq: Three times a day (TID) | ORAL | Status: DC | PRN

## 2015-02-14 MED ORDER — HYDROXYZINE HCL 25 MG PO TABS
25.0000 mg | ORAL_TABLET | Freq: Four times a day (QID) | ORAL | Status: DC | PRN
  Administered 2015-02-14 (×2): 25 mg via ORAL
  Filled 2015-02-14 (×2): qty 1

## 2015-02-14 MED ORDER — CHLORDIAZEPOXIDE HCL 25 MG PO CAPS
25.0000 mg | ORAL_CAPSULE | Freq: Four times a day (QID) | ORAL | Status: AC
  Administered 2015-02-14 (×4): 25 mg via ORAL
  Filled 2015-02-14 (×4): qty 1

## 2015-02-14 MED ORDER — LORATADINE 10 MG PO TABS
10.0000 mg | ORAL_TABLET | Freq: Every day | ORAL | Status: DC
  Filled 2015-02-14: qty 1

## 2015-02-14 MED ORDER — LORAZEPAM 1 MG PO TABS
1.0000 mg | ORAL_TABLET | Freq: Three times a day (TID) | ORAL | Status: DC | PRN
  Administered 2015-02-14: 1 mg via ORAL
  Filled 2015-02-14: qty 1

## 2015-02-14 MED ORDER — LOPERAMIDE HCL 2 MG PO CAPS: 2.0000 mg | ORAL_CAPSULE | ORAL | Status: DC | PRN

## 2015-02-14 MED ORDER — ADULT MULTIVITAMIN W/MINERALS CH
1.0000 | ORAL_TABLET | Freq: Every day | ORAL | Status: DC
  Administered 2015-02-14 – 2015-02-15 (×2): 1 via ORAL
  Filled 2015-02-14 (×4): qty 1

## 2015-02-14 MED ORDER — ONDANSETRON 4 MG PO TBDP: 4.0000 mg | ORAL_TABLET | Freq: Four times a day (QID) | ORAL | Status: DC | PRN

## 2015-02-14 MED ORDER — LURASIDONE HCL 40 MG PO TABS
60.0000 mg | ORAL_TABLET | Freq: Every day | ORAL | Status: DC
  Administered 2015-02-15: 60 mg via ORAL
  Filled 2015-02-14 (×3): qty 1

## 2015-02-14 MED ORDER — LURASIDONE HCL 40 MG PO TABS
40.0000 mg | ORAL_TABLET | Freq: Once | ORAL | Status: AC
  Administered 2015-02-14: 40 mg via ORAL
  Filled 2015-02-14 (×2): qty 1

## 2015-02-14 MED ORDER — CHLORDIAZEPOXIDE HCL 25 MG PO CAPS: 25.0000 mg | ORAL_CAPSULE | ORAL | Status: DC
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
## 2015-02-14 NOTE — Progress Notes (Signed)
Pt did not attend AA group this evening.  

## 2015-02-14 NOTE — [facility] Group Notes (Signed)
[facility] LCSW Group Therapy 02/14/2015  1:15 PM   Type of Therapy: Group Therapy  Participation Level: Did Not Attend. Patient invited to participate but declined.   Rodrecus Belsky, MSW, LCSWA Clinical Social Worker [facility] [phone]   
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
## 2015-02-14 NOTE — Tx Team (Signed)
Initial Interdisciplinary Treatment Plan   PATIENT STRESSORS: Financial difficulties Medication change or noncompliance Occupational concerns Substance abuse   PATIENT STRENGTHS: Average or above average intelligence Capable of independent living Communication skills Supportive family/friends Work skills   PROBLEM LIST: Problem List/Patient Goals Date to be addressed Date deferred Reason deferred Estimated date of resolution  "I need help with alcohol" "I would like to cut back on my smoking from 2 pack to 1/2 pack daily" "I would like to get back on some medication"       Increased depression and anxiety with SI attempt now denied by pt.      Tearful and sad during admission                                           DISCHARGE CRITERIA:  Improved stabilization in mood, thinking, and/or behavior Motivation to continue treatment in a less acute level of care Need for constant or close observation no longer present Verbal commitment to aftercare and medication compliance Withdrawal symptoms are absent or subacute and managed without 24-hour nursing intervention  PRELIMINARY DISCHARGE PLAN: Attend 12-step recovery group Outpatient therapy Participate in family therapy Return to previous living arrangement  PATIENT/FAMIILY INVOLVEMENT: This treatment plan has been presented to and reviewed with the patient, Gloriann LoanMarcus Deltoro, and/or family member, .  The patient and family have been given the opportunity to ask questions and make suggestions.  Cooper RenderSadler, Pearse Shiffler Jean Horne 02/14/2015, 4:03 AM

## 2015-02-14 NOTE — ED Notes (Signed)
Pt transported to [facility] by [facility] transportation service for continuation of specialized care. Belongings given to driver after patient signed for them. Pt left in no acute distress. 

## 2015-02-14 NOTE — Tx Team (Addendum)
Interdisciplinary Treatment Plan Update (Adult)        Date: 02/14/2015   Time Reviewed: 9:30 AM   Progress in Treatment: Improving  Attending groups: Continuing to assess, patient new to milieu  Participating in groups: Continuing to assess, patient new to milieu  Taking medication as prescribed: Yes  Tolerating medication: Yes  Family/Significant other contact made: No, pt refused Patient understands diagnosis: Yes  Discussing patient identified problems/goals with staff: Yes  Medical problems stabilized or resolved: Yes  Denies suicidal/homicidal ideation: Yes  Issues/concerns per patient self-inventory: Yes  Other:   New problem(s) identified: N/A   Discharge Plan: Pt will return home to live with his fiance in [place] and will be given a referral for outpatient services at the [facility]  Reason for Continuation of Hospitalization:   Depression   Anxiety   Medication Stabilization   Comments: N/A   Estimated length of stay: Estimated date of discharge is 02/15/15    Patient is a 44 year old male admitted for takingalcohol and sleeping pills in order to harm himself. Patient lives in [place], [place]. Patient will benefit from crisis stabilization, medication evaluation, group therapy, and psycho education in addition to case management for discharge planning. Patient and CSW reviewed pt's identified goals and treatment plan. Pt verbalized understanding and agreed to treatment plan. Pt will be return to his home in [place], [place] to live with his fiance and will be given a referral for outpatient services at the [facility] for therapy and a screening for medication management.   Review of initial/current patient goals per problem list:  1. Goal(s): Patient will participate in aftercare plan   Met: Yes   Target date: 3-5 days post admission date   As evidenced by: Patient will participate within aftercare plan AEB aftercare provider and  housing plan at discharge being identified.   02/14/15: Goal met. Pt plans to return home to live with his fiance in [place], [place] and has an appointment at the [facility] on 02[facility]20/[facility] for therapy and for a screening for medication management.   2. Goal (s): Patient will exhibit decreased depressive symptoms and suicidal ideations.   Met: Adequate for discharge per MD  Target date: 3-5 days post admission date   As evidenced by: Patient will utilize self-rating of depression at 3 or below and demonstrate decreased signs of depression or be deemed stable for discharge by MD.   02/14/15: Adequate for discharge. Patient denies SI, reports feeling safe for discharge today.     3. Goal(s): Patient will demonstrate decreased signs and symptoms of anxiety.   Met: Adequate for discharge per MD  Target date: 3-5 days post admission date   As evidenced by: Patient will utilize self-rating of anxiety at 3 or below and demonstrated decreased signs of anxiety, or be deemed stable for discharge by MD  `02/14/15: Adequate for discharge.  Pt has low anxiety levels and reports feeling safe for discharge.    4. Goal(s): Patient will demonstrate decreased signs of withdrawal due to substance abuse   Met: Adequate for discharge  Target date: 3-5 days post admission date   As evidenced by: Patient will produce a CIWA/COWS score of 0, have stable vitals signs, and no symptoms of withdrawal   02/14/15: adequate for discharge per MD.  Pt with CIWA score of 10 and COWS score of 7 today, but reports feeling safe for discharge anticipated for tomorrow 02/15/15   Attendees:  Patient:  Family:  Physician: Dr. Sabra Heck, Dr. Parke Poisson, MD      02/14/2015 9:30 AM  Nursing: Vira Browns, Karie Georges, Sandi Carne  , RN    02/14/2015 9:30 AM  Clinical Social Worker: Marylou Flesher, Barrie Lyme Drinkard, [place]  02/14/2015 9:30 AM  Other:       02/14/2015 9:30 AM  Other: Gillie Manners: , NP   02/14/2015 9:30 AM  Other:        02/14/2015 9:30 AM  Other:        02/14/2015 9:30 AM

## 2015-02-14 NOTE — [facility] Suicide Risk Assessment (Signed)
[facility] INPATIENT:  Family/Significant Other Suicide Prevention Education  Suicide Prevention Education:  Patient Refusal for Family/Significant Other Suicide Prevention Education: The patient Nicholas Melton has refused to provide written consent for family/significant other to be provided Family/Significant Other Suicide Prevention Education during admission and/or prior to discharge.  Physician notified.    Dorothe PeaJonathan F Ernestina Joe LCSWA 02/14/2015, 4:09 PM

## 2015-02-14 NOTE — Progress Notes (Signed)
  [facility] Adult Case Management Discharge Plan :  Will you be returning to the same living situation after discharge:  Yes,  Nicholas Melton will return to his home in [place], [place] At discharge, do you have transportation home?: Yes,  Nicholas Melton reports to transportation by friends/family or access to his own vehicle Do you have the ability to pay for your medications: Yes,  Nicholas Melton will be provided with prescriptions at discharge  Release of information consent forms completed and in the chart;  Patient's signature needed at discharge.  Patient to Follow up at: Follow-up Information    [facility] with [facility].   Why:  The[facility] and a screening for medication management.  Please bring proof of income  and call office if you need to reschedule.   Contact information:   [address], [place], [place] [address] Ph: [phone]      Next level of care provider has access to [facility] Link:no  Patient denies SI/HI: Yes,  denies    Safety Planning and Suicide Prevention discussed: Yes,  with Nicholas Melton  Have you used any form of tobacco in the last 30 days? (Cigarettes, Smokeless Tobacco, Cigars, and/or Pipes): Yes  Has patient been referred to the Quitline?: Patient refused referral  Dorothe PeaJonathan F Moreen Piggott LCSWA 02/14/2015, 4:16 PM

## 2015-02-14 NOTE — Plan of Care (Signed)
Problem: Diagnosis: Increased Risk For Suicide Attempt Goal: STG-Patient Will Report Suicidal Feelings to Staff Outcome: Progressing Denies SI, thoughts to self harm. Goal: STG-Patient Will Comply With Medication Regime Outcome: Progressing Patient has been med compliant and verbalizes realization he must stay compliant post d/c

## 2015-02-14 NOTE — Progress Notes (Addendum)
Patient has been in bed much of the day. Did not attend groups. Rates his depression at a 2/10, hopelessness at a 1/10 and anxiety at a 5/10. Later in day up and dressed, visible in milieu. Gait steady.  Complaining of withdrawal midday with a CIWA of "10", VSS. Denies pain. Medicated per librium protocol, fluids encouraged. Fall precautions reviewed and in place. Offered support and encouragement. No prn's requested or given. Patient verbalizes understanding of treatment plan. Denies SI/HI. States Dr. Dub MikesLugo may discharge him tomorrow which he feels good with. Patient remains safe. Nicholas MarseillesFriedman, Nicholas Melton

## 2015-02-14 NOTE — [facility] Suicide Risk Assessment (Signed)
[facility]   Nursing information obtained from:  Patient Demographic factors:  Male, Caucasian, Low socioeconomic status, Unemployed Current Mental Status:  Self-harm behaviors Loss Factors:  Financial problems / change in socioeconomic status Historical Factors:  Family history of mental illness or substance abuse Risk Reduction Factors:  Sense of responsibility to family, Living with another person, especially a relative, Positive social support, Positive therapeutic relationship Total Time spent with patient: 45 minutes Principal Problem: Bipolar 2 disorder, major depressive episode (HCC) Diagnosis:   Patient Active Problem List   Diagnosis Date Noted  . Alcohol use disorder, severe, dependence (HCC) [F10.20] 02/14/2015  . Bipolar 2 disorder, major depressive episode (HCC) [F31.81] 02/14/2015  . Alcohol dependence (HCC) [F10.20] 06/17/2011  . MDD (major depressive disorder) (HCC) [F32.9] 06/15/2011  . Alcohol consumption of more than four drinks per day [Z72.89] 06/15/2011  . PTSD (post-traumatic stress disorder) [F43.10] 06/15/2011    Class: Acute  . Homeless [Z59.0] 06/15/2011     Continued Clinical Symptoms:  Alcohol Use Disorder Identification Test Final Score (AUDIT): 31 The "Alcohol Use Disorders Identification Test", Guidelines for Use in Primary Care, Second Edition.  World Science writerHealth Organization [facility](WHO). Score between 0-7:  no or low risk or alcohol related problems. Score between 8-15:  moderate risk of alcohol related problems. Score between 16-19:  high risk of alcohol related problems. Score 20 or above:  warrants further diagnostic evaluation for alcohol dependence and treatment.   CLINICAL FACTORS:   Bipolar Disorder:   Bipolar II Alcohol/Substance Abuse/Dependencies   Psychiatric Specialty Exam: Physical Exam  ROS  Blood pressure 118/86, pulse 69, temperature 98 F (36.7 C), temperature source Oral, resp. rate 16, height 6\' 1"  (1.854  m), weight 65.318 kg (144 lb), SpO2 100 %.Body mass index is 19 kg/(m^2).   COGNITIVE FEATURES THAT CONTRIBUTE TO RISK:  Closed-mindedness, Polarized thinking and Thought constriction (tunnel vision)    SUICIDE RISK:   Mild:  Suicidal ideation of limited frequency, intensity, duration, and specificity.  There are no identifiable plans, no associated intent, mild dysphoria and related symptoms, good self-control (both objective and subjective assessment), few other risk factors, and identifiable protective factors, including available and accessible social support.  PLAN OF CARE: see admission H and P  Medical Decision Making:  Review of Psycho-Social Stressors (1), Review or order clinical lab tests (1), Review of Medication Regimen & Side Effects (2) and Review of New Medication or Change in Dosage (2)  I certify that inpatient services furnished can reasonably be expected to improve the patient's condition.   Paloma Grange A 02/14/2015, 5:28 PM

## 2015-02-14 NOTE — [facility] Group Notes (Signed)
[facility] LCSW Group Therapy 02/14/2015  1:15 PM   Type of Therapy: Group Therapy  Participation Level: Did Not Attend. Patient invited to participate but declined.   Azka Steger, MSW, LCSWA Clinical Social Worker [facility] [phone]   

## 2015-02-15 DIAGNOSIS — F3181 Bipolar II disorder: Principal | ICD-10-CM

## 2015-02-15 MED ORDER — TRAZODONE HCL 50 MG PO TABS: 50.0000 mg | ORAL_TABLET | Freq: Every evening | ORAL | Status: DC | PRN

## 2015-02-15 MED ORDER — LURASIDONE HCL 60 MG PO TABS: 60.0000 mg | ORAL_TABLET | Freq: Every day | ORAL | Status: DC

## 2015-02-15 MED ORDER — BUPROPION HCL ER (XL) 150 MG PO TB24: 150.0000 mg | ORAL_TABLET | Freq: Every day | ORAL | Status: DC

## 2015-02-15 NOTE — Discharge Summary (Signed)
Physician Discharge Summary Note  Patient:  Nicholas Melton is an 44 y.o., male MRN:  [id number] DOB:  Oct 12, 1970 Patient phone:  [phone] (home)  Patient address:   [address] [place] [address],  Total Time spent with patient: 45 minutes  Date of Admission:  02/14/2015 Date of Discharge: 02/15/2015  Reason for Admission:   History of Present Illness:: 44 Y/O male who states he was doing OK until the last 7 months. Was working a lot and was drinking a lot. Has been with his fiancee for a year planning to get married. He lost her job, his fiancee lost her job. Drinking out of control got upset took an OD of OTC sleeping aids. States told fiancee, they called 911. States he might have said he was wanting to hurt himself. Has been drinking 16 beers a day. Lost his job 3 weeks ago and increased his alcohol intake. Month and a half ago used cocaine. States he is worried about his fiancee as she is by herself and she has bad migraines. Will like to be D/C as soon as possible. He denies SI and would hope to be placed back on his psychotropic agents  The initial assessment is as follows: Nicholas Melton is an 44 y.o. male. The Pt denies SI/HI. Pt denies AVH. Pt admitted to ED staff that he mixed alcohol and sleeping pills in order to harm himself. Pt drank 2 40s, took  of benadryl, and  Klonopin. Pt attempted to overdose in 2013. Pt was hospitalized in 2013 at [facility]. Pt states he is depressed due to recently losing his job. Pt states he was receiving outpatient treatment at [facility] but he has not returned for services since March 2016. Pt states March 2016 was the last time he took medication for his Bipolar disorder. Pt admits to alcohol abuse but denies drug use. Pt's alcohol level is 210. Pt reports drinking 14-15 beers a day. Pt reports decreased sleep and appetite.   Principal Problem: Bipolar 2 disorder, major depressive episode [facility]) Discharge Diagnoses: Patient Active Problem  List   Diagnosis Date Noted  . Alcohol use disorder, severe, dependence (HCC) [F10.20] 02/14/2015  . Bipolar 2 disorder, major depressive episode (HCC) [F31.81] 02/14/2015  . Alcohol dependence (HCC) [F10.20] 06/17/2011  . MDD (major depressive disorder) (HCC) [F32.9] 06/15/2011  . Alcohol consumption of more than four drinks per day [Z72.89] 06/15/2011  . PTSD (post-traumatic stress disorder) [F43.10] 06/15/2011    Class: Acute  . Homeless [Z59.0] 06/15/2011    Past Psychiatric History: See H&P  Past Medical History:  Past Medical History  Diagnosis Date  . Hypertension   . Bipolar 2 disorder (HCC)   . OCD (obsessive compulsive disorder)   . Anxiety   . Depression   . Panic attacks   . Social phobia   . Posttraumatic stress disorder   . Night terrors, adult     Past Surgical History  Procedure Laterality Date  . Vasectomy    . Thyroidectomy     Family History:  Family History  Problem Relation Age of Onset  . Cancer Brother    Family Psychiatric  History: See H&P Social History:  History  Alcohol Use  . 67.2 oz/week  . 112 Cans of beer per week     History  Drug Use  . Yes  . Special: Cocaine    Comment: reports uses appr. once a year    Social History   Social History  . Marital Status:  Single    Spouse Name: N/A  . Number of Children: N/A  . Years of Education: N/A   Social History Main Topics  . Smoking status: Current Every Day Smoker -- 2.00 packs/day for 15 years    Types: Cigarettes  . Smokeless tobacco: Former Neurosurgeon  . Alcohol Use: 67.2 oz/week    112 Cans of beer per week  . Drug Use: Yes    Special: Cocaine     Comment: reports uses appr. once a year  . Sexual Activity: Yes   Other Topics Concern  . None   Social History Narrative    Hospital Course:   Quantez Schnyder was admitted for Bipolar 2 disorder, major depressive episode (HCC), and crisis management.  Pt was treated discharged with the medications listed below under Medication  List.  Medical problems were identified and treated as needed.  Home medications were restarted as appropriate.  Improvement was monitored by observation and Nicholas Melton 's daily report of symptom reduction.  Emotional and mental status was monitored by daily self-inventory reports completed by Nicholas Melton and clinical staff.         Nicholas Melton was evaluated by the treatment team for stability and plans for continued recovery upon discharge. Nicholas Melton 's motivation was an integral factor for scheduling further treatment. Employment, transportation, bed availability, health status, family support, and any pending legal issues were also considered during hospital stay. Pt was offered further treatment options upon discharge including but not limited to Residential, Intensive Outpatient, and Outpatient treatment.  Nicholas Melton will follow up with the services as listed below under Follow Up Information.     Upon completion of this admission the patient was both mentally and medically stable for discharge denying suicidal/homicidal ideation, auditory/visual/tactile hallucinations, delusional thoughts and paranoia.    Nicholas Melton responded well to treatment with Wellbutrin, Librium, Vistaril, Latuda, Ativan, and Trazodone without adverse effects. Pt demonstrated improvement without reported or observed adverse effects to the point of stability appropriate for outpatient management. Pertinent labs include: UDS negative, BAL 210. Reviewed CBC, CMP, BAL, and UDS; all unremarkable aside from noted exceptions.   Physical Findings: AIMS: Facial and Oral Movements Muscles of Facial Expression: None, normal Lips and Perioral Area: None, normal Jaw: None, normal Tongue: None, normal,Extremity Movements Upper (arms, wrists, hands, fingers): None, normal Lower (legs, knees, ankles, toes): None, normal, Trunk Movements Neck, shoulders, hips: None, normal, Overall Severity Severity of abnormal movements  (highest score from questions above): None, normal Incapacitation due to abnormal movements: None, normal Patient's awareness of abnormal movements (rate only patient's report): No Awareness, Dental Status Current problems with teeth and/or dentures?: No Does patient usually wear dentures?: No  CIWA:  CIWA-Ar Total: 5 COWS:  COWS Total Score: 7  Musculoskeletal: Strength & Muscle Tone: within normal limits Gait & Station: normal Patient leans: N/A  Psychiatric Specialty Exam: Review of Systems  Psychiatric/Behavioral: Positive for depression. Negative for suicidal ideas. The patient is nervous/anxious and has insomnia.   All other systems reviewed and are negative.   Blood pressure 119/89, pulse 79, temperature 97.3 F (36.3 C), temperature source Oral, resp. rate 16, height  (1.854 m), weight 65.318 kg (144 lb), SpO2 100 %.Body mass index is 19 kg/(m^2).  SEE MD PSE within the SRA   Have you used any form of tobacco in the last 30 days? (Cigarettes, Smokeless Tobacco, Cigars, and/or Pipes): Yes  Has this patient used any form of tobacco in the last 30 days? (Cigarettes,  Smokeless Tobacco, Cigars, and/or Pipes) Yes, No  Metabolic Disorder Labs:  No results found for: HGBA1C, MPG No results found for: PROLACTIN Lab Results  Component Value Date   CHOL 186 06/15/2011   TRIG 119 06/15/2011   HDL 94 06/15/2011   CHOLHDL 2.0 06/15/2011   VLDL 24 06/15/2011   LDLCALC 68 06/15/2011    See Psychiatric Specialty Exam and Suicide Risk Assessment completed by Attending Physician prior to discharge.  Discharge destination:  Home  Is patient on multiple antipsychotic therapies at discharge:  No   Has Patient had three or more failed trials of antipsychotic monotherapy by history:  No  Recommended Plan for Multiple Antipsychotic Therapies: NA  Discharge Instructions    Activity as tolerated - No restrictions    Complete by:  As directed      Diet general    Complete by:  As  directed      Discharge instructions    Complete by:  As directed   Nicholas Melton has been instructed to take medications as prescribed; and report adverse effects to outpatient provider.  Follow up with primary doctor for any medical issues and If symptoms recur report to nearest emergency or crisis hot line.            Medication List    STOP taking these medications        guaiFENesin 600 MG 12 hr tablet  Commonly known as:  MUCINEX     ibuprofen 200 MG tablet  Commonly known as:  ADVIL,MOTRIN     loperamide 2 MG capsule  Commonly known as:  IMODIUM      TAKE these medications      Indication   buPROPion 150 MG 24 hr tablet  Commonly known as:  WELLBUTRIN XL  Take 1 tablet (150 mg total) by mouth daily.   Indication:  mood stabliization     cetirizine 10 MG tablet  Commonly known as:  ZYRTEC  Take 10 mg by mouth daily.      Lurasidone HCl 60 MG Tabs  Take 60 mg by mouth daily with breakfast.   Indication:  mood stabilization     traZODone 50 MG tablet  Commonly known as:  DESYREL  Take 1 tablet (50 mg total) by mouth at bedtime as needed for sleep (may repeat X 1).   Indication:  Trouble Sleeping           Follow-up Information    [facility].   Why:  [facility] and a screening for medication management.  Please bring proof of income  and call office if you need to reschedule.   Contact information:   [address], [place], [place] [address] Ph: [phone]     Follow-up recommendations:  Activity:  As tolerated Diet:  Heart healthy with low sodium.  Comments:   Take all medications as prescribed. Keep all follow-up appointments as scheduled.  Do not consume alcohol or use illegal drugs while on prescription medications. Report any adverse effects from your medications to your primary care provider promptly.  In the event of recurrent symptoms or worsening symptoms, call 911, a crisis  hotline, or go to the nearest emergency department for evaluation.   Signed: Beau FannyWithrow, Jacque Byron C, FNP-BC 02/15/2015, 9:21 AM

## 2015-02-15 NOTE — [facility] Group Notes (Signed)
[facility] Group Notes:  (Clinical Social Work)   01/11/2015     10:00-11:00AM  Summary of Progress/Problems:   In today's process group a decisional balance exercise was used to explore in depth the perceived benefits and costs of alcohol and drugs, as well as the  benefits and costs of replacing these with healthy coping skills.  Patients listed healthy and unhealthy coping techniques, determining with CSW guidance that unhealthy coping techniques work initially, but eventually become harmful.  Motivational Interviewing and the whiteboard were utilized for the exercises.  The patient expressed that the unhealthy coping he often uses is drinking/drugging (did not specify which) and he has had a recent period of sobriety, but relapsed 7 months ago.  He stated he has Bipolar Disorder, is in the Action stage of change after deciding he is going to pursue his sobriety again, with supports in place.  He is discharging today, and feels he has a good plan in place.  Type of Therapy:  Group Therapy - Process   Participation Level:  Active  Participation Quality:  Attentive, Sharing and Supportive  Affect:  Blunted  Cognitive:  Alert, Appropriate and Oriented  Insight:  Engaged  Engagement in Therapy:  Engaged  Modes of Intervention:  Education, Motivational Interviewing  Ambrose MantleMareida Grossman-Orr, LCSW 02/15/2015, 12:00 PM

## 2015-02-15 NOTE — [facility] Suicide Risk Assessment (Signed)
[facility] Discharge Suicide Risk Assessment   Demographic Factors:  Male, Caucasian, Low socioeconomic status and Unemployed  Total Time spent with patient: 30 minutes  Musculoskeletal: Strength & Muscle Tone: within normal limits Gait & Station: normal Patient leans: N/A  Psychiatric Specialty Exam: Physical Exam  Review of Systems  Constitutional: Negative for fever, chills and malaise/fatigue.  HENT: Negative for nosebleeds, sore throat and tinnitus.   Eyes: Negative for blurred vision, double vision and pain.  Respiratory: Negative for cough, shortness of breath and wheezing.   Cardiovascular: Negative for chest pain, palpitations and leg swelling.  Gastrointestinal: Negative for heartburn, nausea, vomiting, abdominal pain, diarrhea and constipation.  Musculoskeletal: Negative for myalgias, back pain, joint pain and neck pain.  Skin: Negative for itching and rash.  Neurological: Negative for dizziness, tremors, seizures, loss of consciousness, weakness and headaches.    Blood pressure 119/89, pulse 79, temperature 97.3 F (36.3 C), temperature source Oral, resp. rate 16, height  (1.854 m), weight 65.318 kg (144 lb), SpO2 100 %.Body mass index is 19 kg/(m^2).  General Appearance: Fairly Groomed  Patent attorney::  Good  Speech:  Clear and Coherent and Normal Rate409  Volume:  Normal  Mood:  Euthymic  Affect:  Full Range  Thought Process:  Goal Directed  Orientation:  Full (Time, Place, and Person)  Thought Content:  Negative  Suicidal Thoughts:  No  Homicidal Thoughts:  No  Memory:  Immediate;   Good Recent;   Good Remote;   Good  Judgement:  Good  Insight:  Good  Psychomotor Activity:  Normal  Concentration:  Good  Recall:  Good  Fund of Knowledge:Good  Language: Good  Akathisia:  No  Handed:  Right  AIMS (if indicated):     Assets:  Communication Skills Desire for Improvement Housing Intimacy Leisure Time Physical Health Resilience Social  Support Talents/Skills Transportation Vocational/Educational  Sleep:  Number of Hours: 5  Cognition: WNL  ADL's:  Intact   Have you used any form of tobacco in the last 30 days? (Cigarettes, Smokeless Tobacco, Cigars, and/or Pipes): Yes  Has this patient used any form of tobacco in the last 30 days? (Cigarettes, Smokeless Tobacco, Cigars, and/or Pipes) Yes, A prescription for an FDA-approved tobacco cessation medication was offered at discharge and the patient refused  Mental Status Per Nursing Assessment::   On Admission:  Self-harm behaviors  Current Mental Status by Physician: negative   Today denies any alcohol withdrawal symptoms. Reports he was drinking alcohol daily for 3 weeks after losing his job. 2 nights ago while drunk he took some Benadryl in an effort to sleep. It didn't work so he took more. Pt states it was not a suicide attempt. Pt denies any hx of suicide attempts. Denies any access to guns. Reports all alcohol has been removed from his home. States if he develops any withdrawal symptoms he will seek help.   Denies depression, hopelessness and anhedonia. He is looking for a new job and is future oriented. Denies SI/HI. Denies AVH. Denies any other drug use.   Taking meds as prescribed and denies SE. He is ready for d/c home today.   Loss Factors: Financial problems/change in socioeconomic status  Historical Factors: Family history of mental illness or substance abuse  Risk Reduction Factors:   Sense of responsibility to family, Living with another person, especially a relative, Positive social support and Positive therapeutic relationship  Continued Clinical Symptoms:  denies  Cognitive Features That Contribute To Risk:  None  Suicide Risk:  Minimal: No identifiable suicidal ideation.  Patients presenting with no risk factors but with morbid ruminations; may be classified as minimal risk based on the severity of the depressive symptoms  Principal Problem:  Bipolar 2 disorder, major depressive episode [facility](HCC) Discharge Diagnoses:  Patient Active Problem List   Diagnosis Date Noted  . Alcohol use disorder, severe, dependence (HCC) [F10.20] 02/14/2015  . Bipolar 2 disorder, major depressive episode (HCC) [F31.81] 02/14/2015  . Alcohol dependence (HCC) [F10.20] 06/17/2011  . MDD (major depressive disorder) (HCC) [F32.9] 06/15/2011  . Alcohol consumption of more than four drinks per day [Z72.89] 06/15/2011  . PTSD (post-traumatic stress disorder) [F43.10] 06/15/2011    Class: Acute  . Homeless [Z59.0] 06/15/2011    Follow-up Information    [facility].   Why:  Therapy app[facility] on Thursday 02/20/15 [facility]pm [facility] and a screening for medication management.  Please bring proof of income  and call office if you need to reschedule.   Contact information:   [address], [place], [place] [address] Ph: [phone]      Plan Of Care/Follow-up recommendations:  Activity:  as tolerated Diet:  resume normal diet Other:  take meds as prescribed. f/up with psychiatrist and PCP. Stop drinking alcohol and stop all illicit drug use  Wellbutrin XL 150 mg in AM for mood Latuda 60 mg qD for mood   Is patient on multiple antipsychotic therapies at discharge:  No   Has Patient had three or more failed trials of antipsychotic monotherapy by history:  No  Recommended Plan for Multiple Antipsychotic Therapies: NA    Cristo Ausburn 02/15/2015, 9:16 AM

## 2015-02-15 NOTE — Progress Notes (Signed)
Patient ID: Nicholas LoanMarcus Diodato, male   DOB: 01/28/1971, 44 y.o.   MRN: [id number] Patient was discharged to home/self care.  Patient acknowledged all understanding of discharge instructions.   Patient's affect was bright and patient stated that he desires to continue his treatment upon discharge.  Patient contracted for safety and denied SI, HI and AVH upon discharge.

## 2015-02-15 NOTE — Progress Notes (Signed)
Patient seen on day room watching TV and interacting with peers. Appropriate and cooperative. Denies pain, SI, AH/VH at this time. No behavioral issue/complaint noted. Due medication given as prescribed. Every 15 minutes check for safety maintained. Will continue to monitor patient for safety and stability.

## 2015-09-17 ENCOUNTER — Emergency Department (HOSPITAL_COMMUNITY)
Admission: EM | Admit: 2015-09-17 | Discharge: 2015-09-17 | Disposition: A | Discharge status: Home / Self Care | Payer: Self-pay | Attending: Emergency Medicine | Admitting: Emergency Medicine

## 2015-09-17 ENCOUNTER — Emergency Department (HOSPITAL_COMMUNITY): Payer: Self-pay

## 2015-09-17 ENCOUNTER — Encounter (HOSPITAL_COMMUNITY): Payer: Self-pay | Admitting: *Deleted

## 2015-09-17 DIAGNOSIS — R7303 Prediabetes: Secondary | ICD-10-CM

## 2015-09-17 DIAGNOSIS — Z7289 Other problems related to lifestyle: Secondary | ICD-10-CM

## 2015-09-17 DIAGNOSIS — R0781 Pleurodynia: Secondary | ICD-10-CM | POA: Insufficient documentation

## 2015-09-17 DIAGNOSIS — Z79899 Other long term (current) drug therapy: Secondary | ICD-10-CM | POA: Insufficient documentation

## 2015-09-17 DIAGNOSIS — F17210 Nicotine dependence, cigarettes, uncomplicated: Secondary | ICD-10-CM | POA: Insufficient documentation

## 2015-09-17 DIAGNOSIS — R0789 Other chest pain: Secondary | ICD-10-CM

## 2015-09-17 DIAGNOSIS — R002 Palpitations: Secondary | ICD-10-CM

## 2015-09-17 DIAGNOSIS — I10 Essential (primary) hypertension: Secondary | ICD-10-CM | POA: Insufficient documentation

## 2015-09-17 DIAGNOSIS — F1090 Alcohol use, unspecified, uncomplicated: Secondary | ICD-10-CM

## 2015-09-17 DIAGNOSIS — E871 Hypo-osmolality and hyponatremia: Secondary | ICD-10-CM | POA: Insufficient documentation

## 2015-09-17 DIAGNOSIS — F1010 Alcohol abuse, uncomplicated: Secondary | ICD-10-CM | POA: Insufficient documentation

## 2015-09-17 DIAGNOSIS — R7309 Other abnormal glucose: Secondary | ICD-10-CM | POA: Insufficient documentation

## 2015-09-17 LAB — CBC
HEMATOCRIT: 42.4 % (ref 39.0–52.0)
HEMOGLOBIN: 14.7 g/dL (ref 13.0–17.0)
MCH: 33.4 pg (ref 26.0–34.0)
MCHC: 34.7 g/dL (ref 30.0–36.0)
MCV: 96.4 fL (ref 78.0–100.0)
Platelets: 237 10*3/uL (ref 150–400)
RBC: 4.4 MIL/uL (ref 4.22–5.81)
RDW: 13 % (ref 11.5–15.5)
WBC: 6.2 10*3/uL (ref 4.0–10.5)

## 2015-09-17 LAB — BASIC METABOLIC PANEL
Anion gap: 10 (ref 5–15)
BUN: 8 mg/dL (ref 6–20)
CO2: 20 mmol/L — ABNORMAL LOW (ref 22–32)
Calcium: 9.2 mg/dL (ref 8.9–10.3)
Chloride: 96 mmol/L — ABNORMAL LOW (ref 101–111)
Creatinine, Ser: 0.69 mg/dL (ref 0.61–1.24)
GFR calc Af Amer: >60 mL/min (ref >60)
Glucose, Bld: 202 mg/dL — ABNORMAL HIGH (ref 65–99)
Potassium: 4.3 mmol/L (ref 3.5–5.1)
Sodium: 126 mmol/L — ABNORMAL LOW (ref 135–145)

## 2015-09-17 LAB — I-STAT TROPONIN, ED: TROPONIN I, POC: 0.01 ng/mL (ref 0.00–0.08)

## 2015-09-17 LAB — CBG MONITORING, ED: Glucose-Capillary: 145 mg/dL — ABNORMAL HIGH (ref 65–99)

## 2015-09-17 MED ORDER — SODIUM CHLORIDE 1 G PO TABS
2.0000 g | ORAL_TABLET | Freq: Once | ORAL | Status: AC
  Administered 2015-09-17: 2 g via ORAL
  Filled 2015-09-17: qty 2

## 2015-09-17 NOTE — ED Notes (Signed)
Pt in c/o chest pain since last night, also shortness of breath and weakness, symptoms worse today, also reports chills

## 2015-09-17 NOTE — ED Provider Notes (Signed)
CSN: [id number]     Arrival date & time 09/17/15  [id number] History   First MD Initiated Contact with Patient 09/17/15 1914     Chief Complaint  Patient presents with  . Chest Pain     (Consider location/radiation/quality/duration/timing/severity/associated sxs/prior Treatment) Patient is a 45 y.o. male presenting with chest pain. The history is provided by the patient.  Chest Pain Pain location:  Substernal area Pain quality: tightness   Pain radiates to:  Does not radiate Pain radiates to the back: no   Pain severity:  Moderate Onset quality:  Gradual Duration:  6 months Timing:  Constant Progression:  Unchanged Chronicity:  New Context comment:  After intercourse Relieved by:  Nothing Worsened by:  Nothing tried Ineffective treatments:  None tried Associated symptoms: anxiety and weakness   Associated symptoms: no fever and not vomiting   Risk factors: male sex and smoking (1 ppd)   Risk factors: no high cholesterol, no hypertension and not obese     Past Medical History  Diagnosis Date  . Hypertension   . Bipolar 2 disorder (HCC)   . OCD (obsessive compulsive disorder)   . Anxiety   . Depression   . Panic attacks   . Social phobia   . Posttraumatic stress disorder   . Night terrors, adult    Past Surgical History  Procedure Laterality Date  . Vasectomy    . Thyroidectomy     Family History  Problem Relation Age of Onset  . Cancer Brother    Social History  Substance Use Topics  . Smoking status: Current Every Day Smoker -- 2.00 packs/day for 15 years    Types: Cigarettes  . Smokeless tobacco: Former NeurosurgeonUser  . Alcohol Use: 67.2 oz/week    112 Cans of beer per week    Review of Systems  Constitutional: Negative for fever.  Cardiovascular: Positive for chest pain.  Gastrointestinal: Negative for vomiting.  Neurological: Positive for weakness.  All other systems reviewed and are negative.     Allergies  Depakote er; Gabapentin; Risperidone and related;  and Seroquel  Home Medications   Prior to Admission medications   Medication Sig Start Date End Date Taking? Authorizing Provider  buPROPion (WELLBUTRIN XL) 150 MG 24 hr tablet Take 1 tablet (150 mg total) by mouth daily. 02/15/15   Oneta Rackanika N Lewis, NP  cetirizine (ZYRTEC) 10 MG tablet Take 10 mg by mouth daily.    Historical Provider, MD  lurasidone 60 MG TABS Take 60 mg by mouth daily with breakfast. 02/15/15   Oneta Rackanika N Lewis, NP  traZODone (DESYREL) 50 MG tablet Take 1 tablet (50 mg total) by mouth at bedtime as needed for sleep (may repeat X 1). 02/15/15   Oneta Rackanika N Lewis, NP   BP 115/89 mmHg  Pulse 83  Temp(Src) 98.4 F (36.9 C) (Oral)  Resp 20  SpO2 98% Physical Exam  Constitutional: He is oriented to person, place, and time. He appears well-developed and well-nourished. No distress.  HENT:  Head: Normocephalic and atraumatic.  Eyes: Conjunctivae are normal.  Neck: Neck supple. No tracheal deviation present.  Cardiovascular: Normal rate, regular rhythm and normal heart sounds.   Pulmonary/Chest: Effort normal and breath sounds normal. No respiratory distress.  Abdominal: Soft. He exhibits no distension. There is no tenderness.  Neurological: He is alert and oriented to person, place, and time. He has normal strength. Coordination and gait normal. GCS eye subscore is 4. GCS verbal subscore is 5. GCS motor subscore is 6.  Skin: Skin is warm and dry.  Psychiatric: He has a normal mood and affect.  Vitals reviewed.   ED Course  Procedures (including critical care time) Labs Review Labs Reviewed  BASIC METABOLIC PANEL - Abnormal; Notable for the following:    Sodium 126 (*)    Chloride 96 (*)    CO2 20 (*)    Glucose, Bld 202 (*)    All other components within normal limits  CBG MONITORING, ED - Abnormal; Notable for the following:    Glucose-Capillary 145 (*)    All other components within normal limits  CBC  I-STAT TROPOININ, ED    Imaging Review Dg Chest 2  View  09/17/2015  CLINICAL DATA:  Chest tightness beginning last night. EXAM: CHEST  2 VIEW COMPARISON:  None. FINDINGS: The lungs are clear. Heart size is normal. No pneumothorax or pleural effusion. No focal bony abnormality. IMPRESSION: No acute disease. Electronically Signed   By: Drusilla Kannerhomas  Dalessio M.D.   On: 09/17/2015 19:48   I have personally reviewed and evaluated these images and lab results as part of my medical decision-making.   EKG Interpretation   Date/Time:  Wednesday September 17 2015 18:44:13 EDT Ventricular Rate:  84 PR Interval:  152 QRS Duration: 88 QT Interval:  350 QTC Calculation: 413 R Axis:   90 Text Interpretation:  Normal sinus rhythm Right atrial enlargement  Rightward axis Pulmonary disease pattern Abnormal ECG No significant  change since last tracing Confirmed by Colin Norment MD, Reuel BoomANIEL [id number]) on  09/17/2015 7:13:48 PM Also confirmed by Woodfin Kiss MD, Sharilyn [facility]isinger [phone]), editor  BREWER, CCT, GLENN ([id number])  on 09/18/2015 7:40:59 AM      MDM   Final diagnoses:  Palpitations  Chest tightness  Alcohol intake above recommended sensible limits (HCC)  Hyponatremia  Prediabetes   45 y.o. male presents with feeling of palpitations and chest tightness after intercourse. This appears to be the main precipitator of symptoms and does not occur during normal activities. Heart score is 2 with negative troponin and ongoing symptoms for 6 months intermittently, doubt ACS. EKG interpreted by me without ST or T wave changes concerning for myocardial ischemia. No delta wave, no prolonged QTc, no brugada to suggest arrhythmogenicity. PERC negative. Some abnormalities notable on lab screening including hyponatremia which Pt has had before likely 2/2 heavy beer consumption and potomania. A random glucose >200 is suggestive of possible diabetes, Pt will need follow up but repeat glucose is downtrending appropriately without intervention. I recommended decreasing his beer intake and focusing on nutrition  and exercise. Patient needs to establish primary care in the area and was provided contact information to do so.      Lyndal Pulleyaniel Jamaree Hosier, MD 09/18/15 [phone]

## 2015-09-17 NOTE — Discharge Instructions (Signed)
Alcohol Abuse and Nutrition Alcohol abuse is any pattern of alcohol consumption that harms your health, relationships, or work. Alcohol abuse can affect how your body breaks down and absorbs nutrients from food by causing your liver to work abnormally. Additionally, many people who abuse alcohol do not eat enough carbohydrates, protein, fat, vitamins, and minerals. This can cause poor nutrition (malnutrition) and a lack of nutrients (nutrient deficiencies), which can lead to further complications. Nutrients that are commonly lacking (deficient) among people who abuse alcohol include:  Vitamins.  Vitamin A. This is stored in your liver. It is important for your vision, metabolism, and ability to fight off infections (immunity).  B vitamins. These include vitamins such as folate, thiamin, and niacin. These are important in new cell growth and maintenance.  Vitamin C. This plays an important role in iron absorption, wound healing, and immunity.  Vitamin D. This is produced by your liver, but you can also get vitamin D from food. Vitamin D is necessary for your body to absorb and use calcium.  Minerals.  Calcium. This is important for your bones and your heart and blood vessel (cardiovascular) function.  Iron. This is important for blood, muscle, and nervous system functioning.  Magnesium. This plays an important role in muscle and nerve function, and it helps to control blood sugar and blood pressure.  Zinc. This is important for the normal function of your nervous system and digestive system (gastrointestinal tract). Nutrition is an essential component of therapy for alcohol abuse. Your health care provider or dietitian will work with you to design a plan that can help restore nutrients to your body and prevent potential complications. WHAT IS MY PLAN? Your dietitian may develop a specific diet plan that is based on your condition and any other complications you may have. A diet plan will  commonly include:  A balanced diet.  Grains: 6-8 oz per day.  Vegetables: 2-3 cups per day.  Fruits: 1-2 cups per day.  Meat and other protein: 5-6 oz per day.  Dairy: 2-3 cups per day.  Vitamin and mineral supplements. WHAT DO I NEED TO KNOW ABOUT ALCOHOL AND NUTRITION?  Consume foods that are high in antioxidants, such as grapes, berries, nuts, green tea, and dark green and orange vegetables. This can help to counteract some of the stress that is placed on your liver by consuming alcohol.  Avoid food and drinks that are high in fat and sugar. Foods such as sugared soft drinks, salty snack foods, and candy contain empty calories. This means that they lack important nutrients such as protein, fiber, and vitamins.  Eat frequent meals and snacks. Try to eat 5-6 small meals each day.  Eat a variety of fresh fruits and vegetables each day. This will help you get plenty of water, fiber, and vitamins in your diet.  Drink plenty of water and other clear fluids. Try to drink at least 48-64 oz (1.5-2 L) of water per day.  If you are a vegetarian, eat a variety of protein-rich foods. Pair whole grains with plant-based proteins at meals and snacks to obtain the greatest nutrient benefit from your food. For example, eat rice with beans, put peanut butter on whole-grain toast, or eat oatmeal with sunflower seeds.  Soak beans and whole grains overnight before cooking. This can help your body to absorb the nutrients more easily.  Include foods fortified with vitamins and minerals in your diet. Commonly fortified foods include milk, orange juice, cereal, and bread.  If you  are malnourished, your dietitian may recommend a high-protein, high-calorie diet. This may include: °¨ 2,000-3,000 calories (kilocalories) per day. °¨ 70-100 grams of protein per day. °· Your health care provider may recommend a complete nutritional supplement beverage. This can help to restore calories, protein, and vitamins to  your body. Depending on your condition, you may be advised to consume this instead of or in addition to meals. °· Limit your intake of caffeine. Replace drinks like coffee and black tea with decaffeinated coffee and herbal tea. °· Eat a variety of foods that are high in omega fatty acids. These include fish, nuts and seeds, and soybeans. These foods may help your liver to recover and may also stabilize your mood. °· Certain medicines may cause changes in your appetite, taste, and weight. Work with your health care provider and dietitian to make any adjustments to your medicines and diet plan. °· Include other healthy lifestyle choices in your daily routine. °¨ Be physically active. °¨ Get enough sleep. °¨ Spend time doing activities that you enjoy. °· If you are unable to take in enough food and calories by mouth, your health care provider may recommend a feeding tube. This is a tube that passes through your nose and throat, directly into your stomach. Nutritional supplement beverages can be given to you through the feeding tube to help you get the nutrients you need. °· Take vitamin or mineral supplements as recommended by your health care provider. °WHAT FOODS CAN I EAT? °Grains °Enriched pasta. Enriched rice. Fortified whole-grain bread. Fortified whole-grain cereal. Barley. Brown rice. Quinoa. Millet. °Vegetables °All fresh, frozen, and canned vegetables. Spinach. Kale. Artichoke. Carrots. Winter squash and pumpkin. Sweet potatoes. Broccoli. Cabbage. Cucumbers. Tomatoes. Sweet peppers. Green beans. Peas. Corn. °Fruits °All fresh and frozen fruits. Berries. Grapes. Mango. Papaya. Guava. Cherries. Apples. Bananas. Peaches. Plums. Pineapple. Watermelon. Cantaloupe. Oranges. Avocado. °Meats and Other Protein Sources °Beef liver. Lean beef. Pork. Fresh and canned chicken. Fresh fish. Oysters. Sardines. Canned tuna. Shrimp. Eggs with yolks. Nuts and seeds. Peanut butter. Beans and lentils. Soybeans.  Tofu. °Dairy °Whole, low-fat, and nonfat milk. Whole, low-fat, and nonfat yogurt. Cottage cheese. Sour cream. Hard and soft cheeses. °Beverages °Water. Herbal tea. Decaffeinated coffee. Decaffeinated green tea. 100% fruit juice. 100% vegetable juice. Instant breakfast shakes. °Condiments °Ketchup. Mayonnaise. Mustard. Salad dressing. Barbecue sauce. °Sweets and Desserts °Sugar-free ice cream. Sugar-free pudding. Sugar-free gelatin. °Fats and Oils °Butter. Vegetable oil, flaxseed oil, olive oil, and walnut oil. °Other °Complete nutrition shakes. Protein bars. Sugar-free gum. °The items listed above may not be a complete list of recommended foods or beverages. Contact your dietitian for more options. °WHAT FOODS ARE NOT RECOMMENDED? °Grains °Sugar-sweetened breakfast cereals. Flavored instant oatmeal. Fried breads. °Vegetables °Breaded or deep-fried vegetables. °Fruits °Dried fruit with added sugar. Candied fruit. Canned fruit in syrup. °Meats and Other Protein Sources °Breaded or deep-fried meats. °Dairy °Flavored milks. Fried cheese curds or fried cheese sticks. °Beverages °Alcohol. Sugar-sweetened soft drinks. Sugar-sweetened tea. Caffeinated coffee and tea. °Condiments °Sugar. Honey. Agave nectar. Molasses. °Sweets and Desserts °Chocolate. Cake. Cookies. Candy. °Other °Potato chips. Pretzels. Salted nuts. Candied nuts. °The items listed above may not be a complete list of foods and beverages to avoid. Contact your dietitian for more information. °  °This information is not intended to replace advice given to you by your health care provider. Make sure you discuss any questions you have with your health care provider. °  °Document Released: 12/24/2004 Document Revised: 03/22/2014 Document Reviewed: 10/02/2013 °Elsevier Interactive Patient   Education 2016 Elsevier Inc. Nonspecific Chest Pain  Chest pain can be caused by many different conditions. There is always a chance that your pain could be related to  something serious, such as a heart attack or a blood clot in your lungs. Chest pain can also be caused by conditions that are not life-threatening. If you have chest pain, it is very important to follow up with your health care provider. CAUSES  Chest pain can be caused by:  Heartburn.  Pneumonia or bronchitis.  Anxiety or stress.  Inflammation around your heart (pericarditis) or lung (pleuritis or pleurisy).  A blood clot in your lung.  A collapsed lung (pneumothorax). It can develop suddenly on its own (spontaneous pneumothorax) or from trauma to the chest.  Shingles infection (varicella-zoster virus).  Heart attack.  Damage to the bones, muscles, and cartilage that make up your chest wall. This can include:  Bruised bones due to injury.  Strained muscles or cartilage due to frequent or repeated coughing or overwork.  Fracture to one or more ribs.  Sore cartilage due to inflammation (costochondritis). RISK FACTORS  Risk factors for chest pain may include:  Activities that increase your risk for trauma or injury to your chest.  Respiratory infections or conditions that cause frequent coughing.  Medical conditions or overeating that can cause heartburn.  Heart disease or family history of heart disease.  Conditions or health behaviors that increase your risk of developing a blood clot.  Having had chicken pox (varicella zoster). SIGNS AND SYMPTOMS Chest pain can feel like:  Burning or tingling on the surface of your chest or deep in your chest.  Crushing, pressure, aching, or squeezing pain.  Dull or sharp pain that is worse when you move, cough, or take a deep breath.  Pain that is also felt in your back, neck, shoulder, or arm, or pain that spreads to any of these areas. Your chest pain may come and go, or it may stay constant. DIAGNOSIS Lab tests or other studies may be needed to find the cause of your pain. Your health care provider may have you take a test  called an ambulatory ECG (electrocardiogram). An ECG records your heartbeat patterns at the time the test is performed. You may also have other tests, such as:  Transthoracic echocardiogram (TTE). During echocardiography, sound waves are used to create a picture of all of the heart structures and to look at how blood flows through your heart.  Transesophageal echocardiogram (TEE).This is a more advanced imaging test that obtains images from inside your body. It allows your health care provider to see your heart in finer detail.  Cardiac monitoring. This allows your health care provider to monitor your heart rate and rhythm in real time.  Holter monitor. This is a portable device that records your heartbeat and can help to diagnose abnormal heartbeats. It allows your health care provider to track your heart activity for several days, if needed.  Stress tests. These can be done through exercise or by taking medicine that makes your heart beat more quickly.  Blood tests.  Imaging tests. TREATMENT  Your treatment depends on what is causing your chest pain. Treatment may include:  Medicines. These may include:  Acid blockers for heartburn.  Anti-inflammatory medicine.  Pain medicine for inflammatory conditions.  Antibiotic medicine, if an infection is present.  Medicines to dissolve blood clots.  Medicines to treat coronary artery disease.  Supportive care for conditions that do not require medicines. This may include:  Resting.  Applying heat or cold packs to injured areas.  Limiting activities until pain decreases. HOME CARE INSTRUCTIONS  If you were prescribed an antibiotic medicine, finish it all even if you start to feel better.  Avoid any activities that bring on chest pain.  Do not use any tobacco products, including cigarettes, chewing tobacco, or electronic cigarettes. If you need help quitting, ask your health care provider.  Do not drink alcohol.  Take  medicines only as directed by your health care provider.  Keep all follow-up visits as directed by your health care provider. This is important. This includes any further testing if your chest pain does not go away.  If heartburn is the cause for your chest pain, you may be told to keep your head raised (elevated) while sleeping. This reduces the chance that acid will go from your stomach into your esophagus.  Make lifestyle changes as directed by your health care provider. These may include:  Getting regular exercise. Ask your health care provider to suggest some activities that are safe for you.  Eating a heart-healthy diet. A registered dietitian can help you to learn healthy eating options.  Maintaining a healthy weight.  Managing diabetes, if necessary.  Reducing stress. SEEK MEDICAL CARE IF:  Your chest pain does not go away after treatment.  You have a rash with blisters on your chest.  You have a fever. SEEK IMMEDIATE MEDICAL CARE IF:   Your chest pain is worse.  You have an increasing cough, or you cough up blood.  You have severe abdominal pain.  You have severe weakness.  You faint.  You have chills.  You have sudden, unexplained chest discomfort.  You have sudden, unexplained discomfort in your arms, back, neck, or jaw.  You have shortness of breath at any time.  You suddenly start to sweat, or your skin gets clammy.  You feel nauseous or you vomit.  You suddenly feel light-headed or dizzy.  Your heart begins to beat quickly, or it feels like it is skipping beats. These symptoms may represent a serious problem that is an emergency. Do not wait to see if the symptoms will go away. Get medical help right away. Call your local emergency services (911 in the [place] Do not drive yourself to the hospital.   This information is not intended to replace advice given to you by your health care provider. Make sure you discuss any questions you have with your  health care provider.   Document Released: 12/09/2004 Document Revised: 03/22/2014 Document Reviewed: 10/05/2013 Elsevier Interactive Patient Education 2016 [employer] Palpitations A palpitation is the feeling that your heartbeat is irregular or is faster than normal. It may feel like your heart is fluttering or skipping a beat. Palpitations are usually not a serious problem. However, in some cases, you may need further medical evaluation. CAUSES  Palpitations can be caused by:  Smoking.  Caffeine or other stimulants, such as diet pills or energy drinks.  Alcohol.  Stress and anxiety.  Strenuous physical activity.  Fatigue.  Certain medicines.  Heart disease, especially if you have a history of irregular heart rhythms (arrhythmias), such as atrial fibrillation, atrial flutter, or supraventricular tachycardia.  An improperly working pacemaker or defibrillator. DIAGNOSIS  To find the cause of your palpitations, your health care provider will take your medical history and perform a physical exam. Your health care provider may also have you take a test called an ambulatory electrocardiogram (ECG). An ECG records your heartbeat patterns  over a 24-hour period. You may also have other tests, such as:  Transthoracic echocardiogram (TTE). During echocardiography, sound waves are used to evaluate how blood flows through your heart.  Transesophageal echocardiogram (TEE).  Cardiac monitoring. This allows your health care provider to monitor your heart rate and rhythm in real time.  Holter monitor. This is a portable device that records your heartbeat and can help diagnose heart arrhythmias. It allows your health care provider to track your heart activity for several days, if needed.  Stress tests by exercise or by giving medicine that makes the heart beat faster. TREATMENT  Treatment of palpitations depends on the cause of your symptoms and can vary greatly. Most cases of palpitations  do not require any treatment other than time, relaxation, and monitoring your symptoms. Other causes, such as atrial fibrillation, atrial flutter, or supraventricular tachycardia, usually require further treatment. HOME CARE INSTRUCTIONS   Avoid:  Caffeinated coffee, tea, soft drinks, diet pills, and energy drinks.  Chocolate.  Alcohol.  Stop smoking if you smoke.  Reduce your stress and anxiety. Things that can help you relax include:  A method of controlling things in your body, such as your heartbeats, with your mind (biofeedback).  Yoga.  Meditation.  Physical activity such as swimming, jogging, or walking.  Get plenty of rest and sleep. SEEK MEDICAL CARE IF:   You continue to have a fast or irregular heartbeat beyond 24 hours.  Your palpitations occur more often. SEEK IMMEDIATE MEDICAL CARE IF:  You have chest pain or shortness of breath.  You have a severe headache.  You feel dizzy or you faint. MAKE SURE YOU:  Understand these instructions.  Will watch your condition.  Will get help right away if you are not doing well or get worse.   This information is not intended to replace advice given to you by your health care provider. Make sure you discuss any questions you have with your health care provider.   Document Released: 02/27/2000 Document Revised: 03/06/2013 Document Reviewed: 04/30/2011 Elsevier Interactive Patient Education [employer]2016 Elsevier Inc.

## 2015-10-07 ENCOUNTER — Ambulatory Visit (HOSPITAL_COMMUNITY)
Admission: EM | Admit: 2015-10-07 | Discharge: 2015-10-07 | Disposition: A | Discharge status: Home / Self Care | Payer: Self-pay | Attending: Family Medicine | Admitting: Family Medicine

## 2015-10-07 ENCOUNTER — Encounter (HOSPITAL_COMMUNITY): Payer: Self-pay | Admitting: Emergency Medicine

## 2015-10-07 DIAGNOSIS — R197 Diarrhea, unspecified: Secondary | ICD-10-CM

## 2015-10-07 NOTE — Discharge Instructions (Signed)
[place] and antifungal cream as directed

## 2015-10-07 NOTE — ED Triage Notes (Signed)
Pt. Stated, I need a work note to say I can go back to work. This UC diagnosed me with IBS.

## 2015-10-07 NOTE — ED Provider Notes (Signed)
CSN: [id number]     Arrival date & time 10/07/15  1937 History   None    Chief Complaint  Patient presents with  . Abdominal Pain   (Consider location/radiation/quality/duration/timing/severity/associated sxs/prior Treatment) Patient presents with flare up of IBS X3 days . Condition is acute on chronic  in nature. Condition is made better by nothing. Condition is made worse by stress. Patient denies any relief from OTC anti-diarrhea medication. Patient denies any fevers, nausea or vomitting. Patient states that I need a work note stating that I am okay to return to work and I am not contagious       Past Medical History:  Diagnosis Date  . Anxiety   . Bipolar 2 disorder (HCC)   . Depression   . Hypertension   . Night terrors, adult   . OCD (obsessive compulsive disorder)   . Panic attacks   . Posttraumatic stress disorder   . Social phobia    Past Surgical History:  Procedure Laterality Date  . THYROIDECTOMY    . VASECTOMY     Family History  Problem Relation Age of Onset  . Cancer Brother    Social History  Substance Use Topics  . Smoking status: Current Every Day Smoker    Packs/day: 2.00    Years: 15.00    Types: Cigarettes  . Smokeless tobacco: Former Neurosurgeon  . Alcohol use 67.2 oz/week    112 Cans of beer per week    Review of Systems  Constitutional: Negative.   Gastrointestinal: Positive for abdominal pain and diarrhea.  Skin:       Buttocks is raw from diarrhea    Allergies  Gabapentin; Risperidone and related; Seroquel [quetiapine]; and Depakote er [divalproex sodium er]  Home Medications   Prior to Admission medications   Medication Sig Start Date End Date Taking? Authorizing Provider  buPROPion (WELLBUTRIN XL) 150 MG 24 hr tablet Take 1 tablet (150 mg total) by mouth daily. 02/15/15   Oneta Rack, NP  cetirizine (ZYRTEC) 10 MG tablet Take 10 mg by mouth daily.    Historical Provider, MD  clonazePAM (KLONOPIN) 1 MG tablet Take 1 mg by mouth 2  (two) times daily.    Historical Provider, MD  gabapentin (NEURONTIN) 400 MG capsule Take 400 mg by mouth 3 (three) times daily.    Historical Provider, MD  lurasidone 60 MG TABS Take 60 mg by mouth daily with breakfast. 02/15/15   Oneta Rack, NP  mirtazapine (REMERON) 30 MG tablet Take 30 mg by mouth at bedtime.    Historical Provider, MD  traZODone (DESYREL) 50 MG tablet Take 1 tablet (50 mg total) by mouth at bedtime as needed for sleep (may repeat X 1). 02/15/15   Oneta Rack, NP   Meds Ordered and Administered this Visit  Medications - No data to display  BP (!) 173/103 (BP Location: Left Arm)   Pulse 73   Temp 98.2 F (36.8 C) (Oral)   Resp 16   Ht  (1.854 m)   Wt 150 lb (68 kg)   SpO2 96%   BMI 19.79 kg/m  No data found.   Physical Exam  Constitutional: He appears well-developed and well-nourished.  Cardiovascular: Normal rate.   Pulmonary/Chest: Effort normal and breath sounds normal.  Skin: Skin is warm and dry.    Urgent Care Course   Clinical Course    Procedures (including critical care time)  Labs Review Labs Reviewed - No data to display  Imaging  Review No results found.   Visual Acuity Review  Right Eye Distance:   Left Eye Distance:   Bilateral Distance:    Right Eye Near:   Left Eye Near:    Bilateral Near:         MDM   1. Diarrhea, unspecified type        Alene Mires, NP 10/07/15 2132

## 2015-10-07 NOTE — ED Triage Notes (Signed)
Pt. Stated, I have IBS and its been going on for 1 year and 7 months.  Im under a lot of stress and Im afraid im going to be dehydrated,

## 2016-01-22 ENCOUNTER — Inpatient Hospital Stay (HOSPITAL_COMMUNITY): Payer: Self-pay

## 2016-01-22 ENCOUNTER — Inpatient Hospital Stay (HOSPITAL_COMMUNITY)
Admission: EM | Admit: 2016-01-22 | Discharge: 2016-01-24 | DRG: 917 | Disposition: A | Discharge status: Home / Self Care | Payer: Self-pay | Attending: Internal Medicine | Admitting: Internal Medicine

## 2016-01-22 ENCOUNTER — Emergency Department (HOSPITAL_COMMUNITY): Payer: Self-pay

## 2016-01-22 ENCOUNTER — Encounter (HOSPITAL_COMMUNITY): Payer: Self-pay | Admitting: *Deleted

## 2016-01-22 DIAGNOSIS — F4310 Post-traumatic stress disorder, unspecified: Secondary | ICD-10-CM | POA: Diagnosis present

## 2016-01-22 DIAGNOSIS — T50902A Poisoning by unspecified drugs, medicaments and biological substances, intentional self-harm, initial encounter: Secondary | ICD-10-CM

## 2016-01-22 DIAGNOSIS — I10 Essential (primary) hypertension: Secondary | ICD-10-CM | POA: Diagnosis present

## 2016-01-22 DIAGNOSIS — G92 Toxic encephalopathy: Secondary | ICD-10-CM | POA: Diagnosis present

## 2016-01-22 DIAGNOSIS — F429 Obsessive-compulsive disorder, unspecified: Secondary | ICD-10-CM | POA: Diagnosis present

## 2016-01-22 DIAGNOSIS — J9690 Respiratory failure, unspecified, unspecified whether with hypoxia or hypercapnia: Secondary | ICD-10-CM

## 2016-01-22 DIAGNOSIS — F10229 Alcohol dependence with intoxication, unspecified: Secondary | ICD-10-CM | POA: Diagnosis present

## 2016-01-22 DIAGNOSIS — E440 Moderate protein-calorie malnutrition: Secondary | ICD-10-CM | POA: Insufficient documentation

## 2016-01-22 DIAGNOSIS — T43592A Poisoning by other antipsychotics and neuroleptics, intentional self-harm, initial encounter: Principal | ICD-10-CM | POA: Diagnosis present

## 2016-01-22 DIAGNOSIS — F3181 Bipolar II disorder: Secondary | ICD-10-CM | POA: Diagnosis present

## 2016-01-22 DIAGNOSIS — F17210 Nicotine dependence, cigarettes, uncomplicated: Secondary | ICD-10-CM | POA: Diagnosis present

## 2016-01-22 DIAGNOSIS — Z79899 Other long term (current) drug therapy: Secondary | ICD-10-CM

## 2016-01-22 DIAGNOSIS — F259 Schizoaffective disorder, unspecified: Secondary | ICD-10-CM | POA: Diagnosis present

## 2016-01-22 DIAGNOSIS — F1020 Alcohol dependence, uncomplicated: Secondary | ICD-10-CM | POA: Diagnosis present

## 2016-01-22 DIAGNOSIS — G9340 Encephalopathy, unspecified: Secondary | ICD-10-CM

## 2016-01-22 DIAGNOSIS — E871 Hypo-osmolality and hyponatremia: Secondary | ICD-10-CM | POA: Diagnosis present

## 2016-01-22 DIAGNOSIS — Z681 Body mass index (BMI) 19 or less, adult: Secondary | ICD-10-CM

## 2016-01-22 DIAGNOSIS — J9600 Acute respiratory failure, unspecified whether with hypoxia or hypercapnia: Secondary | ICD-10-CM

## 2016-01-22 DIAGNOSIS — F10921 Alcohol use, unspecified with intoxication delirium: Secondary | ICD-10-CM

## 2016-01-22 DIAGNOSIS — J9602 Acute respiratory failure with hypercapnia: Secondary | ICD-10-CM | POA: Diagnosis present

## 2016-01-22 DIAGNOSIS — Z915 Personal history of self-harm: Secondary | ICD-10-CM

## 2016-01-22 DIAGNOSIS — F514 Sleep terrors [night terrors]: Secondary | ICD-10-CM | POA: Diagnosis present

## 2016-01-22 DIAGNOSIS — F410 Panic disorder [episodic paroxysmal anxiety] without agoraphobia: Secondary | ICD-10-CM | POA: Diagnosis present

## 2016-01-22 DIAGNOSIS — T50901A Poisoning by unspecified drugs, medicaments and biological substances, accidental (unintentional), initial encounter: Secondary | ICD-10-CM | POA: Diagnosis present

## 2016-01-22 DIAGNOSIS — J9601 Acute respiratory failure with hypoxia: Secondary | ICD-10-CM

## 2016-01-22 LAB — BASIC METABOLIC PANEL
ANION GAP: 13 (ref 5–15)
BUN: 6 mg/dL (ref 6–20)
CALCIUM: 8.2 mg/dL — AB (ref 8.9–10.3)
CO2: 19 mmol/L — AB (ref 22–32)
CREATININE: 0.68 mg/dL (ref 0.61–1.24)
Chloride: 100 mmol/L — ABNORMAL LOW (ref 101–111)
GFR calc Af Amer: >60 mL/min (ref >60)
GFR calc non Af Amer: >60 mL/min (ref >60)
GLUCOSE: 87 mg/dL (ref 65–99)
Potassium: 3.5 mmol/L (ref 3.5–5.1)
Sodium: 132 mmol/L — ABNORMAL LOW (ref 135–145)

## 2016-01-22 LAB — TRIGLYCERIDES: TRIGLYCERIDES: 33 mg/dL (ref <150)

## 2016-01-22 LAB — CK TOTAL AND CKMB (NOT AT ARMC)
CK TOTAL: 173 U/L (ref 49–397)
CK, MB: 3 ng/mL (ref 0.5–5.0)
Relative Index: 1.7 (ref 0.0–2.5)

## 2016-01-22 LAB — RAPID URINE DRUG SCREEN, HOSP PERFORMED
Amphetamines: NOT DETECTED
BARBITURATES: NOT DETECTED
Benzodiazepines: NOT DETECTED
COCAINE: NOT DETECTED
Opiates: NOT DETECTED
Tetrahydrocannabinol: NOT DETECTED

## 2016-01-22 LAB — CBC
HCT: 39.3 % (ref 39.0–52.0)
Hemoglobin: 13.9 g/dL (ref 13.0–17.0)
MCH: 34.1 pg — ABNORMAL HIGH (ref 26.0–34.0)
MCHC: 35.4 g/dL (ref 30.0–36.0)
MCV: 96.3 fL (ref 78.0–100.0)
PLATELETS: 218 10*3/uL (ref 150–400)
RBC: 4.08 MIL/uL — ABNORMAL LOW (ref 4.22–5.81)
RDW: 13.2 % (ref 11.5–15.5)
WBC: 5.1 10*3/uL (ref 4.0–10.5)

## 2016-01-22 LAB — URINALYSIS, ROUTINE W REFLEX MICROSCOPIC
Bilirubin Urine: NEGATIVE
Glucose, UA: NEGATIVE mg/dL
Hgb urine dipstick: NEGATIVE
Ketones, ur: NEGATIVE mg/dL
LEUKOCYTES UA: NEGATIVE
Nitrite: NEGATIVE
PROTEIN: NEGATIVE mg/dL
SPECIFIC GRAVITY, URINE: 1.004 — AB (ref 1.005–1.030)
pH: 7 (ref 5.0–8.0)

## 2016-01-22 LAB — POCT I-STAT 3, ART BLOOD GAS (G3+)
ACID-BASE DEFICIT: 1 mmol/L (ref 0.0–2.0)
Bicarbonate: 21.6 mmol/L (ref 20.0–28.0)
O2 SAT: 94 %
PCO2 ART: 29.2 mmHg — AB (ref 32.0–48.0)
PH ART: 7.477 — AB (ref 7.350–7.450)
PO2 ART: 64 mmHg — AB (ref 83.0–108.0)
Patient temperature: 98.6
TCO2: 22 mmol/L (ref 0–100)

## 2016-01-22 LAB — COMPREHENSIVE METABOLIC PANEL
ALT: 27 U/L (ref 17–63)
ANION GAP: 13 (ref 5–15)
AST: 49 U/L — ABNORMAL HIGH (ref 15–41)
Albumin: 4.8 g/dL (ref 3.5–5.0)
Alkaline Phosphatase: 43 U/L (ref 38–126)
BUN: <5 mg/dL — ABNORMAL LOW (ref 6–20)
CALCIUM: 8.7 mg/dL — AB (ref 8.9–10.3)
CHLORIDE: 94 mmol/L — AB (ref 101–111)
CO2: 22 mmol/L (ref 22–32)
CREATININE: 0.57 mg/dL — AB (ref 0.61–1.24)
Glucose, Bld: 119 mg/dL — ABNORMAL HIGH (ref 65–99)
Potassium: 4.5 mmol/L (ref 3.5–5.1)
SODIUM: 129 mmol/L — AB (ref 135–145)
Total Bilirubin: 1.1 mg/dL (ref 0.3–1.2)
Total Protein: 7.2 g/dL (ref 6.5–8.1)

## 2016-01-22 LAB — SALICYLATE LEVEL

## 2016-01-22 LAB — CBC WITH DIFFERENTIAL/PLATELET
Basophils Absolute: 0.1 10*3/uL (ref 0.0–0.1)
Basophils Relative: 1 %
EOS ABS: 0.2 10*3/uL (ref 0.0–0.7)
EOS PCT: 3 %
HCT: 39.1 % (ref 39.0–52.0)
Hemoglobin: 13.9 g/dL (ref 13.0–17.0)
LYMPHS ABS: 2.1 10*3/uL (ref 0.7–4.0)
LYMPHS PCT: 40 %
MCH: 34.2 pg — AB (ref 26.0–34.0)
MCHC: 35.5 g/dL (ref 30.0–36.0)
MCV: 96.1 fL (ref 78.0–100.0)
MONO ABS: 0.2 10*3/uL (ref 0.1–1.0)
MONOS PCT: 5 %
Neutro Abs: 2.7 10*3/uL (ref 1.7–7.7)
Neutrophils Relative %: 51 %
PLATELETS: 238 10*3/uL (ref 150–400)
RBC: 4.07 MIL/uL — AB (ref 4.22–5.81)
RDW: 13.1 % (ref 11.5–15.5)
WBC: 5.3 10*3/uL (ref 4.0–10.5)

## 2016-01-22 LAB — PROTIME-INR
INR: 0.94
Prothrombin Time: 12.6 seconds (ref 11.4–15.2)

## 2016-01-22 LAB — LIPASE, BLOOD: LIPASE: 27 U/L (ref 11–51)

## 2016-01-22 LAB — GLUCOSE, CAPILLARY
GLUCOSE-CAPILLARY: 85 mg/dL (ref 65–99)
GLUCOSE-CAPILLARY: 97 mg/dL (ref 65–99)
Glucose-Capillary: 86 mg/dL (ref 65–99)
Glucose-Capillary: 101 mg/dL — ABNORMAL HIGH (ref 65–99)
Glucose-Capillary: 117 mg/dL — ABNORMAL HIGH (ref 65–99)
Glucose-Capillary: 109 mg/dL — ABNORMAL HIGH (ref 65–99)

## 2016-01-22 LAB — AMYLASE: AMYLASE: 47 U/L (ref 28–100)

## 2016-01-22 LAB — CREATININE, SERUM
CREATININE: 0.55 mg/dL — AB (ref 0.61–1.24)
GFR calc Af Amer: >60 mL/min (ref >60)

## 2016-01-22 LAB — PHOSPHORUS: Phosphorus: 2.7 mg/dL (ref 2.5–4.6)

## 2016-01-22 LAB — MRSA PCR SCREENING: MRSA BY PCR: NEGATIVE

## 2016-01-22 LAB — I-STAT CG4 LACTIC ACID, ED: LACTIC ACID, VENOUS: 2.33 mmol/L — AB (ref 0.5–1.9)

## 2016-01-22 LAB — I-STAT CHEM 8, ED
BUN: 3 mg/dL — AB (ref 6–20)
CALCIUM ION: 0.92 mmol/L — AB (ref 1.15–1.40)
CREATININE: 0.7 mg/dL (ref 0.61–1.24)
Chloride: 95 mmol/L — ABNORMAL LOW (ref 101–111)
GLUCOSE: 110 mg/dL — AB (ref 65–99)
HEMATOCRIT: 43 % (ref 39.0–52.0)
HEMOGLOBIN: 14.6 g/dL (ref 13.0–17.0)
Potassium: 4.7 mmol/L (ref 3.5–5.1)
Sodium: 130 mmol/L — ABNORMAL LOW (ref 135–145)
TCO2: 25 mmol/L (ref 0–100)

## 2016-01-22 LAB — I-STAT ARTERIAL BLOOD GAS, ED
Acid-Base Excess: 1 mmol/L (ref 0.0–2.0)
Bicarbonate: 28.1 mmol/L — ABNORMAL HIGH (ref 20.0–28.0)
O2 SAT: 100 %
PH ART: 7.348 — AB (ref 7.350–7.450)
TCO2: 30 mmol/L (ref 0–100)
pCO2 arterial: 51.2 mmHg — ABNORMAL HIGH (ref 32.0–48.0)
pO2, Arterial: 511 mmHg — ABNORMAL HIGH (ref 83.0–108.0)

## 2016-01-22 LAB — ACETAMINOPHEN LEVEL

## 2016-01-22 LAB — I-STAT TROPONIN, ED: Troponin i, poc: 0.01 ng/mL (ref 0.00–0.08)

## 2016-01-22 LAB — MAGNESIUM: Magnesium: 1.9 mg/dL (ref 1.7–2.4)

## 2016-01-22 LAB — TROPONIN I: Troponin I: <0.03 ng/mL (ref <0.03)

## 2016-01-22 LAB — ETHANOL: ALCOHOL ETHYL (B): 261 mg/dL — AB (ref <5)

## 2016-01-22 MED ORDER — FENTANYL CITRATE (PF) 100 MCG/2ML IJ SOLN
100.0000 ug | INTRAMUSCULAR | Status: DC | PRN
  Administered 2016-01-22: 100 ug via INTRAVENOUS

## 2016-01-22 MED ORDER — FOLIC ACID 5 MG/ML IJ SOLN
1.0000 mg | Freq: Every day | INTRAMUSCULAR | Status: DC
  Administered 2016-01-22 – 2016-01-23 (×2): 1 mg via INTRAVENOUS
  Filled 2016-01-22 (×2): qty 0.2

## 2016-01-22 MED ORDER — FAMOTIDINE IN NACL 20-0.9 MG/50ML-% IV SOLN
20.0000 mg | Freq: Two times a day (BID) | INTRAVENOUS | Status: DC
  Administered 2016-01-22 – 2016-01-23 (×3): 20 mg via INTRAVENOUS
  Filled 2016-01-22 (×4): qty 50

## 2016-01-22 MED ORDER — THIAMINE HCL 100 MG/ML IJ SOLN
100.0000 mg | Freq: Every day | INTRAMUSCULAR | Status: DC
  Administered 2016-01-22 – 2016-01-23 (×2): 100 mg via INTRAVENOUS
  Filled 2016-01-22: qty 2
  Filled 2016-01-22: qty 1

## 2016-01-22 MED ORDER — SODIUM CHLORIDE 0.9 % IV BOLUS (SEPSIS)
1000.0000 mL | Freq: Once | INTRAVENOUS | Status: AC
  Administered 2016-01-22: 1000 mL via INTRAVENOUS

## 2016-01-22 MED ORDER — NALOXONE HCL 2 MG/2ML IJ SOSY
2.0000 mg | PREFILLED_SYRINGE | Freq: Once | INTRAMUSCULAR | Status: AC
  Administered 2016-01-22: 2 mg via INTRAVENOUS

## 2016-01-22 MED ORDER — INSULIN ASPART 100 UNIT/ML ~~LOC~~ SOLN: 2.0000 [IU] | SUBCUTANEOUS | Status: DC

## 2016-01-22 MED ORDER — ALBUTEROL SULFATE (2.5 MG/3ML) 0.083% IN NEBU: 2.5000 mg | INHALATION_SOLUTION | RESPIRATORY_TRACT | Status: DC | PRN

## 2016-01-22 MED ORDER — DEXMEDETOMIDINE HCL IN NACL 200 MCG/50ML IV SOLN
0.2000 ug/kg/h | INTRAVENOUS | Status: DC
  Administered 2016-01-22 (×2): 0.4 ug/kg/h via INTRAVENOUS
  Administered 2016-01-22: 0.2 ug/kg/h via INTRAVENOUS
  Filled 2016-01-22: qty 50
  Filled 2016-01-22: qty 100

## 2016-01-22 MED ORDER — FAMOTIDINE IN NACL 20-0.9 MG/50ML-% IV SOLN: 20.0000 mg | Freq: Two times a day (BID) | INTRAVENOUS | Status: DC

## 2016-01-22 MED ORDER — CHLORHEXIDINE GLUCONATE 0.12% ORAL RINSE (MEDLINE KIT)
15.0000 mL | Freq: Two times a day (BID) | OROMUCOSAL | Status: DC
  Administered 2016-01-22 (×2): 15 mL via OROMUCOSAL

## 2016-01-22 MED ORDER — LORAZEPAM 2 MG/ML IJ SOLN: 1.0000 mg | INTRAMUSCULAR | Status: DC | PRN

## 2016-01-22 MED ORDER — ORAL CARE MOUTH RINSE
15.0000 mL | Freq: Four times a day (QID) | OROMUCOSAL | Status: DC
  Administered 2016-01-22 (×3): 15 mL via OROMUCOSAL

## 2016-01-22 MED ORDER — SODIUM CHLORIDE 0.9 % IV SOLN: 250.0000 mL | INTRAVENOUS | Status: DC | PRN

## 2016-01-22 MED ORDER — ETOMIDATE 2 MG/ML IV SOLN
INTRAVENOUS | Status: AC | PRN
  Administered 2016-01-22: 20 mg via INTRAVENOUS

## 2016-01-22 MED ORDER — PROPOFOL 1000 MG/100ML IV EMUL
INTRAVENOUS | Status: AC
  Filled 2016-01-22: qty 100

## 2016-01-22 MED ORDER — HYDRALAZINE HCL 20 MG/ML IJ SOLN: 10.0000 mg | Freq: Once | INTRAMUSCULAR | Status: DC

## 2016-01-22 MED ORDER — ADULT MULTIVITAMIN W/MINERALS CH
1.0000 | ORAL_TABLET | Freq: Every day | ORAL | Status: DC
  Administered 2016-01-22 – 2016-01-24 (×3): 1 via ORAL
  Filled 2016-01-22 (×3): qty 1

## 2016-01-22 MED ORDER — PROPOFOL 1000 MG/100ML IV EMUL
0.0000 ug/kg/min | INTRAVENOUS | Status: DC
  Administered 2016-01-22: 10 ug/kg/min via INTRAVENOUS

## 2016-01-22 MED ORDER — HEPARIN SODIUM (PORCINE) 5000 UNIT/ML IJ SOLN
5000.0000 [IU] | Freq: Three times a day (TID) | INTRAMUSCULAR | Status: DC
  Administered 2016-01-22 – 2016-01-23 (×4): 5000 [IU] via SUBCUTANEOUS
  Filled 2016-01-22 (×7): qty 1

## 2016-01-22 MED ORDER — SUCCINYLCHOLINE CHLORIDE 20 MG/ML IJ SOLN
INTRAMUSCULAR | Status: AC | PRN
  Administered 2016-01-22: 100 mg via INTRAVENOUS

## 2016-01-22 MED ORDER — CHLORDIAZEPOXIDE HCL 5 MG PO CAPS
10.0000 mg | ORAL_CAPSULE | Freq: Three times a day (TID) | ORAL | Status: DC
  Administered 2016-01-22: 10 mg via ORAL
  Filled 2016-01-22: qty 2

## 2016-01-22 MED ORDER — DEXTROSE-NACL 5-0.45 % IV SOLN
INTRAVENOUS | Status: DC
  Administered 2016-01-22 – 2016-01-23 (×4): via INTRAVENOUS

## 2016-01-22 MED ORDER — NALOXONE HCL 2 MG/2ML IJ SOSY
PREFILLED_SYRINGE | INTRAMUSCULAR | Status: AC
  Filled 2016-01-22: qty 2

## 2016-01-22 MED ORDER — FENTANYL CITRATE (PF) 100 MCG/2ML IJ SOLN: 100.0000 ug | INTRAMUSCULAR | Status: DC | PRN

## 2016-01-22 NOTE — Progress Notes (Signed)
CSW consult acknowledged re: intentional overdose. CSW following for psych recommendation, substance abuse concerns, and disposition. Patient presently intubated and on propofol due to lack of synchronization with the vent.     Lance MussAshley Gardner,MSW, LCSW [facility] ED/56M Clinical Social Worker [phone]

## 2016-01-22 NOTE — ED Notes (Signed)
Preparing to intubate.  

## 2016-01-22 NOTE — ED Notes (Signed)
Pupils 2.0 equal and  Reactive  Does not respond to painful stimuli since he arrived

## 2016-01-22 NOTE — ED Notes (Signed)
Critical care here to see wife at the bedside.  She is tearful

## 2016-01-22 NOTE — ED Notes (Signed)
Report called to [id number] rn

## 2016-01-22 NOTE — Progress Notes (Signed)
[facility]  S: comfortable on vent overnight, reaching for tube intermittently, periods of agitation at times.  O: Vitals:   01/22/16 0745 01/22/16 0800 01/22/16 0802 01/22/16 0815  BP: (!) 81/50 111/70 (!) 81/50 105/70  Pulse: 70 66 (!) 103 68  Resp: 20 (!) 7 (!) 6 20  Temp: 97.9 F (36.6 C) 97.9 F (36.6 C)  98.1 F (36.7 C)  TempSrc:      SpO2: 100% 100% 100% 100%  Weight:      Height:       Vent Mode: PRVC FiO2 (%):  [40 %-100 %] 40 % Set Rate:  [16 bmp-20 bmp] 20 bmp Vt Set:  [640 mL] 640 mL PEEP:  [5 cmH20] 5 cmH20 Plateau Pressure:  [15 cmH20-17 cmH20] 15 cmH20  Gen: sedated on vent HENT: NCAT ETT in place PULM: CTA B vent supported breaths CV: RRR, no mgr GI: BS+, soft, nontender MSK: normal bulk and tone Neuro: sedated but will nod head to questions  CXR> clear, ETT in place  BMET    Component Value Date/Time   NA 130 (L) 01/22/2016 0217   K 4.7 01/22/2016 0217   CL 95 (L) 01/22/2016 0217   CO2 22 01/22/2016 0207   GLUCOSE 110 (H) 01/22/2016 0217   BUN 3 (L) 01/22/2016 0217   CREATININE 0.55 (L) 01/22/2016 0415   CALCIUM 8.7 (L) 01/22/2016 0207   GFRNONAA >60 01/22/2016 0415   GFRAA >60 01/22/2016 0415    Impression: Acute respiratory failure with hypoxemia Alcohol intoxication Drug overdose, unclear if intentional Bipolar disorder with recent severe mania per wife  Plan summary: > good vent mechanics, no evidence of pneumonia, suspect he can be extubated today.  Plan to change to precedex now, add scheduled librium, add thiamine, folate.   Family updated bedside at length Wife asks we not discuss the alcohol use with his mother  My cc time 45 minutes in addition to Dr. Christene Slatese Dios  Heber CarolinaBrent McQuaid, MD [place] [facility] Pager: [phone] Cell: [phone] After 3pm or if no response, call [phone]

## 2016-01-22 NOTE — ED Notes (Signed)
#  16 og per [place]

## 2016-01-22 NOTE — ED Notes (Signed)
Etomidate 20mg  iv and succ 100mg  given morgan brown rn

## 2016-01-22 NOTE — Progress Notes (Signed)
PCCM Progress Note  Pt on PS 5/5 on and off throughout the afternoon, had issues with hypersomnolence earlier. Now awake and following all commands.  Vitals stable.  Pulling strong volumes on vent.  OK for extubation.  Sitter at bedside post extubation for suicide precautions.  Risk of requiring re-intubation if fails explained to pt and family at bedside.   Rutherford Guysahul Tesa Meadors, [place] - [facility] Pager: [phone]  or [phone] 01/22/2016, 7:10 PM

## 2016-01-22 NOTE — Progress Notes (Signed)
[facility]  Becoming more awake, but still too sleepy Stop librium Minimal precedex Extubate when able, will continue to re-assess today  Heber CarolinaBrent McQuaid, MD [facility] Pager: [phone] Cell: [phone] After 3pm or if no response, call [phone]

## 2016-01-22 NOTE — ED Notes (Signed)
The pts bp has been high until 15 minutes ago   Hydralazine not given  Because the bp is dropping

## 2016-01-22 NOTE — ED Triage Notes (Signed)
The pt arrived by gems from home  He took a bottle of zyprexa  One hour ago  His wife waited for 30 minutes before she called ems.  He has had narcan with no response by ems    Iv per ems

## 2016-01-22 NOTE — Progress Notes (Signed)
Vent changes made post ABG.

## 2016-01-22 NOTE — ED Provider Notes (Signed)
[facility]-EMERGENCY DEPT Provider Note   CSN: [id number] Arrival date & time:     By signing my name below, I, Nicholas Melton, attest that this documentation has been prepared under the direction and in the presence of Nicholas Melton Obst, MD . Electronically Signed: Freida Busmaniana Melton, Scribe. 01/22/2016. 2:20 AM.  History   Chief Complaint Chief Complaint  Patient presents with  . Drug Overdose   LEVEL 5 CAVEAT DUE TO ACUITY OF MEDICAL CONDITION   The history is provided by the EMS personnel. No language interpreter was used.   HPI Comments:  Nicholas Melton is a 45 y.o. male brought in by ambulance, who presents to the Emergency Department unresponsive s/p drug overdose. EMS reports ingestion of ETOH, Klonopin, Gabapentin, and Zyprexa (unknown amounts) ~ 1 hour PTA. Pt has an h/o intentional OD with Benadryl per EMS. He was given 2 mg Narcan en route without improvement. Blood sugar was 148. EMS notes pt is somewhat responsive to sternal rub.   Past Medical History:  Diagnosis Date  . Anxiety   . Bipolar 2 disorder (HCC)   . Depression   . Hypertension   . Night terrors, adult   . OCD (obsessive compulsive disorder)   . Panic attacks   . Posttraumatic stress disorder   . Social phobia     Patient Active Problem List   Diagnosis Date Noted  . Alcohol use disorder, severe, dependence (HCC) 02/14/2015  . Bipolar 2 disorder, major depressive episode (HCC) 02/14/2015  . Alcohol dependence (HCC) 06/17/2011  . MDD (major depressive disorder) 06/15/2011  . Alcohol consumption of more than four drinks per day 06/15/2011  . PTSD (post-traumatic stress disorder) 06/15/2011    Class: Acute  . Homeless 06/15/2011    Past Surgical History:  Procedure Laterality Date  . THYROIDECTOMY    . VASECTOMY         Home Medications    Prior to Admission medications   Medication Sig Start Date End Date Taking? Authorizing Provider  buPROPion (WELLBUTRIN XL) 150 MG 24 hr tablet Take 1 tablet (150  mg total) by mouth daily. 02/15/15   Oneta Rackanika N Lewis, NP  cetirizine (ZYRTEC) 10 MG tablet Take 10 mg by mouth daily.    Historical Provider, MD  clonazePAM (KLONOPIN) 1 MG tablet Take 1 mg by mouth 2 (two) times daily.    Historical Provider, MD  gabapentin (NEURONTIN) 400 MG capsule Take 400 mg by mouth 3 (three) times daily.    Historical Provider, MD  lurasidone 60 MG TABS Take 60 mg by mouth daily with breakfast. 02/15/15   Oneta Rackanika N Lewis, NP  mirtazapine (REMERON) 30 MG tablet Take 30 mg by mouth at bedtime.    Historical Provider, MD  traZODone (DESYREL) 50 MG tablet Take 1 tablet (50 mg total) by mouth at bedtime as needed for sleep (may repeat X 1). 02/15/15   Oneta Rackanika N Lewis, NP    Family History Family History  Problem Relation Age of Onset  . Cancer Brother     Social History Social History  Substance Use Topics  . Smoking status: Current Every Day Smoker    Packs/day: 2.00    Years: 15.00    Types: Cigarettes  . Smokeless tobacco: Former NeurosurgeonUser  . Alcohol use 67.2 oz/week    112 Cans of beer per week     Allergies   Gabapentin; Risperidone and related; Seroquel [quetiapine]; and Depakote er [divalproex sodium er]   Review of Systems Review of Systems  Unable to  perform ROS: Acuity of condition   Physical Exam Updated Vital Signs Ht 6\' 1"  (1.854 m)   Wt 149 lb 14.6 oz (68 kg)   SpO2 100%   BMI 19.78 kg/m   Physical Exam  Constitutional: He appears well-developed and well-nourished.  Obtunded  HENT:  Head: Normocephalic and atraumatic.  Mouth/Throat: No oropharyngeal exudate.  No gag  Eyes:  Pupils 3mm and sluggish   Neck: Normal range of motion. Neck supple.  No meningismus.  Cardiovascular: Regular rhythm, normal heart sounds and intact distal pulses.  Tachycardia present.   No murmur heard. Pulmonary/Chest:  Shallow respirations  Breathing ~ 8-10 a minute  Equal breath sounds  Abdominal: Soft. There is no tenderness. There is no rebound and no  guarding.  Musculoskeletal: Normal range of motion. He exhibits no edema or tenderness.  Neurological:  Unresponsive; no spontaneous movement  Skin: Skin is warm.  Nursing note and vitals reviewed.    ED Treatments / Results  DIAGNOSTIC STUDIES:  Oxygen Saturation is 100% on  normal  by my interpretation.     Labs (all labs ordered are listed, but only abnormal results are displayed) Labs Reviewed  CBC WITH DIFFERENTIAL/PLATELET - Abnormal; Notable for the following:       Result Value   RBC 4.07 (*)    MCH 34.2 (*)    All other components within normal limits  COMPREHENSIVE METABOLIC PANEL - Abnormal; Notable for the following:    Sodium 129 (*)    Chloride 94 (*)    Glucose, Bld 119 (*)    BUN <5 (*)    Creatinine, Ser 0.57 (*)    Calcium 8.7 (*)    AST 49 (*)    All other components within normal limits  ACETAMINOPHEN LEVEL - Abnormal; Notable for the following:    Acetaminophen (Tylenol), Serum <10 (*)    All other components within normal limits  ETHANOL - Abnormal; Notable for the following:    Alcohol, Ethyl (B) 261 (*)    All other components within normal limits  URINALYSIS, ROUTINE W REFLEX MICROSCOPIC (NOT AT [facility]) - Abnormal; Notable for the following:    Specific Gravity, Urine 1.004 (*)    All other components within normal limits  CBC - Abnormal; Notable for the following:    RBC 4.08 (*)    MCH 34.1 (*)    All other components within normal limits  CREATININE, SERUM - Abnormal; Notable for the following:    Creatinine, Ser 0.55 (*)    All other components within normal limits  GLUCOSE, CAPILLARY - Abnormal; Notable for the following:    Glucose-Capillary 101 (*)    All other components within normal limits  I-STAT ARTERIAL BLOOD GAS, ED - Abnormal; Notable for the following:    pH, Arterial 7.348 (*)    pCO2 arterial 51.2 (*)    pO2, Arterial 511.0 (*)    Bicarbonate 28.1 (*)    All other components within normal limits  I-STAT CG4 LACTIC  ACID, ED - Abnormal; Notable for the following:    Lactic Acid, Venous 2.33 (*)    All other components within normal limits  I-STAT CHEM 8, ED - Abnormal; Notable for the following:    Sodium 130 (*)    Chloride 95 (*)    BUN 3 (*)    Glucose, Bld 110 (*)    Calcium, Ion 0.92 (*)    All other components within normal limits  MRSA PCR SCREENING  SALICYLATE LEVEL  RAPID URINE DRUG SCREEN, HOSP PERFORMED  TROPONIN I  PROTIME-INR  TRIGLYCERIDES  CK TOTAL AND CKMB (NOT AT [facility])  AMYLASE  LIPASE, BLOOD  TROPONIN I  GLUCOSE, CAPILLARY  BASIC METABOLIC PANEL  MAGNESIUM  PHOSPHORUS  BLOOD GAS, ARTERIAL  TROPONIN I  TROPONIN I  I-STAT TROPOININ, ED  I-STAT CG4 LACTIC ACID, ED    EKG  EKG Interpretation  Date/Time:  Thursday January 22 2016 02:01:45 EST Ventricular Rate:  61 PR Interval:    QRS Duration: 112 QT Interval:  451 QTC Calculation: 455 R Axis:   85 Text Interpretation:  Sinus rhythm RSR' in V1 or V2, probably normal variant Probable left ventricular hypertrophy Probable inferior infarct, old No significant change was found Confirmed by Manus Gunning  MD, Billyjoe Go [phone]) on 01/22/2016 3:04:55 AM       Radiology Ct Head Wo Contrast  Result Date: 01/22/2016 CLINICAL DATA:  Drug overdose.  Unresponsive. EXAM: CT HEAD WITHOUT CONTRAST TECHNIQUE: Contiguous axial images were obtained from the base of the skull through the vertex without intravenous contrast. COMPARISON:  None. FINDINGS: Brain: No evidence of malformation, atrophy, old or acute small or large vessel infarction, mass lesion, hemorrhage, hydrocephalus or extra-axial collection. No evidence of pituitary lesion. Vascular: No vascular calcification.  No hyperdense vessels. Skull: Normal.  No fracture or focal bone lesion. Sinuses/Orbits: There is mucosal thickening affecting the maxillary and ethmoid sinuses and to a lesser extent the frontal sinuses. No fluid in the middle ears or mastoids. Visualized orbits are  normal. Other: None significant IMPRESSION: Normal appearance of the brain. Mild mucosal inflammation of the paranasal sinuses. Electronically Signed   By: Paulina Fusi M.D.   On: 01/22/2016 07:28   Dg Chest Portable 1 View  Result Date: 01/22/2016 CLINICAL DATA:  Overdose.  Endotracheal and enteric tube placements. EXAM: PORTABLE CHEST 1 VIEW COMPARISON:  09/17/2015 FINDINGS: Endotracheal tube present with tip measuring 7.6 cm above the carina. Enteric tube tip is in the left upper quadrant consistent with location in the upper stomach. Shallow inspiration. Normal heart size and pulmonary vascularity. No focal airspace disease or consolidation in the lungs. No blunting of costophrenic angles. No pneumothorax. Metallic piercings in the mid chest. IMPRESSION: Appliances appear in satisfactory position. Shallow inspiration. No evidence of active pulmonary disease. Electronically Signed   By: Burman Nieves M.D.   On: 01/22/2016 02:26    Procedures Procedures    EMERGENT INTUBATION PROCEDURE NOTE INDICATION: potential airway compromise  TECHNIQUE: Unable to obtain consent because of emergent medical necessity.  After pre-oxygenating the patient for 3 minutes, a modified rapid-sequence induction was performed using 20mg  etomidate and100mg  succinylcholine with cricoid pressure. Using a Miller 4 laryngoscope blade and 7.75mm cuffed endotracheal tube was placed and secured.  Placement was confirmed with by auscultation and by CXR.  COMPLICATIONS: None. The patient tolerated the procedure well with no complications. POST PROCEDURE CXR: pending       Medications Ordered in ED Medications  naloxone (NARCAN) 2 MG/2ML injection (not administered)  propofol (DIPRIVAN) 1000 MG/100ML infusion (not administered)  sodium chloride 0.9 % bolus 1,000 mL (not administered)  naloxone (NARCAN) injection 2 mg (2 mg Intravenous Given 01/22/16 0203)  etomidate (AMIDATE) injection (20 mg Intravenous Given 01/22/16  0206)  succinylcholine (ANECTINE) injection (100 mg Intravenous Given 01/22/16 0208)     Initial Impression / Assessment and Plan / ED Course  I have reviewed the triage vital signs and the nursing notes.  Pertinent labs & imaging results that were available  during my care of the patient were reviewed by me and considered in my medical decision making (see chart for details).  Clinical Course    Patient presents after intentional polysubstance overdose including alcohol, Klonopin, Zyprexa, gabapentin. Wife reports patient's been drinking heavily all day and took an entire bottle of Zyprexa as well as his normal doses of Neurontin and Klonopin.  Patient obtunded on arrival, not protecting airway, no gag, intubated as above.  EKG shows normal intervals and no prolonged QT. Tox labs are remarkable for alcohol intoxication. Acetaminophen and salicylate negative. Normal anion gap.  Patient initially hypertensive which has improved without medication. Propofol started for vent asynchrony.   Admission d/w Dr. Dellie Catholic of critical care.  Poison center contacted by nursing staff.     CRITICAL CARE Performed by: Nicholas Octave, MD Total critical care time: 45 minutes Critical care time was exclusive of separately billable procedures and treating other patients. Critical care was necessary to treat or prevent imminent or life-threatening deterioration. Critical care was time spent personally by me on the following activities: development of treatment plan with patient and/or surrogate as well as nursing, discussions with consultants, evaluation of patient's response to treatment, examination of patient, obtaining history from patient or surrogate, ordering and performing treatments and interventions, ordering and review of laboratory studies, ordering and review of radiographic studies, pulse oximetry and re-evaluation of patient's condition.   Final Clinical Impressions(s) / ED Diagnoses    Final diagnoses:  Acute respiratory failure, unspecified whether with hypoxia or hypercapnia (HCC)  Acute encephalopathy  Intentional drug overdose, initial encounter [facility])    New Prescriptions New Prescriptions   No medications on file   I personally performed the services described in this documentation, which was scribed in my presence. The recorded information has been reviewed and is accurate.     Nicholas Octave, MD 01/22/16 0800

## 2016-01-22 NOTE — ED Notes (Signed)
Another doctor is at the bedside

## 2016-01-22 NOTE — H&P (Signed)
PULMONARY / CRITICAL CARE MEDICINE   Name: Nicholas Melton MRN: [id number] DOB: 1970-09-18    ADMISSION DATE:  01/22/2016  CHIEF COMPLAINT:  Intentional Overdose   HISTORY OF PRESENT ILLNESS:   Nicholas Melton is 45 y.o. male with significant PMH of psychiatric disorders, substance abuse and history of previous intentional overdose with Benadryl who presented unresponsive by EMS s/p drug overdose. Wife of patient was at bedside and provided the history. Patient is followed by [facility] and was recently started on Zyprexa. He has been drinking large amounts of alcohol these past few days. Wife estimates he drank three 12 packs of beer since morning of 11/7. Wife and patient had been planning to bring him to [facility] for alcohol detox on day of admission. Patient apparently took Klonopin, Gabapentin, and entire bottle of Zyprexa in additional to alcohol ingestion approximately one hour prior to arrival in ED. In route he was given Narcan 2 mg without improvement. In the ED, he was intubated due to inability to protect his airway. Was given propofol due to lack of synchronization with the vent. Labs were significant for ethanol level of 261. Tylenol and Aspirin levels were negative. UDS was pending at time of admission.   PAST MEDICAL HISTORY :  He  has a past medical history of Anxiety; Bipolar 2 disorder (HCC); Depression; Hypertension; Night terrors, adult; OCD (obsessive compulsive disorder); Panic attacks; Posttraumatic stress disorder; and Social phobia.  PAST SURGICAL HISTORY: He  has a past surgical history that includes Vasectomy and Thyroidectomy.  Allergies  Allergen Reactions  . Gabapentin Nausea And Vomiting and Other (See Comments)    lethargic  . Risperidone And Related Other (See Comments)    Faint, dizzy, lethargic   . Seroquel [Quetiapine] Other (See Comments)    Faint, dizzy, lethargic   . Depakote Er [Divalproex Sodium Er] Diarrhea    No current facility-administered  medications on file prior to encounter.    Current Outpatient Prescriptions on File Prior to Encounter  Medication Sig  . buPROPion (WELLBUTRIN XL) 150 MG 24 hr tablet Take 1 tablet (150 mg total) by mouth daily.  . cetirizine (ZYRTEC) 10 MG tablet Take 10 mg by mouth daily.  . clonazePAM (KLONOPIN) 1 MG tablet Take 1 mg by mouth 2 (two) times daily.  Marland Kitchen gabapentin (NEURONTIN) 400 MG capsule Take 400 mg by mouth 3 (three) times daily.  Marland Kitchen lurasidone 60 MG TABS Take 60 mg by mouth daily with breakfast.  . mirtazapine (REMERON) 30 MG tablet Take 30 mg by mouth at bedtime.  . traZODone (DESYREL) 50 MG tablet Take 1 tablet (50 mg total) by mouth at bedtime as needed for sleep (may repeat X 1).    FAMILY HISTORY:  His indicated that his mother is alive. He indicated that the status of his brother is unknown. He indicated that his other is alive.    SOCIAL HISTORY: He  reports that he has been smoking Cigarettes.  He has a 30.00 pack-year smoking history. He has quit using smokeless tobacco. He reports that he drinks about 67.2 oz of alcohol per week . He reports that he uses drugs, including Cocaine.  REVIEW OF SYSTEMS:   Unable to obtain due to patient sedation.   SUBJECTIVE:  Patient is sedated at time of admission. Wife at bedside to provide history as above.   VITAL SIGNS: BP (!) 187/126   Pulse 76   Temp (!) 96 F (35.6 C) (Rectal)   Resp 16   Ht 6'  1" (1.854 m)   Wt 149 lb 14.6 oz (68 kg)   SpO2 100%   BMI 19.78 kg/m   HEMODYNAMICS:    VENTILATOR SETTINGS: Vent Mode: PRVC FiO2 (%):  [40 %-100 %] 40 % Set Rate:  [16 bmp-20 bmp] 20 bmp Vt Set:  [640 mL] 640 mL Plateau Pressure:  [16 cmH20-17 cmH20] 17 cmH20  INTAKE / OUTPUT: No intake/output data recorded.  PHYSICAL EXAMINATION: General:  Sedated male lying in bed  Neuro:  Sedated. No response to tactile stimuli or voice.  HEENT:  Eyes closed. ETT in place.  Cardiovascular:  RRR. No murmur appreciated.  Lungs:   Symmetrical chest rise on ventilator. Mild coarse breath sounds bilaterally.  Abdomen:  Soft, NTND  Musculoskeletal:  No spontaneous movement.  Skin:  Warm and dry. No obvious rashes over exposed skin.   LABS:  BMET  Recent Labs Lab 01/22/16 0207 01/22/16 0217  NA 129* 130*  K 4.5 4.7  CL 94* 95*  CO2 22  --   BUN <5* 3*  CREATININE 0.57* 0.70  GLUCOSE 119* 110*    Electrolytes  Recent Labs Lab 01/22/16 0207  CALCIUM 8.7*    CBC  Recent Labs Lab 01/22/16 0207 01/22/16 0217  WBC 5.3  --   HGB 13.9 14.6  HCT 39.1 43.0  PLT 238  --     Coag's  Recent Labs Lab 01/22/16 0207  INR 0.94    Sepsis Markers  Recent Labs Lab 01/22/16 0218  LATICACIDVEN 2.33*    ABG  Recent Labs Lab 01/22/16 0312  PHART 7.348*  PCO2ART 51.2*  PO2ART 511.0*    Liver Enzymes  Recent Labs Lab 01/22/16 0207  AST 49*  ALT 27  ALKPHOS 43  BILITOT 1.1  ALBUMIN 4.8    Cardiac Enzymes  Recent Labs Lab 01/22/16 0207  TROPONINI <0.03    Glucose No results for input(s): GLUCAP in the last 168 hours.  Imaging Dg Chest Portable 1 View  Result Date: 01/22/2016 CLINICAL DATA:  Overdose.  Endotracheal and enteric tube placements. EXAM: PORTABLE CHEST 1 VIEW COMPARISON:  09/17/2015 FINDINGS: Endotracheal tube present with tip measuring 7.6 cm above the carina. Enteric tube tip is in the left upper quadrant consistent with location in the upper stomach. Shallow inspiration. Normal heart size and pulmonary vascularity. No focal airspace disease or consolidation in the lungs. No blunting of costophrenic angles. No pneumothorax. Metallic piercings in the mid chest. IMPRESSION: Appliances appear in satisfactory position. Shallow inspiration. No evidence of active pulmonary disease. Electronically Signed   By: Burman NievesWilliam  Stevens M.D.   On: 01/22/2016 02:26     STUDIES:  CXR 11/9: No active pulmonary process.   CULTURES: None   ANTIBIOTICS: None   SIGNIFICANT  EVENTS: 11/9: Admitted to ICU. Intubated in ED secondary to unresponsiveness after intentional overdose.   LINES/TUBES: ETT 11/9 >>  PIV x2  Foley catheter 11/9 >>   DISCUSSION: Gloriann LoanMarcus Melton is 45 y.o. male with PMH of bipolar disorder, schizoaffective disorder, PTSD, anxiety/depression, current EtOH abuse, history of illicit substance abuse and past h/o intentional overdose who presents s/p intentional overdose with alcohol, gabapentin, zyprexa, and klonopin. Patient required intubation in the ED due to inability to protect airway.   ASSESSMENT / PLAN:  PULMONARY A: Inability to protect airway 2/2 to encephalopathy s/p intentional ingestion.  P:   Ventilator support  Intermittent CXR/ABG   CARDIOVASCULAR A:  Hypertension, resolved  Negative troponin  P:  Serial EKGs due to ingestion  RENAL A:   Hyponatremia, likely secondary to beer potomania  P:   Monitor BMET  Replete electrolytes prn   GASTROINTESTINAL A:   Mildly elevated AST   P:   NPO  IV Pepcid  Monitor LFTs    HEMATOLOGIC A:   DVT Prophylaxis  P:  Follow CBC  Heparin SQ   INFECTIOUS A:   No acute process  P:   Monitor    ENDOCRINE A:   No acute process   P:   Monitor   NEUROLOGIC A:   Acute encephalopathy secondary to intentional overdose  History of EtOH withdrawal   P:   CT head pending  RASS goal: -1/-2  Propofol  Fentanyl prn  Monitor for alcohol withdrawal  UDS pending     FAMILY  - Updates: Wife at bedside   - Inter-disciplinary family meet or Palliative Care meeting due by:  day 7 after admission, Jan 27 2016.     Pulmonary and Critical Care Medicine [facility] Pager: [phone]  01/22/2016, 3:46 AM    ATTENDING NOTE / ATTESTATION NOTE :   I have discussed the case with the resident/APP Dr. Marcy Siren and Joneen Roach.   I agree with the resident/APP's  history, physical examination, assessment, and plans.    I have edited the  above note and modified it according to our agreed history, physical examination, assessment and plan.   Patient with significant psychiatric disorders of bipolar, depression, schizoaffective disorder, alcohol abuse, anxiety, with previous intentional overdose with Benadryl who presented to the emergency room after ingesting a bottle of Zyprexa. Patient has been more depressed recently and has been consuming more alcohol especially the last 2 days. His psychiatric meds were adjusted recently and he was switched to Zyprexa. After ingesting the bottle of Zyprexa, wife saw him and she ended up calling EMS. He was given Narcan without much improvement. He was intubated for airway protection. No other significant medical history.  Physical examination as above. Blood pressure 112/75, heart rate 60, respiratory 20, 100% O2 sats ration on 40% FiO2. He was hypothermic. Patient was seen, sedated, intubated, comfortable. Pupils 3 mm, sluggish bilateral. (-) lateralizing sings noted. CN grossly unremarkable. ETT in place. (-) NVD. Good S1 and S2. No murmurs. Good air entry. Clear to auscultation. Good bowel sounds. Soft, nontender. Extremities were warm. No edema.  Labs reviewed. WBC 5. Hemoglobin 14. ABG pH 7.35/51/511. Ventilator settings have since been adjusted after ABG. Lactic acid 2.33. Creatinine 0.7. Bicarbonate 22. Sodium 130. Troponin was 0.01. Alcohol level was 261. Drug screen in the urine was negative. EKG showed sinus rhythm, LVH. No ischemic changes. No significant change compared to EKG done in July 2017. CXR with mild cardiomegaly.   Assessment: 1. Zyprexa overdose. Most likely intentional. Patient with previous history of intentional overdose. Patient with mental health issues of depression, bipolar disorder, schizoaffective disorder. 2. Alcohol intoxication. 3. Acute hypoxemic hypercapnic respiratory failure secondary to unable to protect airway. 4. Lactic acid is elevated related to above. His  presentation is not consistent with sepsis.   Plan: 1. Continue ventilatory support for now. Repeat ABG this morning. I don't think he'll be ready for extubation this morning. I think he will go into florid DTs before we can extubate him. Saying that, he may need to be on the ventilator for a couple days to ride out his DTs. Of course, we can always try him on Precedex and do daily weaning as tolerated. 2. Watch out for symptoms  and signs of Zyprexa overdose. Will check for rhabdo, pancreatitis.  3. Cont IVF.  4. Keep NPO for now.  5. No signs of infection. Hold off on abx.  6. Currently on propofol drip. Suggest to reassess patient for WAT  And PST this am.  7. Trend troponin. 8. DVT prophylaxis.  9. Patient will need inpatient psychiatry for his mental health issues once improved from this overdose. He will need case management involvement as he does not have any medical insurance.  I have spent 30 minutes of critical care time with this patient today.  Family :Family updated at length today. I extensively spoke with the wife and explained to her patient's overall condition. I mentioned that patient will need inpatient psychiatry after discharge but she needs to work on getting medical insurance.   Pollie MeyerJ. Angelo A de Dios, MD 01/22/2016, 4:57 AM [facility] Pager [phone] After 3 pm or if no answer, call [phone]

## 2016-01-22 NOTE — Procedures (Signed)
Extubation Procedure Note  Patient Details:   Name: Nicholas Melton DOB: 11/27/1970 MRN: [id number]   Airway Documentation:  Airway 7.5 mm (Active)  Secured at (cm) 26 cm 01/22/2016  4:19 PM  Measured From Lips 01/22/2016  4:19 PM  Secured Location Center 01/22/2016  4:19 PM  Secured By [employer] Tube Holder 01/22/2016  4:19 PM  Tube Holder Repositioned Yes 01/22/2016  4:19 PM  Cuff Pressure (cm H2O) 26 cm H2O 01/22/2016  8:02 AM  Site Condition Dry 01/22/2016  4:19 PM    Evaluation  O2 sats: stable throughout Complications: No apparent complications Patient did tolerate procedure well. Bilateral Breath Sounds: Clear   Yes.  RT extubated patient without complications. Patient resting comfortably on 4L [place]. Patient able to speak.  Romona CurlsJoseph R Alisha Bacus 01/22/2016, 7:16 PM

## 2016-01-22 NOTE — ED Notes (Signed)
The pts bp is lower  Iv nss opned 150/hr  Prop decreased 2.5

## 2016-01-22 NOTE — ED Notes (Addendum)
7.5 tube intubated by the edp  24 at the lip

## 2016-01-22 NOTE — Care Management Note (Signed)
Case Management Note  Patient Details  Name: Nicholas Melton MRN: [id number] Date of Birth: 02/04/1971  Subjective/Objective:  Pt admitted with drug overdose and alcohol  intoxication                  Action/Plan:  PTA from home with wife.  CSW consulted for current substance abuse and possible intentional overdose - however pt is on ventilator.  Per H&P intentional overdose has not yet been determined.  CM will continue to follow for discharge need   Expected Discharge Date:                  Expected Discharge Plan:     In-House Referral:  Clinical Social Work  Discharge planning Services  CM Consult  Post Acute Care Choice:    Choice offered to:     DME Arranged:    DME Agency:     HH Arranged:    HH Agency:     Status of Service:  In process, will continue to follow  If discussed at Long Length of Stay Meetings, dates discussed:    Additional Comments:  Cherylann ParrClaxton, Chong January S, RN 01/22/2016, 10:12 AM

## 2016-01-22 NOTE — Procedures (Signed)
SBT tried at this time, pt remains too sleepy, will try again later when pt more awake.

## 2016-01-22 NOTE — ED Notes (Signed)
The pts wife reports that he is a heavy drinker and drank heavy all day.  He recently had 3 meds given him from [facility]  She reports that the pt took his keppra and his neurontin normal dosage then turned up the bottle of zyprexa and swallowed them all.  She waited for 30 minutes before calling ems because she did not think the pills would affect him  Tearful at the bedside

## 2016-01-22 NOTE — Progress Notes (Signed)
Initial Nutrition Assessment  DOCUMENTATION CODES:   Non-severe (moderate) malnutrition in context of chronic illness  INTERVENTION:   - Recommend initiating nutrition support if unable to extubate and pt not on PO diet in next 24-48 hours. - Recommend Jevity 1.2 @ 60 mL/hr (1440 mL) to provide 1728 kcal, 80 grams protein, and 1166 mL free water daily. - Recommend Pro-stat 30 mL BID to provide 200 kcal and 30 grams protein. - TF regimen provides 1928 kcal (95% estimated energy needs), 110 grams protein (102% estimated protein needs), and 1166 mL free water. - If pt advanced to PO diet, will supplement as appropriate.  NUTRITION DIAGNOSIS:   Inadequate oral intake related to inability to eat as evidenced by NPO status.  GOAL:   Patient will meet greater than or equal to 90% of their needs  MONITOR:   Vent status, Labs, Weight trends  REASON FOR ASSESSMENT:   Malnutrition Screening Tool, Ventilator   ASSESSMENT:   45 y.o. male with significant PMH of psychiatric disorders, substance abuse and history of previous intentional overdose who presented unresponsive by EMS s/p drug overdose. Wife of patient provided the history. He has been drinking large amounts of alcohol these past few days. Wife and patient had been [facility] on day of admission. Patient apparently took Klonopin, Gabapentin, and entire bottle of Zyprexa in additional to alcohol ingestion approximately one hour prior to arrival in ED. Pt was intubated due to inability to protect his airway. Was given propofol due to lack of synchronization with the vent.  Spoke with family at bedside who report pt eats well and has a UBW of 145-150#. Per pt's wife, pt does not eat well on days when he has been drinking heavily.  Noted MD note suspecting extubation will occur today. Spoke with RN who confirms. Will leave TF recommendations if unable to extubate.  Pt currently intubated on  ventilator support. MV: 13.1 L/min Highest temperature in 24 hours: 37.6 degrees Celsius BP (MAP): 99/65 (76)  Pt with OG tube with tip in the left upper quadrant consistent with location in the upper stomach.  Drips: Precedex @ 10.2 mL/hr, D5 with sodium chloride @ 75 mL/hr (provides 306 kcal daily)  Medications reviewed and include 20 mg Pepcid daily, 1 mg folic acid daily, 5,000 units heparin TID, sliding scale Novolog, MVI with minerals, 100 mg thiamine daily  Labs reviewed and include low sodium (130 mmol/L), low BUN (3 mg/dL), low ionized calcium (0.92 mmol/L) CBG's: 86-101 mg/dL  NFPE: Exam completed. Mild fat depletion, mild muscle depletion, and no edema noted.  Diet Order:  Diet NPO time specified  Skin:  Reviewed, no issues  Last BM:  PTA  Height:   Ht Readings from Last 1 Encounters:  01/22/16 6\' 1"  (1.854 m)    Weight:   Wt Readings from Last 1 Encounters:  01/22/16 149 lb 0.5 oz (67.6 kg)    Ideal Body Weight:  83.6 kg  BMI:  Body mass index is 19.66 kg/m.  Estimated Nutritional Needs:   Kcal:  2033  Protein:  88-101 grams (1.3-1.5 g/kg)  Fluid:  2.0 L/day  EDUCATION NEEDS:   No education needs identified at this time  Rosemarie AxKate Brunetta Newingham Dietetic Intern Pager Number: [phone]

## 2016-01-22 NOTE — Progress Notes (Signed)
Chaplain responded to a request by the Nursing Unit to provide emotional support.for the patient's wife who is alone and very upset. Chaplain presented to her, introduced self and provided ministry of presence for her. She expressed her anxiety about her husband's medical status and shares he was scheduled to go into substance abuse treatment on today.  Chaplain inquired of her support system for herself as she supports her husband. She reports that all her family is out of state, but the patient's mother is a source of support but he didn't want her contacted. Chaplain will continue to follow up as needed. Chaplain Janell QuietAudrey Amos Gaber [phone]

## 2016-01-22 NOTE — ED Notes (Signed)
Narcan 2mg  given iv by morgan rn  No response

## 2016-01-23 ENCOUNTER — Inpatient Hospital Stay (HOSPITAL_COMMUNITY): Payer: Self-pay

## 2016-01-23 DIAGNOSIS — F3181 Bipolar II disorder: Secondary | ICD-10-CM

## 2016-01-23 DIAGNOSIS — G9340 Encephalopathy, unspecified: Secondary | ICD-10-CM

## 2016-01-23 DIAGNOSIS — Z79899 Other long term (current) drug therapy: Secondary | ICD-10-CM

## 2016-01-23 DIAGNOSIS — Z808 Family history of malignant neoplasm of other organs or systems: Secondary | ICD-10-CM

## 2016-01-23 DIAGNOSIS — F17210 Nicotine dependence, cigarettes, uncomplicated: Secondary | ICD-10-CM

## 2016-01-23 DIAGNOSIS — T43594A Poisoning by other antipsychotics and neuroleptics, undetermined, initial encounter: Secondary | ICD-10-CM | POA: Diagnosis not present

## 2016-01-23 DIAGNOSIS — Z888 Allergy status to other drugs, medicaments and biological substances status: Secondary | ICD-10-CM

## 2016-01-23 DIAGNOSIS — F1020 Alcohol dependence, uncomplicated: Secondary | ICD-10-CM

## 2016-01-23 DIAGNOSIS — Z9889 Other specified postprocedural states: Secondary | ICD-10-CM

## 2016-01-23 LAB — CBC WITH DIFFERENTIAL/PLATELET
BASOS PCT: 0 %
Basophils Absolute: 0 10*3/uL (ref 0.0–0.1)
EOS ABS: 0.1 10*3/uL (ref 0.0–0.7)
Eosinophils Relative: 2 %
HEMATOCRIT: 34.5 % — AB (ref 39.0–52.0)
HEMOGLOBIN: 12.6 g/dL — AB (ref 13.0–17.0)
LYMPHS ABS: 1.6 10*3/uL (ref 0.7–4.0)
Lymphocytes Relative: 29 %
MCH: 35.3 pg — AB (ref 26.0–34.0)
MCHC: 36.5 g/dL — ABNORMAL HIGH (ref 30.0–36.0)
MCV: 96.6 fL (ref 78.0–100.0)
Monocytes Absolute: 0.6 10*3/uL (ref 0.1–1.0)
Monocytes Relative: 10 %
NEUTROS ABS: 3.3 10*3/uL (ref 1.7–7.7)
NEUTROS PCT: 59 %
Platelets: 192 10*3/uL (ref 150–400)
RBC: 3.57 MIL/uL — AB (ref 4.22–5.81)
RDW: 13.6 % (ref 11.5–15.5)
WBC: 5.6 10*3/uL (ref 4.0–10.5)

## 2016-01-23 LAB — BASIC METABOLIC PANEL
ANION GAP: 4 — AB (ref 5–15)
BUN: 6 mg/dL (ref 6–20)
CHLORIDE: 106 mmol/L (ref 101–111)
CO2: 25 mmol/L (ref 22–32)
Calcium: 8.3 mg/dL — ABNORMAL LOW (ref 8.9–10.3)
Creatinine, Ser: 0.64 mg/dL (ref 0.61–1.24)
GFR calc Af Amer: >60 mL/min (ref >60)
GLUCOSE: 108 mg/dL — AB (ref 65–99)
POTASSIUM: 3.8 mmol/L (ref 3.5–5.1)
Sodium: 135 mmol/L (ref 135–145)

## 2016-01-23 LAB — HEPATIC FUNCTION PANEL
ALBUMIN: 3.7 g/dL (ref 3.5–5.0)
ALK PHOS: 42 U/L (ref 38–126)
ALT: 19 U/L (ref 17–63)
AST: 25 U/L (ref 15–41)
BILIRUBIN INDIRECT: 1.2 mg/dL — AB (ref 0.3–0.9)
Bilirubin, Direct: 0.2 mg/dL (ref 0.1–0.5)
TOTAL PROTEIN: 5.5 g/dL — AB (ref 6.5–8.1)
Total Bilirubin: 1.4 mg/dL — ABNORMAL HIGH (ref 0.3–1.2)

## 2016-01-23 LAB — GLUCOSE, CAPILLARY
GLUCOSE-CAPILLARY: 103 mg/dL — AB (ref 65–99)
GLUCOSE-CAPILLARY: 114 mg/dL — AB (ref 65–99)
Glucose-Capillary: 114 mg/dL — ABNORMAL HIGH (ref 65–99)

## 2016-01-23 LAB — MAGNESIUM: Magnesium: 2.3 mg/dL (ref 1.7–2.4)

## 2016-01-23 LAB — PHOSPHORUS: Phosphorus: 3.4 mg/dL (ref 2.5–4.6)

## 2016-01-23 MED ORDER — LORAZEPAM 2 MG/ML IJ SOLN: 1.0000 mg | Freq: Four times a day (QID) | INTRAMUSCULAR | Status: DC | PRN

## 2016-01-23 MED ORDER — FOLIC ACID 1 MG PO TABS
1.0000 mg | ORAL_TABLET | Freq: Every day | ORAL | Status: DC
  Administered 2016-01-24: 1 mg via ORAL
  Filled 2016-01-23: qty 1

## 2016-01-23 MED ORDER — LORAZEPAM 1 MG PO TABS: 1.0000 mg | ORAL_TABLET | Freq: Four times a day (QID) | ORAL | Status: DC | PRN

## 2016-01-23 MED ORDER — VITAMIN B-1 100 MG PO TABS
100.0000 mg | ORAL_TABLET | Freq: Every day | ORAL | Status: DC
  Administered 2016-01-24: 100 mg via ORAL
  Filled 2016-01-23: qty 1

## 2016-01-23 NOTE — Progress Notes (Signed)
   01/23/16 1100  Clinical Encounter Type  Visited With Patient and family together  Visit Type Initial;Spiritual support  Referral From Chaplain (Chaplain)  Consult/Referral To Chaplain  Spiritual Encounters  Spiritual Needs Emotional  Stress Factors  Patient Stress Factors Health changes  Family Stress Factors Exhausted  Pt. Is going to be moved to another, regular room, mentioned that today he feels a little better, fiance is present, mentioned that she is tired but relieved that patient is awake and alert, looks better, is hopeful. Chaplain render ministry of presence, emotion support, pastoral care, no prayer, to contact Pastoral Care/Spiritual Wellness if need further assistance.  CHS IncChaplain Jaleiah Asay [phone]

## 2016-01-23 NOTE — Consult Note (Signed)
[facility] Consult   Reason for Consult:  Intentional drug overdose and alcohol intoxication Referring Physician:  Dr. Corrie Dandy Patient Identification: Nicholas Melton MRN:  [id number] Principal Diagnosis: Overdose Diagnosis:   Patient Active Problem List   Diagnosis Date Noted  . Overdose [T50.901A] 01/22/2016  . Malnutrition of moderate degree [E44.0] 01/22/2016  . Intentional drug overdose ([place]) [T50.902A]   . Acute hypoxemic respiratory failure ([place]) [J96.01]   . Alcohol intoxication with delirium ([place]) [F10.921]   . Alcohol use disorder, severe, dependence ([place]) [F10.20] 02/14/2015  . Bipolar 2 disorder, major depressive episode ([place]) [F31.81] 02/14/2015  . Alcohol dependence ([place]) [F10.20] 06/17/2011  . MDD (major depressive disorder) [F32.9] 06/15/2011  . Alcohol consumption of more than four drinks per day [Z78.9] 06/15/2011  . PTSD (post-traumatic stress disorder) [F43.10] 06/15/2011    Class: Acute  . Homeless [Z59.0] 06/15/2011    Total Time spent with patient: 45 minutes  Subjective:   Nicholas Melton is a 45 y.o. male patient admitted with intentional drug overdose and substance abuse.  HPI:  Nicholas Melton 45 y.o.male, Seen, chart reviewed and case discussed with the patient and his wife was at bedside for the face-to-face psychiatric consultation and evaluation of intentional drug overdose while intoxicated with alcohol. Patient appeared sitting in a chair next to the bed. He is awake, alert, oriented to time place and person and cooperative with this evaluation. Patient reportedly known for alcohol dependence, works in [employer] and relapsed on drinking reportedly drank 12 packs of beer in 2 days time and then got confused while taking his medication. Patient and patient wife reported he has no suicidal/homicidal ideation, intention or plans. Patient stated that he would like to get substance abuse counseling services as outpatient but hesitant  about participating residential substance abuse treatment program because he may lose the job and becoming homeless. Patient also has a history of intentional overdose of Benadryl in the past. Patient has previously received alcohol detox treatment and currently receiving outpatient medication management from [facility]. Patient blood alcohol level on admission is 261. Reportedly patient was intubated in the emergency department. Patient is ready to be transferred from the medical intensive care unit at this time. Patient contract for safety and willing to go to the outpatient substance abuse counseling services and psychiatric medication management when medically stable.   Past Psychiatric History: Alcohol detox treatment at [facility] in December 2016 and out patient mental health care at [facility].   Risk to Self: Is patient at risk for suicide?: Yes Risk to Others:   Prior Inpatient Therapy:   Prior Outpatient Therapy:    Past Medical History:  Past Medical History:  Diagnosis Date  . Anxiety   . Bipolar 2 disorder ([place])   . Depression   . Hypertension   . Night terrors, adult   . OCD (obsessive compulsive disorder)   . Panic attacks   . Posttraumatic stress disorder   . Social phobia     Past Surgical History:  Procedure Laterality Date  . THYROIDECTOMY    . VASECTOMY     Family History:  Family History  Problem Relation Age of Onset  . Cancer Brother    Family Psychiatric  History: Noncontributory  Social History:  History  Alcohol Use  . 67.2 oz/week  . 112 Cans of beer per week     History  Drug Use  . Types: Cocaine    Comment: reports uses appr. once a year    Social  History   Social History  . Marital status: Single    Spouse name: N/A  . Number of children: N/A  . Years of education: N/A   Social History Main Topics  . Smoking status: Current Every Day Smoker    Packs/day: 2.00    Years: 15.00    Types: Cigarettes  . Smokeless tobacco: Former Systems developer  . Alcohol  use 67.2 oz/week    112 Cans of beer per week  . Drug use:     Types: Cocaine     Comment: reports uses appr. once a year  . Sexual activity: Yes   Other Topics Concern  . None   Social History Narrative  . None   Additional Social History:    Allergies:   Allergies  Allergen Reactions  . Gabapentin Nausea And Vomiting and Other (See Comments)    lethargic  . Risperidone And Related Other (See Comments)    Faint, dizzy, lethargic   . Seroquel [Quetiapine] Other (See Comments)    Faint, dizzy, lethargic   . Depakote Er [Divalproex Sodium Er] Diarrhea  . Ambien [Zolpidem Tartrate] Other (See Comments)    Hallucinations   . Lamictal [Lamotrigine] Nausea And Vomiting    Labs:  Results for orders placed or performed during the hospital encounter of 01/22/16 (from the past 48 hour(s))  CBC with Differential/Platelet     Status: Abnormal   Collection Time: 01/22/16  2:07 AM  Result Value Ref Range   WBC 5.3 4.0 - 10.5 K/uL   RBC 4.07 (L) 4.22 - 5.81 MIL/uL   Hemoglobin 13.9 13.0 - 17.0 g/dL   HCT 39.1 39.0 - 52.0 %   MCV 96.1 78.0 - 100.0 fL   MCH 34.2 (H) 26.0 - 34.0 pg   MCHC 35.5 30.0 - 36.0 g/dL   RDW 13.1 11.5 - 15.5 %   Platelets 238 150 - 400 K/uL   Neutrophils Relative % 51 %   Neutro Abs 2.7 1.7 - 7.7 K/uL   Lymphocytes Relative 40 %   Lymphs Abs 2.1 0.7 - 4.0 K/uL   Monocytes Relative 5 %   Monocytes Absolute 0.2 0.1 - 1.0 K/uL   Eosinophils Relative 3 %   Eosinophils Absolute 0.2 0.0 - 0.7 K/uL   Basophils Relative 1 %   Basophils Absolute 0.1 0.0 - 0.1 K/uL  Comprehensive metabolic panel     Status: Abnormal   Collection Time: 01/22/16  2:07 AM  Result Value Ref Range   Sodium 129 (L) 135 - 145 mmol/L   Potassium 4.5 3.5 - 5.1 mmol/L   Chloride 94 (L) 101 - 111 mmol/L   CO2 22 22 - 32 mmol/L   Glucose, Bld 119 (H) 65 - 99 mg/dL   BUN <5 (L) 6 - 20 mg/dL   Creatinine, Ser 0.57 (L) 0.61 - 1.24 mg/dL   Calcium 8.7 (L) 8.9 - 10.3 mg/dL   Total  Protein 7.2 6.5 - 8.1 g/dL   Albumin 4.8 3.5 - 5.0 g/dL   AST 49 (H) 15 - 41 U/L   ALT 27 17 - 63 U/L   Alkaline Phosphatase 43 38 - 126 U/L   Total Bilirubin 1.1 0.3 - 1.2 mg/dL   GFR calc non Af Amer >60 >60 mL/min   GFR calc Af Amer >60 >60 mL/min    Comment: (NOTE) The eGFR has been calculated using the CKD EPI equation. This calculation has not been validated in all clinical situations. eGFR's persistently <60 mL/min signify possible  Chronic Kidney Disease.    Anion gap 13 5 - 15  Acetaminophen level     Status: Abnormal   Collection Time: 01/22/16  2:07 AM  Result Value Ref Range   Acetaminophen (Tylenol), Serum <10 (L) 10 - 30 ug/mL    Comment:        THERAPEUTIC CONCENTRATIONS VARY SIGNIFICANTLY. A RANGE OF 10-30 ug/mL MAY BE AN EFFECTIVE CONCENTRATION FOR MANY PATIENTS. HOWEVER, SOME ARE BEST TREATED AT CONCENTRATIONS OUTSIDE THIS RANGE. ACETAMINOPHEN CONCENTRATIONS >150 ug/mL AT 4 HOURS AFTER INGESTION AND >50 ug/mL AT 12 HOURS AFTER INGESTION ARE OFTEN ASSOCIATED WITH TOXIC REACTIONS.   Salicylate level     Status: None   Collection Time: 01/22/16  2:07 AM  Result Value Ref Range   Salicylate Lvl <3.3 2.8 - 30.0 mg/dL  Ethanol     Status: Abnormal   Collection Time: 01/22/16  2:07 AM  Result Value Ref Range   Alcohol, Ethyl (B) 261 (H) <5 mg/dL    Comment:        LOWEST DETECTABLE LIMIT FOR SERUM ALCOHOL IS 5 mg/dL FOR MEDICAL PURPOSES ONLY   Troponin I     Status: None   Collection Time: 01/22/16  2:07 AM  Result Value Ref Range   Troponin I <0.03 <0.03 ng/mL  Protime-INR     Status: None   Collection Time: 01/22/16  2:07 AM  Result Value Ref Range   Prothrombin Time 12.6 11.4 - 15.2 seconds   INR 0.94   Triglycerides     Status: None   Collection Time: 01/22/16  2:09 AM  Result Value Ref Range   Triglycerides 33 <150 mg/dL  I-stat troponin, ED     Status: None   Collection Time: 01/22/16  2:15 AM  Result Value Ref Range   Troponin i, poc  0.01 0.00 - 0.08 ng/mL   Comment 3            Comment: Due to the release kinetics of cTnI, a negative result within the first hours of the onset of symptoms does not rule out myocardial infarction with certainty. If myocardial infarction is still suspected, repeat the test at appropriate intervals.   I-Stat Chem 8, ED     Status: Abnormal   Collection Time: 01/22/16  2:17 AM  Result Value Ref Range   Sodium 130 (L) 135 - 145 mmol/L   Potassium 4.7 3.5 - 5.1 mmol/L   Chloride 95 (L) 101 - 111 mmol/L   BUN 3 (L) 6 - 20 mg/dL   Creatinine, Ser 0.70 0.61 - 1.24 mg/dL   Glucose, Bld 110 (H) 65 - 99 mg/dL   Calcium, Ion 0.92 (L) 1.15 - 1.40 mmol/L   TCO2 25 0 - 100 mmol/L   Hemoglobin 14.6 13.0 - 17.0 g/dL   HCT 43.0 39.0 - 52.0 %  I-Stat CG4 Lactic Acid, ED     Status: Abnormal   Collection Time: 01/22/16  2:18 AM  Result Value Ref Range   Lactic Acid, Venous 2.33 (HH) 0.5 - 1.9 mmol/L   Comment NOTIFIED PHYSICIAN   I-Stat Arterial Blood Gas, ED - (order at [address] [facility] and [facility] only)     Status: Abnormal   Collection Time: 01/22/16  3:12 AM  Result Value Ref Range   pH, Arterial 7.348 (L) 7.350 - 7.450   pCO2 arterial 51.2 (H) 32.0 - 48.0 mmHg   pO2, Arterial 511.0 (H) 83.0 - 108.0 mmHg   Bicarbonate 28.1 (H) 20.0 - 28.0 mmol/L  TCO2 30 0 - 100 mmol/L   O2 Saturation 100.0 %   Acid-Base Excess 1.0 0.0 - 2.0 mmol/L   Patient temperature HIDE    Collection site RADIAL, ALLEN'S TEST ACCEPTABLE    Drawn by RT    Sample type ARTERIAL   Rapid urine drug screen (hospital performed)     Status: None   Collection Time: 01/22/16  3:20 AM  Result Value Ref Range   Opiates NONE DETECTED NONE DETECTED   Cocaine NONE DETECTED NONE DETECTED   Benzodiazepines NONE DETECTED NONE DETECTED   Amphetamines NONE DETECTED NONE DETECTED   Tetrahydrocannabinol NONE DETECTED NONE DETECTED   Barbiturates NONE DETECTED NONE DETECTED    Comment:        DRUG SCREEN FOR MEDICAL PURPOSES ONLY.  IF  CONFIRMATION IS NEEDED FOR ANY PURPOSE, NOTIFY LAB WITHIN 5 DAYS.        LOWEST DETECTABLE LIMITS FOR URINE DRUG SCREEN Drug Class       Cutoff (ng/mL) Amphetamine      1000 Barbiturate      200 Benzodiazepine   250 Tricyclics       037 Opiates          300 Cocaine          300 THC              50   Urinalysis, Routine w reflex microscopic (not at [facility])     Status: Abnormal   Collection Time: 01/22/16  3:20 AM  Result Value Ref Range   Color, Urine YELLOW YELLOW   APPearance CLEAR CLEAR   Specific Gravity, Urine 1.004 (L) 1.005 - 1.030   pH 7.0 5.0 - 8.0   Glucose, UA NEGATIVE NEGATIVE mg/dL   Hgb urine dipstick NEGATIVE NEGATIVE   Bilirubin Urine NEGATIVE NEGATIVE   Ketones, ur NEGATIVE NEGATIVE mg/dL   Protein, ur NEGATIVE NEGATIVE mg/dL   Nitrite NEGATIVE NEGATIVE   Leukocytes, UA NEGATIVE NEGATIVE    Comment: MICROSCOPIC NOT DONE ON URINES WITH NEGATIVE PROTEIN, BLOOD, LEUKOCYTES, NITRITE, OR GLUCOSE <1000 mg/dL.  CBC     Status: Abnormal   Collection Time: 01/22/16  4:15 AM  Result Value Ref Range   WBC 5.1 4.0 - 10.5 K/uL   RBC 4.08 (L) 4.22 - 5.81 MIL/uL   Hemoglobin 13.9 13.0 - 17.0 g/dL   HCT 39.3 39.0 - 52.0 %   MCV 96.3 78.0 - 100.0 fL   MCH 34.1 (H) 26.0 - 34.0 pg   MCHC 35.4 30.0 - 36.0 g/dL   RDW 13.2 11.5 - 15.5 %   Platelets 218 150 - 400 K/uL  Creatinine, serum     Status: Abnormal   Collection Time: 01/22/16  4:15 AM  Result Value Ref Range   Creatinine, Ser 0.55 (L) 0.61 - 1.24 mg/dL   GFR calc non Af Amer >60 >60 mL/min   GFR calc Af Amer >60 >60 mL/min    Comment: (NOTE) The eGFR has been calculated using the CKD EPI equation. This calculation has not been validated in all clinical situations. eGFR's persistently <60 mL/min signify possible Chronic Kidney Disease.   MRSA PCR Screening     Status: None   Collection Time: 01/22/16  5:52 AM  Result Value Ref Range   MRSA by PCR NEGATIVE NEGATIVE    Comment:        The GeneXpert MRSA Assay  (FDA approved for NASAL specimens only), is one component of a comprehensive MRSA colonization surveillance program. It is  not intended to diagnose MRSA infection nor to guide or monitor treatment for MRSA infections.   Glucose, capillary     Status: None   Collection Time: 01/22/16  6:00 AM  Result Value Ref Range   Glucose-Capillary 86 65 - 99 mg/dL   Comment 1 Notify RN    Comment 2 Document in Chart   CK total and CKMB (cardiac)not at [facility]     Status: None   Collection Time: 01/22/16  6:51 AM  Result Value Ref Range   Total CK 173 49 - 397 U/L   CK, MB 3.0 0.5 - 5.0 ng/mL   Relative Index 1.7 0.0 - 2.5  Amylase     Status: None   Collection Time: 01/22/16  6:51 AM  Result Value Ref Range   Amylase 47 28 - 100 U/L  Lipase, blood     Status: None   Collection Time: 01/22/16  6:51 AM  Result Value Ref Range   Lipase 27 11 - 51 U/L  Troponin I     Status: None   Collection Time: 01/22/16  6:51 AM  Result Value Ref Range   Troponin I <0.03 <0.03 ng/mL  Glucose, capillary     Status: Abnormal   Collection Time: 01/22/16  7:51 AM  Result Value Ref Range   Glucose-Capillary 101 (H) 65 - 99 mg/dL   Comment 1 Notify RN    Comment 2 Document in Chart   I-STAT 3, arterial blood gas (G3+)     Status: Abnormal   Collection Time: 01/22/16  7:58 AM  Result Value Ref Range   pH, Arterial 7.477 (H) 7.350 - 7.450   pCO2 arterial 29.2 (L) 32.0 - 48.0 mmHg   pO2, Arterial 64.0 (L) 83.0 - 108.0 mmHg   Bicarbonate 21.6 20.0 - 28.0 mmol/L   TCO2 22 0 - 100 mmol/L   O2 Saturation 94.0 %   Acid-base deficit 1.0 0.0 - 2.0 mmol/L   Patient temperature 98.6 F    Collection site RADIAL, ALLEN'S TEST ACCEPTABLE    Drawn by Operator    Sample type ARTERIAL   Glucose, capillary     Status: None   Collection Time: 01/22/16 11:30 AM  Result Value Ref Range   Glucose-Capillary 85 65 - 99 mg/dL   Comment 1 Notify RN    Comment 2 Document in Chart   Basic metabolic panel     Status:  Abnormal   Collection Time: 01/22/16 11:47 AM  Result Value Ref Range   Sodium 132 (L) 135 - 145 mmol/L   Potassium 3.5 3.5 - 5.1 mmol/L   Chloride 100 (L) 101 - 111 mmol/L   CO2 19 (L) 22 - 32 mmol/L   Glucose, Bld 87 65 - 99 mg/dL   BUN 6 6 - 20 mg/dL   Creatinine, Ser 0.68 0.61 - 1.24 mg/dL   Calcium 8.2 (L) 8.9 - 10.3 mg/dL   GFR calc non Af Amer >60 >60 mL/min   GFR calc Af Amer >60 >60 mL/min    Comment: (NOTE) The eGFR has been calculated using the CKD EPI equation. This calculation has not been validated in all clinical situations. eGFR's persistently <60 mL/min signify possible Chronic Kidney Disease.    Anion gap 13 5 - 15  Magnesium     Status: None   Collection Time: 01/22/16 11:47 AM  Result Value Ref Range   Magnesium 1.9 1.7 - 2.4 mg/dL  Phosphorus     Status: None   Collection  Time: 01/22/16 11:47 AM  Result Value Ref Range   Phosphorus 2.7 2.5 - 4.6 mg/dL  Troponin I     Status: None   Collection Time: 01/22/16 11:47 AM  Result Value Ref Range   Troponin I <0.03 <0.03 ng/mL  Glucose, capillary     Status: None   Collection Time: 01/22/16  3:25 PM  Result Value Ref Range   Glucose-Capillary 97 65 - 99 mg/dL   Comment 1 Document in Chart   Troponin I     Status: None   Collection Time: 01/22/16  5:03 PM  Result Value Ref Range   Troponin I <0.03 <0.03 ng/mL  Glucose, capillary     Status: Abnormal   Collection Time: 01/22/16  7:35 PM  Result Value Ref Range   Glucose-Capillary 117 (H) 65 - 99 mg/dL   Comment 1 Notify RN    Comment 2 Document in Chart   Glucose, capillary     Status: Abnormal   Collection Time: 01/22/16 11:30 PM  Result Value Ref Range   Glucose-Capillary 109 (H) 65 - 99 mg/dL   Comment 1 Notify RN    Comment 2 Document in Chart   Hepatic function panel     Status: Abnormal   Collection Time: 01/23/16  2:42 AM  Result Value Ref Range   Total Protein 5.5 (L) 6.5 - 8.1 g/dL   Albumin 3.7 3.5 - 5.0 g/dL   AST 25 15 - 41 U/L   ALT  19 17 - 63 U/L   Alkaline Phosphatase 42 38 - 126 U/L   Total Bilirubin 1.4 (H) 0.3 - 1.2 mg/dL   Bilirubin, Direct 0.2 0.1 - 0.5 mg/dL   Indirect Bilirubin 1.2 (H) 0.3 - 0.9 mg/dL  Basic metabolic panel     Status: Abnormal   Collection Time: 01/23/16  2:42 AM  Result Value Ref Range   Sodium 135 135 - 145 mmol/L   Potassium 3.8 3.5 - 5.1 mmol/L   Chloride 106 101 - 111 mmol/L   CO2 25 22 - 32 mmol/L   Glucose, Bld 108 (H) 65 - 99 mg/dL   BUN 6 6 - 20 mg/dL   Creatinine, Ser 0.64 0.61 - 1.24 mg/dL   Calcium 8.3 (L) 8.9 - 10.3 mg/dL   GFR calc non Af Amer >60 >60 mL/min   GFR calc Af Amer >60 >60 mL/min    Comment: (NOTE) The eGFR has been calculated using the CKD EPI equation. This calculation has not been validated in all clinical situations. eGFR's persistently <60 mL/min signify possible Chronic Kidney Disease.    Anion gap 4 (L) 5 - 15  Magnesium     Status: None   Collection Time: 01/23/16  2:42 AM  Result Value Ref Range   Magnesium 2.3 1.7 - 2.4 mg/dL  Phosphorus     Status: None   Collection Time: 01/23/16  2:42 AM  Result Value Ref Range   Phosphorus 3.4 2.5 - 4.6 mg/dL  CBC with Differential/Platelet     Status: Abnormal   Collection Time: 01/23/16  2:42 AM  Result Value Ref Range   WBC 5.6 4.0 - 10.5 K/uL   RBC 3.57 (L) 4.22 - 5.81 MIL/uL   Hemoglobin 12.6 (L) 13.0 - 17.0 g/dL   HCT 34.5 (L) 39.0 - 52.0 %   MCV 96.6 78.0 - 100.0 fL   MCH 35.3 (H) 26.0 - 34.0 pg   MCHC 36.5 (H) 30.0 - 36.0 g/dL   RDW 13.6 11.5 -  15.5 %   Platelets 192 150 - 400 K/uL   Neutrophils Relative % 59 %   Neutro Abs 3.3 1.7 - 7.7 K/uL   Lymphocytes Relative 29 %   Lymphs Abs 1.6 0.7 - 4.0 K/uL   Monocytes Relative 10 %   Monocytes Absolute 0.6 0.1 - 1.0 K/uL   Eosinophils Relative 2 %   Eosinophils Absolute 0.1 0.0 - 0.7 K/uL   Basophils Relative 0 %   Basophils Absolute 0.0 0.0 - 0.1 K/uL  Glucose, capillary     Status: Abnormal   Collection Time: 01/23/16  4:22 AM  Result  Value Ref Range   Glucose-Capillary 103 (H) 65 - 99 mg/dL   Comment 1 Notify RN    Comment 2 Document in Chart   Glucose, capillary     Status: Abnormal   Collection Time: 01/23/16  8:17 AM  Result Value Ref Range   Glucose-Capillary 114 (H) 65 - 99 mg/dL   Comment 1 Notify RN    Comment 2 Document in Chart   Glucose, capillary     Status: Abnormal   Collection Time: 01/23/16 11:30 AM  Result Value Ref Range   Glucose-Capillary 114 (H) 65 - 99 mg/dL   Comment 1 Notify RN    Comment 2 Document in Chart     Current Facility-Administered Medications  Medication Dose Route Frequency Provider Last Rate Last Dose  . 0.9 %  sodium chloride infusion  250 mL Intravenous PRN Corey Harold, NP      . albuterol (PROVENTIL) (2.5 MG/3ML) 0.083% nebulizer solution 2.5 mg  2.5 mg Nebulization Q4H PRN Juanito Doom, MD      . Derrill Memo ON 68/34/1961] folic acid (FOLVITE) tablet 1 mg  1 mg Oral Daily Raylene Miyamoto, MD      . heparin injection 5,000 Units  5,000 Units Subcutaneous Q8H Corey Harold, NP   5,000 Units at 01/23/16 0536  . LORazepam (ATIVAN) tablet 1 mg  1 mg Oral Q6H PRN Nicolette Bang, DO       Or  . LORazepam (ATIVAN) injection 1 mg  1 mg Intravenous Q6H PRN Nicolette Bang, DO      . multivitamin with minerals tablet 1 tablet  1 tablet Oral Daily Juanito Doom, MD   1 tablet at 01/23/16 0920  . [START ON 01/24/2016] thiamine (VITAMIN B-1) tablet 100 mg  100 mg Oral Daily Raylene Miyamoto, MD        Musculoskeletal: Strength & Muscle Tone: decreased Gait & Station: unable to stand Patient leans: N/A  Psychiatric Specialty Exam: Physical Exam as per physical examination   ROS patient appeared with decreased psychomotor activity,  and slow verbal responses.  No Fever-chills, No Headache, No changes with Vision or hearing, reports vertigo No problems swallowing food or Liquids, No Chest pain, Cough or Shortness of Breath, No Abdominal pain, No  Nausea or Vommitting, Bowel movements are regular, No Blood in stool or Urine, No dysuria, No new skin rashes or bruises, No new joints pains-aches,  No new weakness, tingling, numbness in any extremity, No recent weight gain or loss, No polyuria, polydypsia or polyphagia,   A full 10 point Review of Systems was done, except as stated above, all other Review of Systems were negative.  Blood pressure 117/68, pulse 67, temperature 98.8 F (37.1 C), temperature source Oral, resp. rate 18, height '6\' 1"'  (1.854 m), weight 69.4 kg (153 lb), SpO2 98 %.Body mass index is 20.19  kg/m.  General Appearance: Disheveled and Guarded  Eye Contact:  Good  Speech:  Clear and Coherent and Slow  Volume:  Decreased  Mood:  Depressed  Affect:  Constricted and Depressed  Thought Process:  Coherent and Goal Directed  Orientation:  Full (Time, Place, and Person)  Thought Content:  WDL  Suicidal Thoughts:  No  Homicidal Thoughts:  No  Memory:  Immediate;   Good Recent;   Fair Remote;   Fair  Judgement:  Impaired  Insight:  Fair  Psychomotor Activity:  Psychomotor Retardation  Concentration:  Concentration: Fair and Attention Span: Poor  Recall:  Poor  Fund of Knowledge:  Fair  Language:  Good  Akathisia:  Negative  Handed:  Right  AIMS (if indicated):     Assets:  Communication Skills Desire for Improvement Housing Intimacy Resilience Social Support Transportation  ADL's:  Impaired  Cognition:  Impaired,  Mild  Sleep:        Treatment Plan Summary: 46 years old male with a diagnosis of alcohol dependence, substance-induced mood disorder admitted with altered mental status secondary to unintentional drug overdose Zyprexa while intoxicated with alcohol. Patient has been sober during this evaluation but continued to have psychomotor retardation slow verbal response and poor responses regarding his treatment needs.   Safety concerns: Manufacturing engineer, Patient denies active  suicidal/homicidal ideation  Monitor for alcohol withdrawal symptoms and delirium tremens  Continue CIWA protocol Recent benefit from residential substance abuse treatment program or chemical dependency intensive outpatient program  Referred to the unit social service for appropriate treatment needs and providing referrals   Daily contact with patient to assess and evaluate symptoms and progress in treatment and Medication management   Appreciate psychiatric consultation and we sign off as of today Please contact [phone] or [phone] if needs further assistance   Disposition: Recommend psychiatric Inpatient admission when medically cleared. Patient does not meet criteria for psychiatric inpatient admission. Supportive therapy provided about ongoing stressors.  Ambrose Finland, MD 01/23/2016 12:26 PM

## 2016-01-23 NOTE — Progress Notes (Signed)
Pt to be transferred to [facility] per MD order via wheelchair. Report called to RN. Pts wife at bedside and ware of transfer.

## 2016-01-23 NOTE — Progress Notes (Signed)
   01/23/16 1000  Clinical Encounter Type  Visited With Patient and family together  Visit Type Other (Comment) ([facility] consult)  Referral From Nurse  Consult/Referral To Chaplain  Spiritual Encounters  Spiritual Needs Prayer  Stress Factors  Patient Stress Factors Health changes  Family Stress Factors Family relationships  Gave introduction to Pt and family. Offered up prayer of comfort and support.

## 2016-01-23 NOTE — Progress Notes (Signed)
PULMONARY / CRITICAL CARE MEDICINE   Name: Nicholas Melton MRN: [id number] DOB: 11/04/1970    ADMISSION DATE:  01/22/2016  CHIEF COMPLAINT:  Intentional Overdose   Brief history:   Nicholas Melton is 45 y.o. male with significant PMH of psychiatric disorders, substance abuse and history of previous intentional overdose with Benadryl who presented unresponsive by EMS s/p drug overdose. Wife of patient was at bedside and provided the history. [facility] and was recently started on Zyprexa. He has been drinking large amounts of alcohol these past few days. Wife estimates he drank three 12 packs of beer since morning of 11/7. Patient apparently took Klonopin, Gabapentin, and entire bottle of Zyprexa in additional to alcohol ingestion approximately one hour prior to arrival in ED. In route he was given Narcan 2 mg without improvement. In the ED, he was intubated due to inability to protect his airway. Was given propofol due to lack of synchronization with the vent. Labs were significant for ethanol level of 261.   SUBJECTIVE:  Awake, alert, resting in bed. No acute events over night. Denies chest pain and headache. Feels hungry and would like to eat.  VITAL SIGNS: BP 110/68 (BP Location: Right Arm)   Pulse (!) 59   Temp 98.4 F (36.9 C) (Core (Comment))   Resp 12   Ht 6\' 1"  (1.854 m)   Wt 69.4 kg (153 lb)   SpO2 97%   BMI 20.19 kg/m   HEMODYNAMICS:    VENTILATOR SETTINGS: Vent Mode: PSV;CPAP FiO2 (%):  [40 %] 40 % Set Rate:  [20 bmp] 20 bmp Vt Set:  [640 mL] 640 mL PEEP:  [5 cmH20] 5 cmH20 Pressure Support:  [5 cmH20] 5 cmH20 Plateau Pressure:  [14 cmH20] 14 cmH20  INTAKE / OUTPUT: I/O last 3 completed shifts: In: 4986.6 [I.V.:3936.6; Other:1000; IV Piggyback:50] Out: 4345 [Urine:3945; Emesis/NG output:400]  PHYSICAL EXAMINATION: General:  Comfortable, NAD Neuro:  Awake, alert, CN II- VII intact grossly, no signs of withdrawal  HEENT:  [place],  [place]/AT Cardiovascular:  RRR no mrg, no peripheral edema Lungs:  CTAB, symmetric chest wall motion Abdomen:  Soft, nontender, nondistended, bowel sounds present Musculoskeletal:  Moves all  extremities Skin:  Warm, dry, intact  LABS:  BMET  Recent Labs Lab 01/22/16 0207 01/22/16 0217 01/22/16 0415 01/22/16 1147 01/23/16 0242  NA 129* 130*  --  132* 135  K 4.5 4.7  --  3.5 3.8  CL 94* 95*  --  100* 106  CO2 22  --   --  19* 25  BUN <5* 3*  --  6 6  CREATININE 0.57* 0.70 0.55* 0.68 0.64  GLUCOSE 119* 110*  --  87 108*    Electrolytes  Recent Labs Lab 01/22/16 0207 01/22/16 1147 01/23/16 0242  CALCIUM 8.7* 8.2* 8.3*  MG  --  1.9 2.3  PHOS  --  2.7 3.4    CBC  Recent Labs Lab 01/22/16 0207 01/22/16 0217 01/22/16 0415 01/23/16 0242  WBC 5.3  --  5.1 5.6  HGB 13.9 14.6 13.9 12.6*  HCT 39.1 43.0 39.3 34.5*  PLT 238  --  218 192    Coag's  Recent Labs Lab 01/22/16 0207  INR 0.94    Sepsis Markers  Recent Labs Lab 01/22/16 0218  LATICACIDVEN 2.33*    ABG  Recent Labs Lab 01/22/16 0312 01/22/16 0758  PHART 7.348* 7.477*  PCO2ART 51.2* 29.2*  PO2ART 511.0* 64.0*    Liver Enzymes  Recent Labs Lab  01/22/16 0207 01/23/16 0242  AST 49* 25  ALT 27 19  ALKPHOS 43 42  BILITOT 1.1 1.4*  ALBUMIN 4.8 3.7    Cardiac Enzymes  Recent Labs Lab 01/22/16 0651 01/22/16 1147 01/22/16 1703  TROPONINI <0.03 <0.03 <0.03    Glucose  Recent Labs Lab 01/22/16 0751 01/22/16 1130 01/22/16 1525 01/22/16 1935 01/22/16 2330 01/23/16 0422  GLUCAP 101* 85 97 117* 109* 103*    Imaging Dg Chest Port 1 View  Result Date: 01/23/2016 CLINICAL DATA:  Acute respiratory failure EXAM: PORTABLE CHEST 1 VIEW COMPARISON:  Yesterday FINDINGS: Tracheal and esophageal extubation with good lung volumes. There is no edema, consolidation, effusion, or pneumothorax. Normal heart size and mediastinal contours. IMPRESSION: Extubation with improved inflation.  Electronically Signed   By: Marnee Spring M.D.   On: 01/23/2016 07:31     STUDIES:  CXR 11/9: No active pulmonary process.  CT Head 11/9 Normal appearance of the brain, mild mucosal inflammation of the paranasal sinuses.  CULTURES: None   ANTIBIOTICS: None   SIGNIFICANT EVENTS: 11/9: Admitted to ICU. Intubated in ED secondary to unresponsiveness after intentional overdose.  11/10 extubated  LINES/TUBES: ETT 11/9 >> 11/10 PIV x2  Foley catheter 11/9 >> 11/10  DISCUSSION: Nicholas Melton is 45 y.o. male with PMH of bipolar disorder, schizoaffective disorder, PTSD, anxiety/depression, current EtOH abuse, history of illicit substance abuse and past h/o intentional overdose who presents s/p intentional overdose with alcohol, gabapentin, zyprexa, and klonopin. Patient required intubation in the ED due to inability to protect airway.   ASSESSMENT / PLAN:  PULMONARY A: Inability to protect airway 2/2 to encephalopathy s/p intentional ingestion.  Extubated 11/9 P:   IS at tolerated Up to chair PT consult  CARDIOVASCULAR A:  Hypertension, resolved  Negative troponin  P:  QTC 448   RENAL A:   Hyponatremia, likely secondary to beer potomania, improving P:   AM BMET  Replete electrolytes prn  Foley removed today, good UOP  GASTROINTESTINAL A:   Mildly elevated AST  GI ppx P:   Regular diet ordered IV Pepcid  Monitor LFTs   HEMATOLOGIC A:   DVT Prophylaxis  P:  AM CBC  Heparin SQ   INFECTIOUS A:   No acute process  P:   Monitor   ENDOCRINE A:   No acute process  P:   Monitor  SSI  NEUROLOGIC A:   Acute encephalopathy secondary to intentional overdose  History of EtOH withdrawal  P:   CT head shows normal appearance of the brain. Monitor for alcohol withdrawal/DTs, Ativan PRN, off precedex UDS negative Continue Thiamine/multivitamin  Awaiting inpt psych consult   FAMILY  - Updates: Family updated at bedside  -  Inter-disciplinary family meet or Palliative Care meeting due by: Jan 27 2016.   Ephriam Jenkins, MS4 Attending Dr. Tyson Alias  Pulmonary and Critical Care Medicine [facility] Pager: [phone]  01/23/2016, 8:15 AM  STAFF NOTE: I, Rory Percy, MD FACP have personally reviewed patient's available data, including medical history, events of note, physical examination and test results as part of my evaluation. I have discussed with resident/NP and other care providers such as pharmacist, RN and RRT. In addition, I personally evaluated patient and elicited key findings UJ:WJXBJ, nonfocal, awake, lungs clear, abdo soft, QTC wnl, eating, dc foley, IS, if qtc remains wnl today, dc tele to med floor, get psych evluation, keep sitter and precautions in place, no labs needed, I updated pt and mom in room, wife also, maintain thiamine,  folic, mv oral maintain To traid  Mcarthur Rossettianiel J. Tyson AliasFeinstein, MD, FACP Pgr: [phone] [facility] 01/23/2016 11:30 AM

## 2016-01-24 LAB — CBC
HEMATOCRIT: 36.1 % — AB (ref 39.0–52.0)
Hemoglobin: 12.3 g/dL — ABNORMAL LOW (ref 13.0–17.0)
MCH: 33.5 pg (ref 26.0–34.0)
MCHC: 34.1 g/dL (ref 30.0–36.0)
MCV: 98.4 fL (ref 78.0–100.0)
PLATELETS: 210 10*3/uL (ref 150–400)
RBC: 3.67 MIL/uL — ABNORMAL LOW (ref 4.22–5.81)
RDW: 13.8 % (ref 11.5–15.5)
WBC: 5.9 10*3/uL (ref 4.0–10.5)

## 2016-01-24 LAB — BASIC METABOLIC PANEL
ANION GAP: 8 (ref 5–15)
BUN: 6 mg/dL (ref 6–20)
CALCIUM: 8.5 mg/dL — AB (ref 8.9–10.3)
CO2: 25 mmol/L (ref 22–32)
CREATININE: 0.66 mg/dL (ref 0.61–1.24)
Chloride: 106 mmol/L (ref 101–111)
Glucose, Bld: 90 mg/dL (ref 65–99)
Potassium: 3.7 mmol/L (ref 3.5–5.1)
SODIUM: 139 mmol/L (ref 135–145)

## 2016-01-24 LAB — MAGNESIUM: MAGNESIUM: 2.2 mg/dL (ref 1.7–2.4)

## 2016-01-24 LAB — PHOSPHORUS: PHOSPHORUS: 3.5 mg/dL (ref 2.5–4.6)

## 2016-01-24 NOTE — Discharge Instructions (Signed)
Suicidal Feelings: How to Help Yourself °Suicide is the taking of one's own life. If you feel as though life is getting too tough to handle and are thinking about suicide, get help right away. To get help: °· Call your local emergency services (911 in the [place] °· Call a suicide hotline to speak with a trained counselor who understands how you are feeling. The following is a list of suicide hotlines in the [place]. For a list of hotlines in [place], visit www.suicide.org/hotlines/international/[place]-suicide-hotlines.html. °¨  1-800-273-TALK (1-800-273-8255). °¨  1-800-SUICIDE (1-800-784-2433). °¨  1-888-628-9454. This is a hotline for Spanish speakers. °¨  1-800-799-4TTY (1-800-799-4889). This is a hotline for TTY users. °¨  1-866-4-U-TREVOR (1-866-488-7386). This is a hotline for lesbian, gay, bisexual, transgender, or questioning youth. °· Contact a crisis center or a local suicide prevention center. To find a crisis center or suicide prevention center: °¨ Call your local hospital, clinic, community service organization, mental health center, social service provider, or health department. Ask for assistance in connecting to a crisis center. °¨ Visit www.suicidepreventionlifeline.org/getinvolved/locator for a list of crisis centers in the [place], or visit www.suicideprevention.ca/thinking-about-suicide/find-a-crisis-centre for a list of centers in [place]. °· Visit the following websites: °¨  National Suicide Prevention Lifeline: www.suicidepreventionlifeline.org °¨  Hopeline: www.hopeline.com °¨  American Foundation for Suicide Prevention: www.afsp.org °¨  The Trevor Project (for lesbian, gay, bisexual, transgender, or questioning youth): www.thetrevorproject.org °HOW CAN I HELP MYSELF FEEL BETTER? °· Promise yourself that you will not do anything drastic when you have suicidal feelings. Remember, there is hope. Many people have gotten through suicidal thoughts and feelings, and you will, too. You may  have gotten through them before, and this proves that you can get through them again. °· Let family, friends, teachers, or counselors know how you are feeling. Try not to isolate yourself from those who care about you. Remember, they will want to help you. Talk with someone every day, even if you do not feel sociable. Face-to-face conversation is best. °· Call a mental health professional and see one regularly. °· Visit your primary health care provider every year. °· Eat a well-balanced diet, and space your meals so you eat regularly. °· Get plenty of rest. °· Avoid alcohol and drugs, and remove them from your home. They will only make you feel worse. °· If you are thinking of taking a lot of medicine, give your medicine to someone who can give it to you one day at a time. If you are on antidepressants and are concerned you will overdose, let your health care provider know so he or she can give you safer medicines. Ask your mental health professional about the possible side effects of any medicines you are taking. °· Remove weapons, poisons, knives, and anything else that could harm you from your home. °· Try to stick to routines. Follow a schedule every day. Put self-care on your schedule. °· Make a list of realistic goals, and cross them off when you achieve them. Accomplishments give a sense of worth. °· Wait until you are feeling better before doing the things you find difficult or unpleasant. °· Exercise if you are able. You will feel better if you exercise for even a half hour each day. °· Go out in the sun or into nature. This will help you recover from depression faster. If you have a favorite place to walk, go there. °· Do the things that have always given you pleasure. Play your favorite music, read a good book, paint a picture, play your favorite instrument, or do anything   else that takes your mind off your depression if it is safe to do. °· Keep your living space well lit. °· When you are feeling well,  write yourself a letter about tips and support that you can read when you are not feeling well. °· Remember that life's difficulties can be sorted out with help. Conditions can be treated. You can work on thoughts and strategies that serve you well. °  °This information is not intended to replace advice given to you by your health care provider. Make sure you discuss any questions you have with your health care provider. °  °Document Released: 09/05/2002 Document Revised: 03/22/2014 Document Reviewed: 06/26/2013 °Elsevier Interactive Patient Education ©2016 Elsevier Inc. ° °

## 2016-01-24 NOTE — Discharge Summary (Signed)
Physician Discharge Summary  Nicholas Melton ZOX:[id number] DOB: 15-Jan-1971 DOA: 01/22/2016  PCP: No PCP Per Patient  Admit date: 01/22/2016 Discharge date: 01/24/2016  Recommendations for Outpatient Follow-up:  1. Pt will need to follow up with PCP in 1-2 weeks post discharge 2. Please obtain BMP to evaluate electrolytes and kidney function  Discharge Diagnoses:  Principal Problem:   Overdose Active Problems:   Alcohol use disorder, severe, dependence (HCC)   Bipolar 2 disorder, major depressive episode (HCC)   Malnutrition of moderate degree   Acute encephalopathy due to OD on Alcohol and medications   Discharge Condition: Stable  Diet recommendation: Heart healthy diet discussed in details   History of present illness:  45 y.o.male with significant PMH of psychiatric disorders, substance abuse and history of previous intentional overdose with Benadryl who presented unresponsive by EMS s/p drug overdose. Wife of patient was at bedside and provided the history. Patient is followed by [facility] and was recently started on Zyprexa. He has been drinking large amounts of alcohol these past few days. Wife estimates he drank three 12 packs of beer since morning of 11/7. Patient apparently took Klonopin, Gabapentin, and entire bottle of Zyprexa in additional to alcohol ingestion approximately one hour prior to arrival in ED. In route he was given Narcan 2 mg without improvement. In the ED, he was intubated due to inability to protect his airway. Was given propofol due to lack of synchronization with the vent. Labs were significant for ethanol level of 261.   SIGNIFICANT EVENTS: 11/9: Admitted to ICU. Intubated in ED secondary to unresponsiveness after intentional overdose.  11/10 extubated  LINES/TUBES: ETT 11/9 >>11/10 PIV x2  Foley catheter 11/9 >>11/10  Hospital Course:  Bipolar 2 disorder, major depressive episode (HCC) - Discontinue Recruitment consultant, Patient denies active  suicidal/homicidal ideation  - no signs of alcohol withdrawal  - Recent benefit from residential substance abuse treatment program or chemical dependency intensive outpatient program  - counseling provided   Inability to protect airway 2/2 to encephalopathy s/p intentional ingestion.  - Extubated 11/9 - maintaining oxygen saturation at target range   Hyponatremia, likely secondary to beer potomania, improving - stabilized, WNL this AM  Acute encephalopathy secondary to intentional overdose  History of EtOH withdrawal  - CT head shows normal appearance of the brain. - no alcohol withdrawal/DTs  Procedures/Studies: Ct Head Wo Contrast  Result Date: 01/22/2016 CLINICAL DATA:  Drug overdose.  Unresponsive. EXAM: CT HEAD WITHOUT CONTRAST TECHNIQUE: Contiguous axial images were obtained from the base of the skull through the vertex without intravenous contrast. COMPARISON:  None. FINDINGS: Brain: No evidence of malformation, atrophy, old or acute small or large vessel infarction, mass lesion, hemorrhage, hydrocephalus or extra-axial collection. No evidence of pituitary lesion. Vascular: No vascular calcification.  No hyperdense vessels. Skull: Normal.  No fracture or focal bone lesion. Sinuses/Orbits: There is mucosal thickening affecting the maxillary and ethmoid sinuses and to a lesser extent the frontal sinuses. No fluid in the middle ears or mastoids. Visualized orbits are normal. Other: None significant IMPRESSION: Normal appearance of the brain. Mild mucosal inflammation of the paranasal sinuses. Electronically Signed   By: Paulina Fusi M.D.   On: 01/22/2016 07:28   Dg Chest Port 1 View  Result Date: 01/23/2016 CLINICAL DATA:  Acute respiratory failure EXAM: PORTABLE CHEST 1 VIEW COMPARISON:  Yesterday FINDINGS: Tracheal and esophageal extubation with good lung volumes. There is no edema, consolidation, effusion, or pneumothorax. Normal heart size and mediastinal contours. IMPRESSION:  Extubation with  improved inflation. Electronically Signed   By: Marnee SpringJonathon  Watts M.D.   On: 01/23/2016 07:31   Dg Chest Portable 1 View  Result Date: 01/22/2016 CLINICAL DATA:  Overdose.  Endotracheal and enteric tube placements. EXAM: PORTABLE CHEST 1 VIEW COMPARISON:  09/17/2015 FINDINGS: Endotracheal tube present with tip measuring 7.6 cm above the carina. Enteric tube tip is in the left upper quadrant consistent with location in the upper stomach. Shallow inspiration. Normal heart size and pulmonary vascularity. No focal airspace disease or consolidation in the lungs. No blunting of costophrenic angles. No pneumothorax. Metallic piercings in the mid chest. IMPRESSION: Appliances appear in satisfactory position. Shallow inspiration. No evidence of active pulmonary disease. Electronically Signed   By: Burman NievesWilliam  Stevens M.D.   On: 01/22/2016 02:26     Discharge Exam: Vitals:   01/23/16 2100 01/24/16 0534  BP: 130/81 (!) 170/87  Pulse: 62 70  Resp: 19 20  Temp: 98.1 F (36.7 C) 98.2 F (36.8 C)   Vitals:   01/23/16 1131 01/23/16 1809 01/23/16 2100 01/24/16 0534  BP:  118/65 130/81 (!) 170/87  Pulse:  (!) 55 62 70  Resp:  18 19 20   Temp: 98.8 F (37.1 C) 98.5 F (36.9 C) 98.1 F (36.7 C) 98.2 F (36.8 C)  TempSrc: Oral Oral Oral Oral  SpO2:  98% 99% 100%  Weight:  66.2 kg (146 lb)  66 kg (145 lb 8 oz)  Height:  6\' 1"  (1.854 m)      General: Pt is alert, follows commands appropriately, not in acute distress Cardiovascular: Regular rate and rhythm, S1/S2 +, no murmurs, no rubs, no gallops Respiratory: Clear to auscultation bilaterally, no wheezing, no crackles, no rhonchi Abdominal: Soft, non tender, non distended, bowel sounds +, no guarding  Discharge Instructions  Discharge Instructions    Diet - low sodium heart healthy    Complete by:  As directed    Increase activity slowly    Complete by:  As directed        Medication List    STOP taking these medications    buPROPion 150 MG 24 hr tablet Commonly known as:  WELLBUTRIN XL   loperamide 2 MG capsule Commonly known as:  IMODIUM   Lurasidone HCl 60 MG Tabs   mirtazapine 30 MG tablet Commonly known as:  REMERON   traZODone 50 MG tablet Commonly known as:  DESYREL     TAKE these medications   cetirizine 10 MG tablet Commonly known as:  ZYRTEC Take 10 mg by mouth daily.   clonazePAM 1 MG tablet Commonly known as:  KLONOPIN Take 0.5 mg by mouth 2 (two) times daily.   gabapentin 400 MG capsule Commonly known as:  NEURONTIN Take 400 mg by mouth 3 (three) times daily.   OLANZapine 5 MG tablet Commonly known as:  ZYPREXA Take 5 mg by mouth daily.      Follow-up Information    MAGICK-Teddie Curd, Sherlon HandingISKRA, MD Follow up.   Specialty:  Internal Medicine Contact information: [address] [place] [address] [phone]            The results of significant diagnostics from this hospitalization (including imaging, microbiology, ancillary and laboratory) are listed below for reference.     Microbiology: Recent Results (from the past 240 hour(s))  MRSA PCR Screening     Status: None   Collection Time: 01/22/16  5:52 AM  Result Value Ref Range Status   MRSA by PCR NEGATIVE NEGATIVE Final  Comment:        The GeneXpert MRSA Assay (FDA approved for NASAL specimens only), is one component of a comprehensive MRSA colonization surveillance program. It is not intended to diagnose MRSA infection nor to guide or monitor treatment for MRSA infections.      Labs: Basic Metabolic Panel:  Recent Labs Lab 01/22/16 0207 01/22/16 0217 01/22/16 0415 01/22/16 1147 01/23/16 0242 01/24/16 0635  NA 129* 130*  --  132* 135 139  K 4.5 4.7  --  3.5 3.8 3.7  CL 94* 95*  --  100* 106 106  CO2 22  --   --  19* 25 25  GLUCOSE 119* 110*  --  87 108* 90  BUN <5* 3*  --  6 6 6   CREATININE 0.57* 0.70 0.55* 0.68 0.64 0.66  CALCIUM 8.7*  --   --  8.2* 8.3* 8.5*  MG   --   --   --  1.9 2.3 2.2  PHOS  --   --   --  2.7 3.4 3.5   Liver Function Tests:  Recent Labs Lab 01/22/16 0207 01/23/16 0242  AST 49* 25  ALT 27 19  ALKPHOS 43 42  BILITOT 1.1 1.4*  PROT 7.2 5.5*  ALBUMIN 4.8 3.7    Recent Labs Lab 01/22/16 0651  LIPASE 27  AMYLASE 47   CBC:  Recent Labs Lab 01/22/16 0207 01/22/16 0217 01/22/16 0415 01/23/16 0242 01/24/16 0635  WBC 5.3  --  5.1 5.6 5.9  NEUTROABS 2.7  --   --  3.3  --   HGB 13.9 14.6 13.9 12.6* 12.3*  HCT 39.1 43.0 39.3 34.5* 36.1*  MCV 96.1  --  96.3 96.6 98.4  PLT 238  --  218 192 210   Cardiac Enzymes:  Recent Labs Lab 01/22/16 0207 01/22/16 0651 01/22/16 1147 01/22/16 1703  CKTOTAL  --  173  --   --   CKMB  --  3.0  --   --   TROPONINI <0.03 <0.03 <0.03 <0.03   CBG:  Recent Labs Lab 01/22/16 1935 01/22/16 2330 01/23/16 0422 01/23/16 0817 01/23/16 1130  GLUCAP 117* 109* 103* 114* 114*     SIGNED: Time coordinating discharge: 30 minutes  MAGICK-Tenna Lacko, MD  [facility] 01/24/2016, 11:25 AM Pager [phone]  If 7PM-7AM, please contact night-coverage [email or web address] Password [email or web address]

## 2016-01-24 NOTE — Clinical Social Work Note (Signed)
Outpatient psych resources provided to the patient.  Roddie McBryant Vishruth Seoane MSW, [place], [place], [phone]

## 2016-01-24 NOTE — Progress Notes (Signed)
Discharge instructions gone over with patient. Home medications gone over. Social worker provided patient with outpatient clinics to make appointment. Patient to follow up with Dr. Sherlon HandingIskra Melton also. Diet and drinking alcohol discussed. Patient states no suicidal thoughts or plans at this time. Patient verbalized understanding of instructions.

## 2016-02-29 ENCOUNTER — Encounter (HOSPITAL_COMMUNITY): Payer: Self-pay | Admitting: Emergency Medicine

## 2016-02-29 ENCOUNTER — Ambulatory Visit (HOSPITAL_COMMUNITY)
Admission: EM | Admit: 2016-02-29 | Discharge: 2016-02-29 | Disposition: A | Discharge status: Home / Self Care | Payer: Self-pay | Attending: Emergency Medicine | Admitting: Emergency Medicine

## 2016-02-29 ENCOUNTER — Other Ambulatory Visit: Payer: Self-pay

## 2016-02-29 DIAGNOSIS — F419 Anxiety disorder, unspecified: Secondary | ICD-10-CM

## 2016-02-29 DIAGNOSIS — R002 Palpitations: Secondary | ICD-10-CM

## 2016-02-29 NOTE — ED Provider Notes (Signed)
[facility]    CSN: [id number] Arrival date & time: 02/29/16  1927     History   Chief Complaint Chief Complaint  Patient presents with  . Palpitations    HPI Nicholas Melton is a 45 y.o. male.   HPI  He is a 45 year old man here with his wife for evaluation of palpitations and chest tightness. He states this started yesterday evening with palpitations and a feeling of chest tightness with shortness of breath. He will sometimes feel a little dizzy, but not consistently. No nausea. No syncope. No chest pain. He does have a history of anxiety and thinks this may be a panic attack. Symptoms have been persistent since last night. He is taking his medications as prescribed. He was recently started on lithium.  Past Medical History:  Diagnosis Date  . Anxiety   . Bipolar 2 disorder (HCC)   . Depression   . Hypertension   . Night terrors, adult   . OCD (obsessive compulsive disorder)   . Panic attacks   . Posttraumatic stress disorder   . Social phobia     Patient Active Problem List   Diagnosis Date Noted  . Acute encephalopathy   . Overdose 01/22/2016  . Malnutrition of moderate degree 01/22/2016  . Intentional drug overdose (HCC)   . Acute respiratory failure with hypoxemia (HCC)   . Alcohol intoxication with delirium (HCC)   . Alcohol use disorder, severe, dependence (HCC) 02/14/2015  . Bipolar 2 disorder, major depressive episode (HCC) 02/14/2015  . Alcohol dependence (HCC) 06/17/2011  . MDD (major depressive disorder) 06/15/2011  . Alcohol consumption of more than four drinks per day 06/15/2011  . PTSD (post-traumatic stress disorder) 06/15/2011    Class: Acute  . Homeless 06/15/2011    Past Surgical History:  Procedure Laterality Date  . THYROIDECTOMY    . VASECTOMY         Home Medications    Prior to Admission medications   Medication Sig Start Date End Date Taking? Authorizing Provider  clonazePAM (KLONOPIN) 1 MG tablet Take 0.5 mg by  mouth 2 (two) times daily.    Yes Historical Provider, MD  gabapentin (NEURONTIN) 400 MG capsule Take 400 mg by mouth 3 (three) times daily.   Yes Historical Provider, MD  lithium carbonate 150 MG capsule Take 150 mg by mouth 3 (three) times daily with meals.   Yes Historical Provider, MD  propranolol (INDERAL) 40 MG tablet Take 40 mg by mouth 3 (three) times daily.   Yes Historical Provider, MD  cetirizine (ZYRTEC) 10 MG tablet Take 10 mg by mouth daily.    Historical Provider, MD  OLANZapine (ZYPREXA) 5 MG tablet Take 5 mg by mouth daily.    Historical Provider, MD    Family History Family History  Problem Relation Age of Onset  . Cancer Brother     Social History Social History  Substance Use Topics  . Smoking status: Current Every Day Smoker    Packs/day: 2.00    Years: 15.00    Types: Cigarettes  . Smokeless tobacco: Former NeurosurgeonUser  . Alcohol use 67.2 oz/week    112 Cans of beer per week     Allergies   Gabapentin; Risperidone and related; Seroquel [quetiapine]; Depakote er [divalproex sodium er]; Ambien [zolpidem tartrate]; and Lamictal [lamotrigine]   Review of Systems Review of Systems As in history of present illness  Physical Exam Triage Vital Signs ED Triage Vitals  Enc Vitals Group     BP  02/29/16 1934 124/85     Pulse Rate 02/29/16 1934 100     Resp 02/29/16 1934 16     Temp 02/29/16 1934 98.2 F (36.8 C)     Temp Source 02/29/16 1934 Oral     SpO2 02/29/16 1934 100 %     Weight --      Height --      Head Circumference --      Peak Flow --      Pain Score 02/29/16 1944 5     Pain Loc --      Pain Edu? --      Excl. in GC? --    No data found.   Updated Vital Signs BP 124/85 (BP Location: Left Arm)   Pulse 100   Temp 98.2 F (36.8 C) (Oral)   Resp 16   SpO2 100%   Visual Acuity Right Eye Distance:   Left Eye Distance:   Bilateral Distance:    Right Eye Near:   Left Eye Near:    Bilateral Near:     Physical Exam  Constitutional: He  is oriented to person, place, and time. He appears well-developed and well-nourished. No distress.  Neck: Neck supple.  Cardiovascular: Normal rate, regular rhythm and normal heart sounds.   No murmur heard. Pulmonary/Chest: Effort normal and breath sounds normal. No respiratory distress. He has no wheezes. He has no rales.  Neurological: He is alert and oriented to person, place, and time.     UC Treatments / Results  Labs (all labs ordered are listed, but only abnormal results are displayed) Labs Reviewed - No data to display  EKG  EKG Interpretation None     ED ECG REPORT   Date: 02/29/2016  Rate: 96  Rhythm: normal sinus rhythm  QRS Axis: normal  Intervals: normal  ST/T Wave abnormalities: normal  Conduction Disutrbances:none  Narrative Interpretation: NSR, normal ekg  Old EKG Reviewed: none available  I have personally reviewed the EKG tracing and agree with the computerized printout as noted.   Radiology No results found.  Procedures Procedures (including critical care time)  Medications Ordered in UC Medications - No data to display   Initial Impression / Assessment and Plan / UC Course  I have reviewed the triage vital signs and the nursing notes.  Pertinent labs & imaging results that were available during my care of the patient were reviewed by me and considered in my medical decision making (see chart for details).  Clinical Course     No sign of cardiac etiology. EKG is normal. Likely related to stress and anxiety in his life. He will continue his current medications. Recommended deep breathing exercises, meditation, or visualization to help manage stress. Follow up as needed.  Final Clinical Impressions(s) / UC Diagnoses   Final diagnoses:  Anxiety  Palpitations    New Prescriptions Discharge Medication List as of 02/29/2016  7:57 PM       Charm RingsErin J Bahar Shelden, MD 02/29/16 2007

## 2016-02-29 NOTE — Discharge Instructions (Signed)
Your EKG is normal. This is likely coming from stress and anxiety. Continue your medications as prescribed. Deep breathing exercises, meditation, and visualization can help manage stress. Follow-up as needed.

## 2016-02-29 NOTE — ED Triage Notes (Signed)
[facility] a [facility] what he believed to be palpitations or possibly a "panic attack." The patient reported that he has a feeling of chest tightness. The patient also reported a hx of anxiety.

## 2017-10-20 IMAGING — CT CT HEAD W/O CM
3 series · 15 of 30 positions shown, 17 images · non-contrast
Comparison: None.

CLINICAL DATA: Drug overdose.  Unresponsive.

EXAM:
CT HEAD WITHOUT CONTRAST
TECHNIQUE: Contiguous axial images were obtained from the base of the skull
through the vertex without intravenous contrast.

[Series 2: head without · axial · non-contrast · 0.43mm/px · z∈[-95,+5]mm · 4 of 34 slices shown]
[im 7/34  brain]
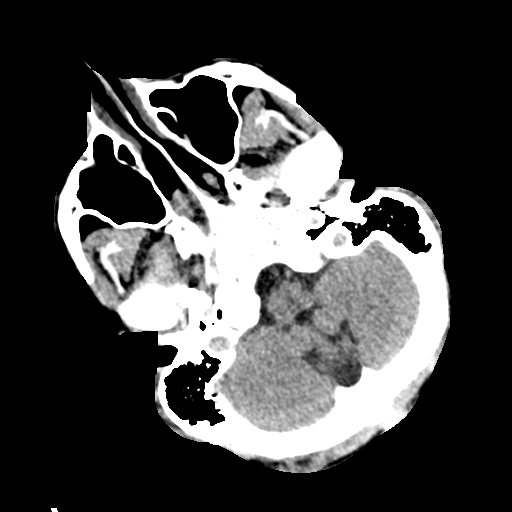
[im 14/34  brain]
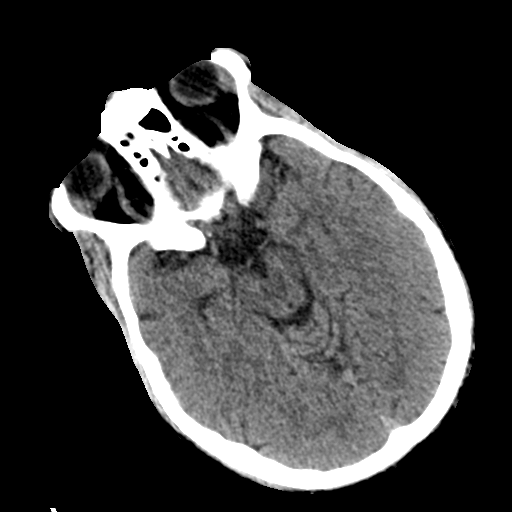
[im 20/34  brain]
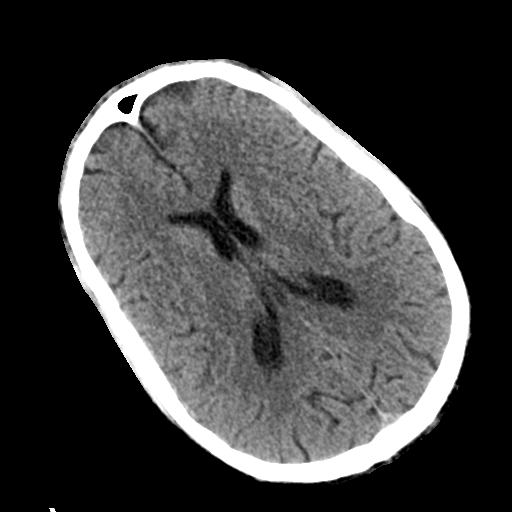
[im 27/34  brain]
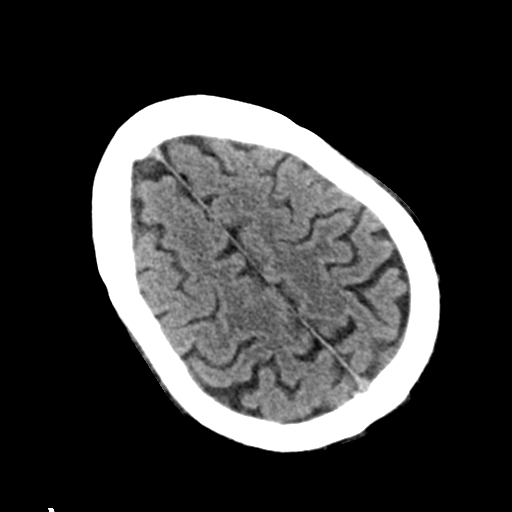

[Series 3: head bone · axial · 0.43mm/px · z∈[-115,-11]mm · 6 of 85 slices shown]
[im 6/85  bone]
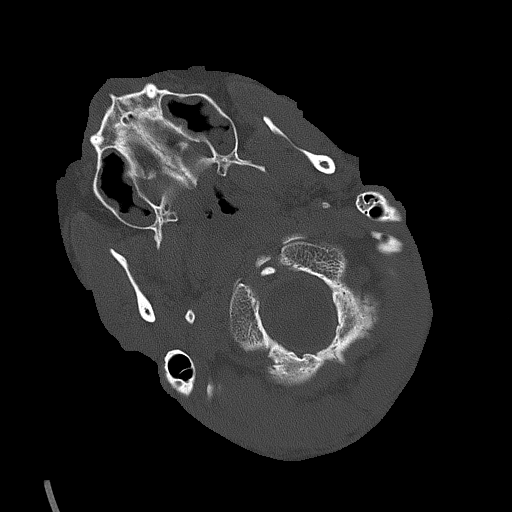
[im 16/85  bone]
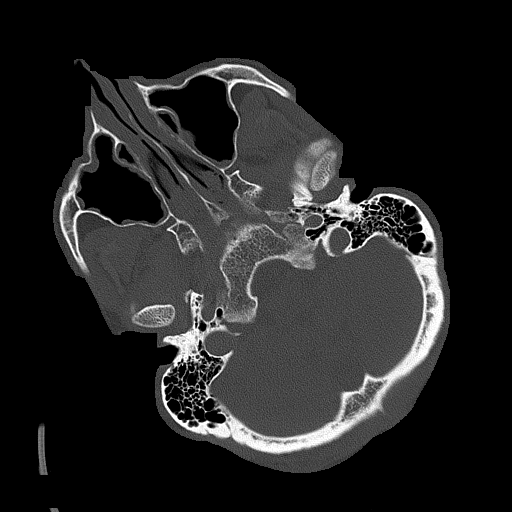
[im 27/85  bone]
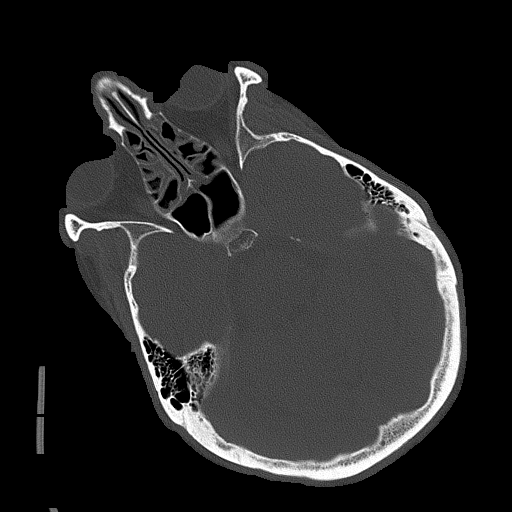
[im 37/85  bone]
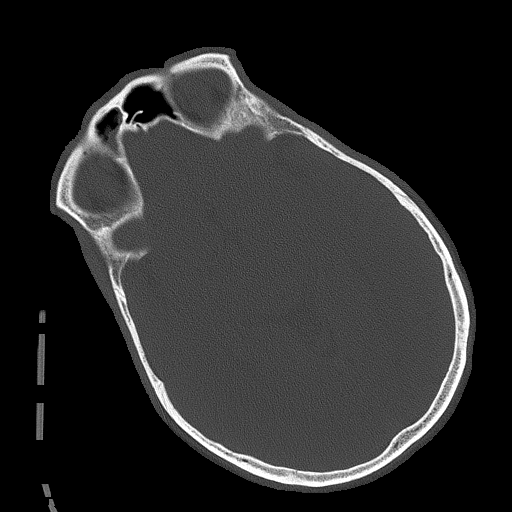
[im 48/85  bone]
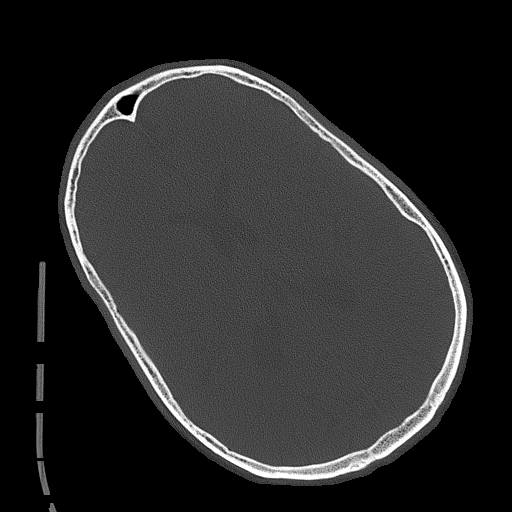
[im 58/85  bone]
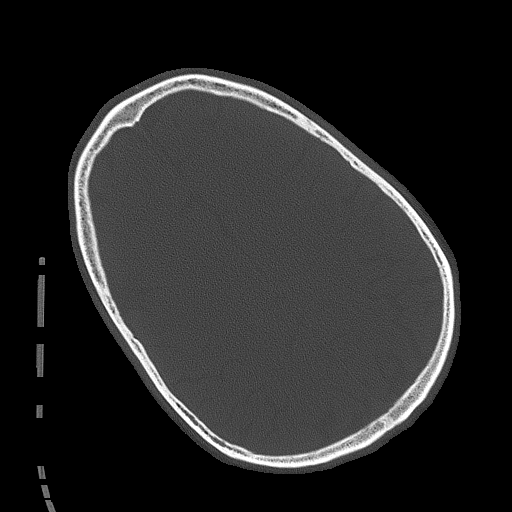

[Series 6: head without ax · axial · non-contrast · 0.29mm/px · z∈[-95,+10]mm · 5 of 33 slices shown, 7 images]
[im 6/33  brain]
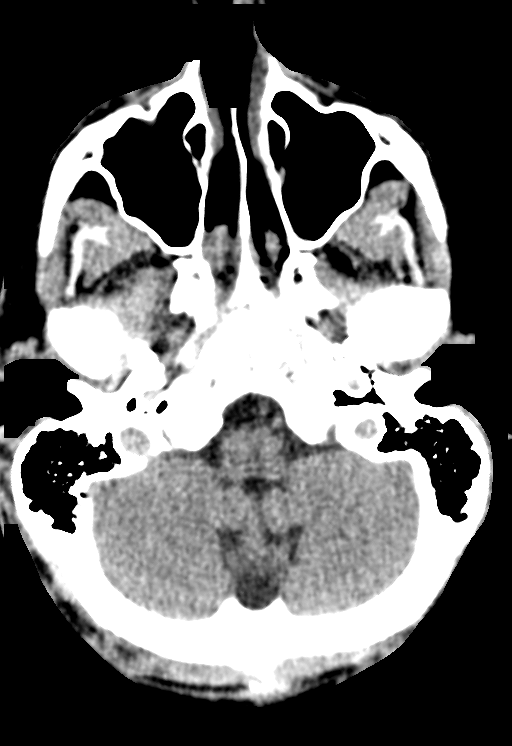
[im 6/33  bone]
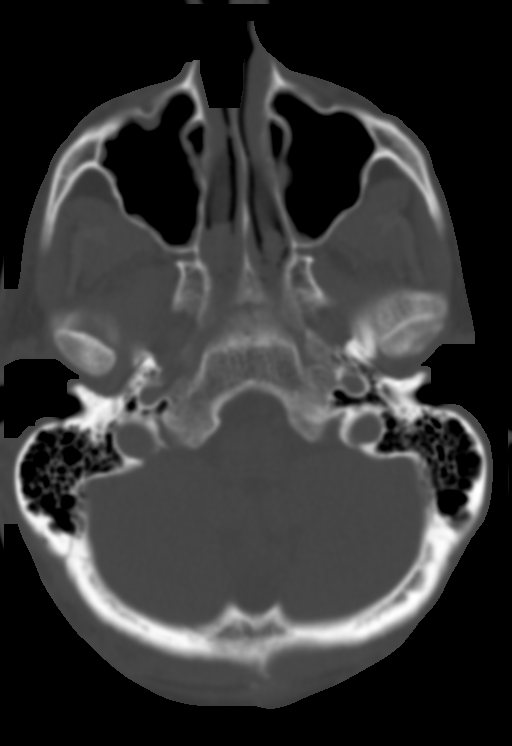
[im 11/33  brain]
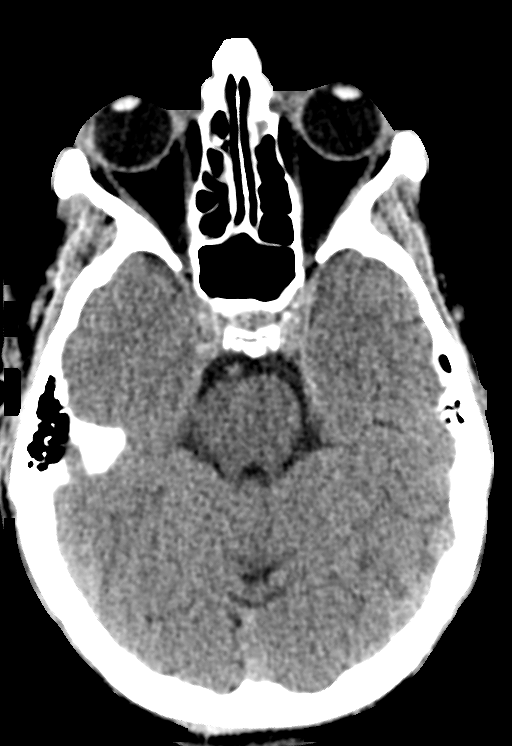
[im 17/33  brain]
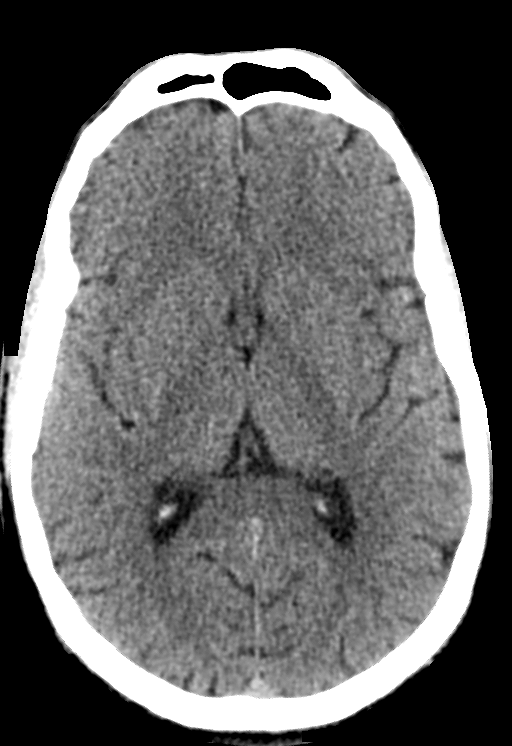
[im 22/33  brain]
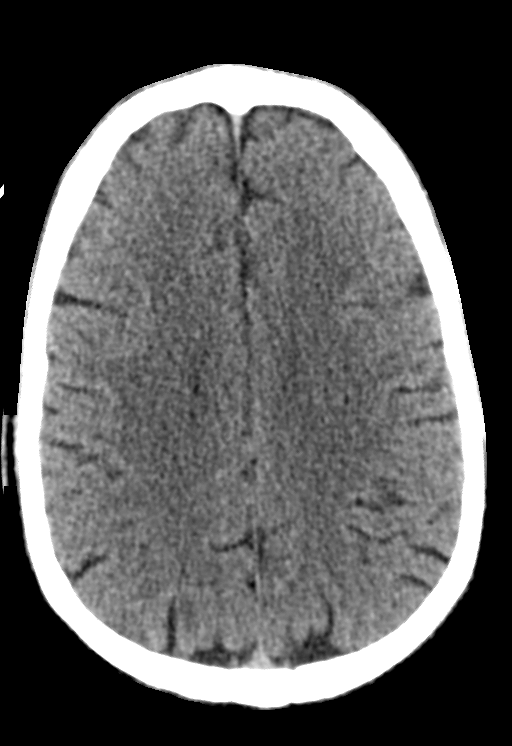
[im 27/33  brain]
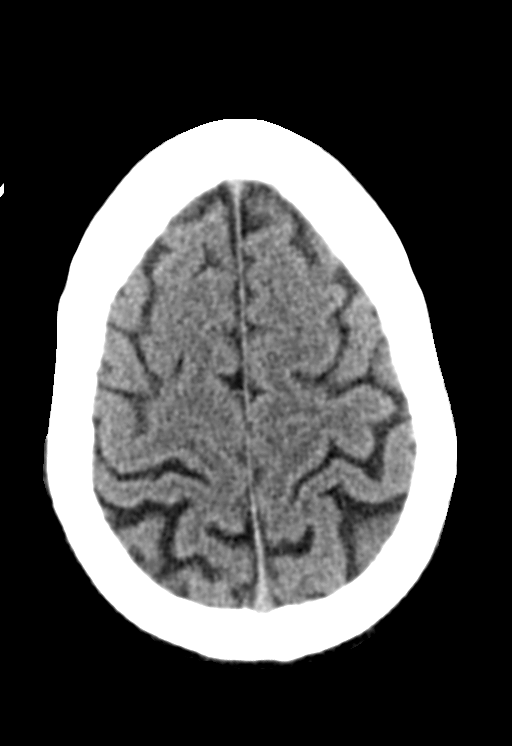
[im 27/33  bone]
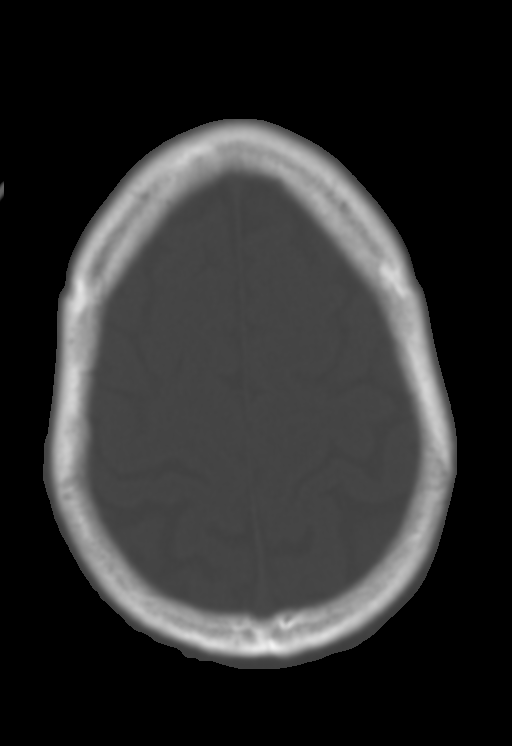

[15 of 30 positions shown; findings below may reference images not displayed]

FINDINGS: Brain: No evidence of malformation, atrophy, old or acute small or
large vessel infarction, mass lesion, hemorrhage, hydrocephalus or
extra-axial collection. No evidence of pituitary lesion.

Vascular: No vascular calcification.  No hyperdense vessels.

Skull: Normal.  No fracture or focal bone lesion.

Sinuses/Orbits: There is mucosal thickening affecting the maxillary
and ethmoid sinuses and to a lesser extent the frontal sinuses. No
fluid in the middle ears or mastoids. Visualized orbits are normal.

Other: None significant
IMPRESSION: Normal appearance of the brain.

Mild mucosal inflammation of the paranasal sinuses.

## 2017-11-10 ENCOUNTER — Emergency Department (HOSPITAL_COMMUNITY)
Admission: EM | Admit: 2017-11-10 | Discharge: 2017-11-11 | Disposition: A | Discharge status: Other Acute Inpatient Hospital | Payer: Self-pay | Attending: Emergency Medicine | Admitting: Emergency Medicine

## 2017-11-10 ENCOUNTER — Other Ambulatory Visit: Payer: Self-pay

## 2017-11-10 ENCOUNTER — Encounter (HOSPITAL_COMMUNITY): Payer: Self-pay

## 2017-11-10 DIAGNOSIS — F17210 Nicotine dependence, cigarettes, uncomplicated: Secondary | ICD-10-CM | POA: Insufficient documentation

## 2017-11-10 DIAGNOSIS — Z79899 Other long term (current) drug therapy: Secondary | ICD-10-CM | POA: Insufficient documentation

## 2017-11-10 DIAGNOSIS — R45851 Suicidal ideations: Secondary | ICD-10-CM | POA: Insufficient documentation

## 2017-11-10 DIAGNOSIS — F32A Depression, unspecified: Secondary | ICD-10-CM

## 2017-11-10 DIAGNOSIS — I10 Essential (primary) hypertension: Secondary | ICD-10-CM | POA: Insufficient documentation

## 2017-11-10 DIAGNOSIS — F329 Major depressive disorder, single episode, unspecified: Secondary | ICD-10-CM | POA: Insufficient documentation

## 2017-11-10 LAB — BASIC METABOLIC PANEL
ANION GAP: 13 (ref 5–15)
BUN: 8 mg/dL (ref 6–20)
CO2: 23 mmol/L (ref 22–32)
Calcium: 9 mg/dL (ref 8.9–10.3)
Chloride: 100 mmol/L (ref 98–111)
Creatinine, Ser: 0.61 mg/dL (ref 0.61–1.24)
GFR calc Af Amer: >60 mL/min (ref >60)
GFR calc non Af Amer: >60 mL/min (ref >60)
GLUCOSE: 72 mg/dL (ref 70–99)
POTASSIUM: 3.7 mmol/L (ref 3.5–5.1)
Sodium: 136 mmol/L (ref 135–145)

## 2017-11-10 LAB — RAPID URINE DRUG SCREEN, HOSP PERFORMED
Amphetamines: NOT DETECTED
BARBITURATES: NOT DETECTED
BENZODIAZEPINES: NOT DETECTED
Cocaine: NOT DETECTED
Opiates: NOT DETECTED
Tetrahydrocannabinol: NOT DETECTED

## 2017-11-10 LAB — CBC WITH DIFFERENTIAL/PLATELET
BASOS ABS: 0 10*3/uL (ref 0.0–0.1)
Basophils Relative: 0 %
Eosinophils Absolute: 0.2 10*3/uL (ref 0.0–0.7)
Eosinophils Relative: 3 %
HEMATOCRIT: 44.5 % (ref 39.0–52.0)
Hemoglobin: 15.6 g/dL (ref 13.0–17.0)
LYMPHS PCT: 44 %
Lymphs Abs: 3.1 10*3/uL (ref 0.7–4.0)
MCH: 34.1 pg — ABNORMAL HIGH (ref 26.0–34.0)
MCHC: 35.1 g/dL (ref 30.0–36.0)
MCV: 97.2 fL (ref 78.0–100.0)
MONO ABS: 0.5 10*3/uL (ref 0.1–1.0)
MONOS PCT: 7 %
NEUTROS ABS: 3.2 10*3/uL (ref 1.7–7.7)
Neutrophils Relative %: 46 %
Platelets: 275 10*3/uL (ref 150–400)
RBC: 4.58 MIL/uL (ref 4.22–5.81)
RDW: 13.8 % (ref 11.5–15.5)
WBC: 7.1 10*3/uL (ref 4.0–10.5)

## 2017-11-10 MED ORDER — ONDANSETRON HCL 4 MG PO TABS: 4.0000 mg | ORAL_TABLET | Freq: Three times a day (TID) | ORAL | Status: DC | PRN

## 2017-11-10 MED ORDER — NICOTINE 21 MG/24HR TD PT24: 21.0000 mg | MEDICATED_PATCH | Freq: Every day | TRANSDERMAL | Status: DC | PRN

## 2017-11-10 MED ORDER — ALUM & MAG HYDROXIDE-SIMETH 200-200-20 MG/5ML PO SUSP: 30.0000 mL | Freq: Four times a day (QID) | ORAL | Status: DC | PRN

## 2017-11-10 MED ORDER — ACETAMINOPHEN 325 MG PO TABS: 650.0000 mg | ORAL_TABLET | ORAL | Status: DC | PRN

## 2017-11-10 NOTE — ED Notes (Signed)
Bed: [id number] Expected date:  Expected time:  Means of arrival:  Comments: TRIAGE 2

## 2017-11-10 NOTE — ED Provider Notes (Signed)
[facility]   CSN: [id number] Arrival date & time: 11/10/17  2038     History   Chief Complaint Chief Complaint  Patient presents with  . Suicidal    HPI Nicholas Melton is a 47 y.o. male.  HPI  Pt was seen at 2045. Per pt, c/o gradual onset and worsening of persistent depression and SI for the past several months. Pt states he "doesn't feel like I can do anything right in life." Endorses SI with plan to "go to a gun range." Denies SA, no HI, no hallucinations.   Past Medical History:  Diagnosis Date  . Anxiety   . Bipolar 2 disorder (HCC)   . Depression   . Hypertension   . Night terrors, adult   . OCD (obsessive compulsive disorder)   . Panic attacks   . Posttraumatic stress disorder   . Social phobia     Patient Active Problem List   Diagnosis Date Noted  . Acute encephalopathy   . Overdose 01/22/2016  . Malnutrition of moderate degree 01/22/2016  . Intentional drug overdose (HCC)   . Acute respiratory failure with hypoxemia (HCC)   . Alcohol intoxication with delirium (HCC)   . Alcohol use disorder, severe, dependence (HCC) 02/14/2015  . Bipolar 2 disorder, major depressive episode (HCC) 02/14/2015  . Alcohol dependence (HCC) 06/17/2011  . MDD (major depressive disorder) 06/15/2011  . Alcohol consumption of more than four drinks per day 06/15/2011  . PTSD (post-traumatic stress disorder) 06/15/2011    Class: Acute  . Homeless 06/15/2011    Past Surgical History:  Procedure Laterality Date  . THYROIDECTOMY    . VASECTOMY          Home Medications    Prior to Admission medications   Medication Sig Start Date End Date Taking? Authorizing Provider  cetirizine (ZYRTEC) 10 MG tablet Take 10 mg by mouth daily.    [provider]  clonazePAM (KLONOPIN) 1 MG tablet Take 0.5 mg by mouth 2 (two) times daily.     [provider]  gabapentin (NEURONTIN) 400 MG capsule Take 400 mg by mouth 3  (three) times daily.    [provider]  lithium carbonate 150 MG capsule Take 150 mg by mouth 3 (three) times daily with meals.    [provider]  OLANZapine (ZYPREXA) 5 MG tablet Take 5 mg by mouth daily.    [provider]  propranolol (INDERAL) 40 MG tablet Take 40 mg by mouth 3 (three) times daily.    [provider]    Family History Family History  Problem Relation Age of Onset  . Cancer Brother     Social History Social History   Tobacco Use  . Smoking status: Current Every Day Smoker    Packs/day: 2.00    Years: 15.00    Pack years: 30.00    Types: Cigarettes  . Smokeless tobacco: Former Engineer, waterUser  Substance Use Topics  . Alcohol use: Yes    Alcohol/week: 112.0 standard drinks    Types: 112 Cans of beer per week  . Drug use: Yes    Types: Cocaine    Comment: reports uses appr. once a year     Allergies   Gabapentin; Risperidone and related; Seroquel [quetiapine]; Depakote er [divalproex sodium er]; Ambien [zolpidem tartrate]; and Lamictal [lamotrigine]   Review of Systems Review of Systems ROS: Statement: All systems negative except as marked or noted in the HPI; Constitutional: Negative for fever and  chills. ; ; Eyes: Negative for eye pain, redness and discharge. ; ; ENMT: Negative for ear pain, hoarseness, nasal congestion, sinus pressure and sore throat. ; ; Cardiovascular: Negative for chest pain, palpitations, diaphoresis, dyspnea and peripheral edema. ; ; Respiratory: Negative for cough, wheezing and stridor. ; ; Gastrointestinal: Negative for nausea, vomiting, diarrhea, abdominal pain, blood in stool, hematemesis, jaundice and rectal bleeding. . ; ; Genitourinary: Negative for dysuria, flank pain and hematuria. ; ; Musculoskeletal: Negative for back pain and neck pain. Negative for swelling and trauma.; ; Skin: Negative for pruritus, rash, abrasions, blisters, bruising and skin lesion.; ; Neuro: Negative for headache,  lightheadedness and neck stiffness. Negative for weakness, altered level of consciousness, altered mental status, extremity weakness, paresthesias, involuntary movement, seizure and syncope.; Psych:  +SI with plan. No SA, no HI, no hallucinations.     Physical Exam Updated Vital Signs BP (!) 180/128 (BP Location: Right Arm)   Pulse 62   Temp 97.6 F (36.4 C) (Oral)   Resp 18   SpO2 100%   Physical Exam 2050: Physical examination:  Nursing notes reviewed; Vital signs and O2 SAT reviewed;  Constitutional: Well developed, Well nourished, Well hydrated, In no acute distress; Head:  Normocephalic, atraumatic; Eyes: EOMI, PERRL, No scleral icterus; ENMT: Mouth and pharynx normal, Mucous membranes moist; Neck: Supple, Full range of motion; Cardiovascular: Regular rate and rhythm; Respiratory: Breath sounds clear, No wheezes.  Speaking full sentences with ease, Normal respiratory effort/excursion; Chest: No deformity, Movement normal; Abdomen: Nondistended; Extremities: No deformity.; Neuro: AA&Ox3, Major CN grossly intact.  Speech clear. No gross focal motor deficits in extremities. Climbs on and off stretcher easily by himself. Gait steady.; Skin: Color normal, Warm, Dry.; Psych:  Crying. Endorses SI.    ED Treatments / Results  Labs (all labs ordered are listed, but only abnormal results are displayed)   EKG None  Radiology   Procedures Procedures (including critical care time)  Medications Ordered in ED Medications - No data to display   Initial Impression / Assessment and Plan / ED Course  I have reviewed the triage vital signs and the nursing notes.  Pertinent labs & imaging results that were available during my care of the patient were reviewed by me and considered in my medical decision making (see chart for details).  MDM Reviewed: previous chart, nursing note and vitals     2230:  Labs and TTS evaluation pending.     Final Clinical Impressions(s) / ED Diagnoses    Final diagnoses:  None    ED Discharge Orders    None       Samuel Jester, DO 11/10/17 2230

## 2017-11-10 NOTE — BH Assessment (Addendum)
Assessment Note  Nicholas Melton is an 47 y.o. male who presents to the ED voluntarily accompanied by his wife. Pt reports ongoing depression and suicidal thoughts with a plan to go to a gun range, get a gun, and shoot himself. Pt states he began feeling worse over the past 3 months with no identifying trigger. Pt states he is unhappy with his job but denies any other stressors. Pt states he began to consume heavy amounts of alcohol 3 months ago after being sober for 1 year. Pt admits he has attempted suicide in the past by overdosing on medication. Pt's wife is present at bedside with the pt's permission. Pt's wife states she has observed the pt being more withdrawn at home, sleeping for up to 15 hours a day. Pt's wife also states she noticed the pt has been increasingly irritable and unhappy at home with a somber and depressed disposition. Pt states he continues to have bad thoughts about wanting to harm himself and he does not feel safe with himself. Pt is unable to contract for safety. Pt denies HI and denies AVH. Pt states he is followed by [facility] for OPT MH treatment but admits he has not gone in several months due to canceling appointments. Pt has been admitted to multiple inpt facilities c/o Bipolar depression and suicide attempts including [facility] and [facility].   Per Nira ConnJason Berry, NP pt is recommended for inpt treatment. [facility] to review for possible [facility]. Pt does not have labs or UDS at this time. TCU nurse advised that labs are needed [facility] EDP has been advised Samuel JesterMcManus, Kathleen, DO.   Diagnosis: Bipolar I disorder, Current or most recent episode depressed; Alcohol use disorder, severe   Past Medical History:  Past Medical History:  Diagnosis Date  . Anxiety   . Bipolar 2 disorder (HCC)   . Depression   . Hypertension   . Night terrors, adult   . OCD (obsessive compulsive disorder)   . Panic attacks   . Posttraumatic stress disorder   .  Social phobia     Past Surgical History:  Procedure Laterality Date  . THYROIDECTOMY    . VASECTOMY      Family History:  Family History  Problem Relation Age of Onset  . Cancer Brother     Social History:  reports that he has been smoking cigarettes. He has a 30.00 pack-year smoking history. He has quit using smokeless tobacco. He reports that he drinks about 112.0 standard drinks of alcohol per week. He reports that he has current or past drug history. Drug: Cocaine.  Additional Social History:  Alcohol / Drug Use Pain Medications: See MAR Prescriptions: See MAR Over the Counter: See MAR History of alcohol / drug use?: Yes Longest period of sobriety (when/how long): 1 year Negative Consequences of Use: Financial, Work / School Withdrawal Symptoms: Agitation, Irritability, Tremors, Change in blood pressure Substance #1 Name of Substance 1: Alcohol 1 - Age of First Use: 20s 1 - Amount (size/oz): excess 1 - Frequency: daily 1 - Duration: ongoing 1 - Last Use / Amount: 11/10/17  CIWA: CIWA-Ar BP: 112/80 Pulse Rate: 62 COWS:    Allergies:  Allergies  Allergen Reactions  . Risperidone And Related Other (See Comments)    Faint, dizzy, lethargic   . Seroquel [Quetiapine] Other (See Comments)    Faint, dizzy, lethargic   . Depakote Er [Divalproex Sodium Er] Diarrhea  . Ambien [Zolpidem Tartrate] Other (  See Comments)    Hallucinations   . Lamictal [Lamotrigine] Nausea And Vomiting    Home Medications:  (Not in a hospital admission)  OB/GYN Status:  No LMP for male patient.  General Assessment Data Location of Assessment: [facility] ED TTS Assessment: In system Is this a Tele or Face-to-Face Assessment?: Face-to-Face Is this an Initial Assessment or a Re-assessment for this encounter?: Initial Assessment Patient Accompanied by:: N/A Language Other than English: No Living Arrangements: Other (Comment)(with wife ) What gender do you identify as?: Male Marital status:  Married Pregnancy Status: No Living Arrangements: Spouse/significant other Can pt return to current living arrangement?: Yes Admission Status: Voluntary Is patient capable of signing voluntary admission?: Yes Referral Source: Self/Family/Friend Insurance type: none     Crisis Care Plan Living Arrangements: Spouse/significant other Name of Psychiatrist: [facility] Name of Therapist: [facility]  Education Status Is patient currently in school?: No Is the patient employed, unemployed or receiving disability?: Employed  Risk to self with the past 6 months Suicidal Ideation: Yes-Currently Present Has patient been a risk to self within the past 6 months prior to admission? : No Suicidal Intent: No Has patient had any suicidal intent within the past 6 months prior to admission? : No Is patient at risk for suicide?: Yes Suicidal Plan?: Yes-Currently Present Has patient had any suicidal plan within the past 6 months prior to admission? : Yes Specify Current Suicidal Plan: pt states he thought of going to the gun range  Access to Means: Yes Specify Access to Suicidal Means: pt has access to a gun range  What has been your use of drugs/alcohol within the last 12 months?: excessive alcohol use within the past 3 months  Previous Attempts/Gestures: Yes How many times?: 3 Other Self Harm Risks: hx of suicude attempts, access to lethal methods, hx of depression, hx of substance abuse  Triggers for Past Attempts: Unpredictable Intentional Self Injurious Behavior: None Family Suicide History: No Recent stressful life event(s): Other (Comment)(unhappy with job ) Persecutory voices/beliefs?: No Depression: Yes Depression Symptoms: Despondent, Tearfulness, Fatigue, Loss of interest in usual pleasures, Feeling worthless/self pity, Feeling angry/irritable Substance abuse history and/or treatment for substance abuse?: Yes Suicide prevention information given to non-admitted patients: Not  applicable  Risk to Others within the past 6 months Homicidal Ideation: No Does patient have any lifetime risk of violence toward others beyond the six months prior to admission? : No Thoughts of Harm to Others: No Current Homicidal Intent: No Current Homicidal Plan: No Access to Homicidal Means: No History of harm to others?: No Assessment of Violence: None Noted Does patient have access to weapons?: No Criminal Charges Pending?: No Does patient have a court date: No Is patient on probation?: No  Psychosis Hallucinations: None noted Delusions: None noted  Mental Status Report Appearance/Hygiene: In scrubs, Unremarkable Eye Contact: Good Motor Activity: Freedom of movement Speech: Logical/coherent Level of Consciousness: Alert Mood: Depressed, Despair Affect: Depressed, Flat Anxiety Level: None Thought Processes: Relevant, Coherent Judgement: Impaired Orientation: Person, Time, Situation, Place, Appropriate for developmental age Obsessive Compulsive Thoughts/Behaviors: None  Cognitive Functioning Concentration: Normal Memory: Remote Intact, Recent Intact Is patient IDD: No Insight: Poor Impulse Control: Poor Appetite: Good Have you had any weight changes? : No Change Sleep: Increased Total Hours of Sleep: 13 Vegetative Symptoms: Staying in bed  ADLScreening [facility] Assessment Services) Patient's cognitive ability adequate to safely complete daily activities?: Yes Patient able to express need for assistance with ADLs?: Yes Independently performs ADLs?: Yes (appropriate for developmental age)  Prior Inpatient Therapy Prior Inpatient Therapy: Yes Prior Therapy Dates: 2016, 2013, 2003 Prior Therapy Facilty/Provider(s): [facility], [facility] Reason for Treatment: BIPOLAR, SUBSTANCE ABUSE  Prior Outpatient Therapy Prior Outpatient Therapy: Yes Prior Therapy Dates: CURRENT Prior Therapy Facilty/Provider(s): [facility] Reason for Treatment: MED MANAGEMENT, ONGOING MH  NEEDS Does patient have an ACCT team?: No Does patient have Intensive In-House Services?  : No Does patient have [facility] services? : No Does patient have P4CC services?: No  ADL Screening (condition at time of admission) Patient's cognitive ability adequate to safely complete daily activities?: Yes Is the patient deaf or have difficulty hearing?: No Does the patient have difficulty seeing, even when wearing glasses/contacts?: No Does the patient have difficulty concentrating, remembering, or making decisions?: No Patient able to express need for assistance with ADLs?: Yes Does the patient have difficulty dressing or bathing?: No Independently performs ADLs?: Yes (appropriate for developmental age) Does the patient have difficulty walking or climbing stairs?: No Weakness of Legs: None Weakness of Arms/Hands: None  Home Assistive Devices/Equipment Home Assistive Devices/Equipment: Eyeglasses    Abuse/Neglect Assessment (Assessment to be complete while patient is alone) Abuse/Neglect Assessment Can Be Completed: Yes Physical Abuse: Denies Verbal Abuse: Denies Sexual Abuse: Denies Exploitation of patient/patient's resources: Denies Self-Neglect: Denies     Merchant navy officer (For Healthcare) Does Patient Have a Medical Advance Directive?: No Would patient like information on creating a medical advance directive?: No - Patient declined          Disposition: Per Nira Conn, NP pt is recommended for inpt treatment. [facility] to review for possible admission pending labs per [facility]. Pt does not have labs or UDS at this time. TCU nurse advised that labs are needed prior to admitting a pt to [facility]. EDP has been advised Samuel Jester, DO.   Disposition Initial Assessment Completed for this Encounter: Yes Disposition of Patient: Admit(PER Nira Conn, NP) Type of inpatient treatment program: Adult Patient refused recommended treatment: No  On Site Evaluation by:   Reviewed with  Physician:    Karolee Ohs 11/10/2017 11:30 PM

## 2017-11-10 NOTE — Progress Notes (Signed)
Per Nira ConnJason Berry, NP pt is recommended for inpt treatment. [facility] to review for possible admission pending [facility]. Pt does not have labs or UDS at this time. TCU nurse advised that labs are needed prior [facility]. EDP has been advised Samuel JesterMcManus, Kathleen, DO.   Princess BruinsAquicha Alora Gorey, MSW, LCSW Therapeutic Triage Specialist  [phone]

## 2017-11-10 NOTE — ED Notes (Signed)
Bed: WLPT2 Expected date:  Expected time:  Means of arrival:  Comments: 

## 2017-11-10 NOTE — ED Notes (Addendum)
Pt's belongings locked in locker 31. Wife given wedding band to take home. Pt and wife given information sheet about guidelines for the department and explained that he will likely be moved to another area during the night.

## 2017-11-10 NOTE — ED Triage Notes (Addendum)
Pt states "I don't feel like I do anything right in life". Pt reports feeling this way for awhile now. Pt became emotional in triage. Pt report SI with plan to "go to a gun range". Denies HI/AVH.

## 2017-11-11 ENCOUNTER — Encounter (HOSPITAL_COMMUNITY): Payer: Self-pay

## 2017-11-11 ENCOUNTER — Inpatient Hospital Stay (HOSPITAL_COMMUNITY)
Admission: AD | Admit: 2017-11-11 | Discharge: 2017-11-14 | DRG: 885 | Disposition: A | Discharge status: Home / Self Care | Payer: Federal, State, Local not specified - Other | Source: Intra-hospital | Attending: Psychiatry | Admitting: Psychiatry

## 2017-11-11 DIAGNOSIS — F1024 Alcohol dependence with alcohol-induced mood disorder: Secondary | ICD-10-CM | POA: Diagnosis not present

## 2017-11-11 DIAGNOSIS — Z915 Personal history of self-harm: Secondary | ICD-10-CM

## 2017-11-11 DIAGNOSIS — F17210 Nicotine dependence, cigarettes, uncomplicated: Secondary | ICD-10-CM | POA: Diagnosis present

## 2017-11-11 DIAGNOSIS — F314 Bipolar disorder, current episode depressed, severe, without psychotic features: Secondary | ICD-10-CM | POA: Diagnosis present

## 2017-11-11 DIAGNOSIS — Z9852 Vasectomy status: Secondary | ICD-10-CM

## 2017-11-11 DIAGNOSIS — I10 Essential (primary) hypertension: Secondary | ICD-10-CM | POA: Diagnosis present

## 2017-11-11 DIAGNOSIS — F1994 Other psychoactive substance use, unspecified with psychoactive substance-induced mood disorder: Secondary | ICD-10-CM

## 2017-11-11 DIAGNOSIS — F1020 Alcohol dependence, uncomplicated: Secondary | ICD-10-CM | POA: Diagnosis not present

## 2017-11-11 DIAGNOSIS — F10239 Alcohol dependence with withdrawal, unspecified: Secondary | ICD-10-CM | POA: Diagnosis present

## 2017-11-11 DIAGNOSIS — F4310 Post-traumatic stress disorder, unspecified: Secondary | ICD-10-CM | POA: Diagnosis present

## 2017-11-11 DIAGNOSIS — F4010 Social phobia, unspecified: Secondary | ICD-10-CM | POA: Diagnosis present

## 2017-11-11 DIAGNOSIS — E890 Postprocedural hypothyroidism: Secondary | ICD-10-CM | POA: Diagnosis present

## 2017-11-11 DIAGNOSIS — F410 Panic disorder [episodic paroxysmal anxiety] without agoraphobia: Secondary | ICD-10-CM | POA: Diagnosis present

## 2017-11-11 DIAGNOSIS — G4700 Insomnia, unspecified: Secondary | ICD-10-CM | POA: Diagnosis present

## 2017-11-11 DIAGNOSIS — R45851 Suicidal ideations: Secondary | ICD-10-CM | POA: Diagnosis present

## 2017-11-11 DIAGNOSIS — F514 Sleep terrors [night terrors]: Secondary | ICD-10-CM | POA: Diagnosis present

## 2017-11-11 DIAGNOSIS — F429 Obsessive-compulsive disorder, unspecified: Secondary | ICD-10-CM | POA: Diagnosis present

## 2017-11-11 LAB — LIPID PANEL
CHOLESTEROL: 187 mg/dL (ref 0–200)
HDL: 67 mg/dL (ref >40)
LDL Cholesterol: 108 mg/dL — ABNORMAL HIGH (ref 0–99)
TRIGLYCERIDES: 60 mg/dL (ref <150)
Total CHOL/HDL Ratio: 2.8 RATIO
VLDL: 12 mg/dL (ref 0–40)

## 2017-11-11 LAB — SALICYLATE LEVEL: Salicylate Lvl: <7 mg/dL (ref 2.8–30.0)

## 2017-11-11 LAB — TSH: TSH: 2.152 u[IU]/mL (ref 0.350–4.500)

## 2017-11-11 LAB — ETHANOL: Alcohol, Ethyl (B): 159 mg/dL — ABNORMAL HIGH (ref <10)

## 2017-11-11 LAB — HEMOGLOBIN A1C
HEMOGLOBIN A1C: 5.1 % (ref 4.8–5.6)
MEAN PLASMA GLUCOSE: 99.67 mg/dL

## 2017-11-11 LAB — ACETAMINOPHEN LEVEL: Acetaminophen (Tylenol), Serum: <10 ug/mL — ABNORMAL LOW (ref 10–30)

## 2017-11-11 MED ORDER — NICOTINE 21 MG/24HR TD PT24
21.0000 mg | MEDICATED_PATCH | Freq: Every day | TRANSDERMAL | Status: DC
  Administered 2017-11-12 – 2017-11-14 (×3): 21 mg via TRANSDERMAL
  Filled 2017-11-11 (×5): qty 1

## 2017-11-11 MED ORDER — ACETAMINOPHEN 325 MG PO TABS: 650.0000 mg | ORAL_TABLET | Freq: Four times a day (QID) | ORAL | Status: DC | PRN

## 2017-11-11 MED ORDER — ALUM & MAG HYDROXIDE-SIMETH 200-200-20 MG/5ML PO SUSP: 30.0000 mL | ORAL | Status: DC | PRN

## 2017-11-11 MED ORDER — NICOTINE POLACRILEX 2 MG MT GUM
2.0000 mg | CHEWING_GUM | OROMUCOSAL | Status: DC | PRN
  Administered 2017-11-11 (×3): 2 mg via ORAL
  Filled 2017-11-11: qty 1

## 2017-11-11 MED ORDER — CHLORDIAZEPOXIDE HCL 25 MG PO CAPS
25.0000 mg | ORAL_CAPSULE | Freq: Every day | ORAL | Status: AC
  Administered 2017-11-14: 25 mg via ORAL
  Filled 2017-11-11: qty 1

## 2017-11-11 MED ORDER — CHLORDIAZEPOXIDE HCL 25 MG PO CAPS: 25.0000 mg | ORAL_CAPSULE | Freq: Four times a day (QID) | ORAL | Status: AC | PRN

## 2017-11-11 MED ORDER — CHLORDIAZEPOXIDE HCL 25 MG PO CAPS
25.0000 mg | ORAL_CAPSULE | Freq: Three times a day (TID) | ORAL | Status: AC
  Administered 2017-11-12 (×3): 25 mg via ORAL
  Filled 2017-11-11 (×3): qty 1

## 2017-11-11 MED ORDER — MAGNESIUM HYDROXIDE 400 MG/5ML PO SUSP: 30.0000 mL | Freq: Every day | ORAL | Status: DC | PRN

## 2017-11-11 MED ORDER — ONDANSETRON 4 MG PO TBDP: 4.0000 mg | ORAL_TABLET | Freq: Four times a day (QID) | ORAL | Status: AC | PRN

## 2017-11-11 MED ORDER — CHLORDIAZEPOXIDE HCL 25 MG PO CAPS
25.0000 mg | ORAL_CAPSULE | Freq: Four times a day (QID) | ORAL | Status: AC
  Administered 2017-11-11 (×4): 25 mg via ORAL
  Filled 2017-11-11 (×3): qty 1

## 2017-11-11 MED ORDER — TRAZODONE HCL 50 MG PO TABS
50.0000 mg | ORAL_TABLET | Freq: Every evening | ORAL | Status: DC | PRN
  Administered 2017-11-11 – 2017-11-13 (×4): 50 mg via ORAL
  Filled 2017-11-11 (×6): qty 1
  Filled 2017-11-11: qty 14
  Filled 2017-11-11 (×2): qty 1
  Filled 2017-11-11: qty 14
  Filled 2017-11-11: qty 1

## 2017-11-11 MED ORDER — HYDROXYZINE HCL 25 MG PO TABS
25.0000 mg | ORAL_TABLET | Freq: Four times a day (QID) | ORAL | Status: AC | PRN
  Administered 2017-11-11: 25 mg via ORAL
  Filled 2017-11-11 (×4): qty 1

## 2017-11-11 MED ORDER — CHLORDIAZEPOXIDE HCL 25 MG PO CAPS
25.0000 mg | ORAL_CAPSULE | ORAL | Status: AC
  Administered 2017-11-13 (×2): 25 mg via ORAL
  Filled 2017-11-11 (×3): qty 1

## 2017-11-11 MED ORDER — VITAMIN B-1 100 MG PO TABS
100.0000 mg | ORAL_TABLET | Freq: Every day | ORAL | Status: DC
  Administered 2017-11-12 – 2017-11-14 (×3): 100 mg via ORAL
  Filled 2017-11-11 (×5): qty 1

## 2017-11-11 MED ORDER — BUPROPION HCL ER (XL) 150 MG PO TB24
150.0000 mg | ORAL_TABLET | Freq: Every day | ORAL | Status: DC
  Administered 2017-11-11 – 2017-11-14 (×4): 150 mg via ORAL
  Filled 2017-11-11: qty 7
  Filled 2017-11-11 (×6): qty 1

## 2017-11-11 MED ORDER — LOPERAMIDE HCL 2 MG PO CAPS: 2.0000 mg | ORAL_CAPSULE | ORAL | Status: AC | PRN

## 2017-11-11 NOTE — [facility] Group Notes (Signed)
LCSW Group Therapy Note  11/11/2017 1:15pm  Type of Therapy and Topic:  Group Therapy:  Feelings around Relapse and Recovery  Participation Level:  Did Not Attend --pt invited. Chose to remain in room to rest.   Description of Group:    Patients in this group will discuss emotions they experience before and after a relapse. They will process how experiencing these feelings, or avoidance of experiencing them, relates to having a relapse. Facilitator will guide patients to explore emotions they have related to recovery. Patients will be encouraged to process which emotions are more powerful. They will be guided to discuss the emotional reaction significant others in their lives may have to their relapse or recovery. Patients will be assisted in exploring ways to respond to the emotions of others without this contributing to a relapse.  Therapeutic Goals: 1. Patient will identify two or more emotions that lead to a relapse for them 2. Patient will identify two emotions that result when they relapse 3. Patient will identify two emotions related to recovery 4. Patient will demonstrate ability to communicate their needs through discussion and/or role plays   Summary of Patient Progress:   x  Therapeutic Modalities:   Cognitive Behavioral Therapy Solution-Focused Therapy Assertiveness Training Relapse Prevention Therapy   Rona RavensHeather S Curley Fayette, LCSW 11/11/2017 10:39 AM

## 2017-11-11 NOTE — Progress Notes (Signed)
[facility] to Dr. Burman Riislarly, MD. Call to report [phone]. TCU nurse advised of acceptance. Bed is available now. VOL consent for treatment [facility].  Nicholas Melton, MSW, LCSW Therapeutic Triage Specialist  [phone]

## 2017-11-11 NOTE — Progress Notes (Signed)
Admission Note:   Nicholas Melton is a 47 y.o. male admitted VOL for increasing depression, SI ideation with plan to go to gun range and shoot self, and substance-use (alcohol). Pt was able to contact for safety while on the unit. Pt has been admitted to INPT facilities for treatment of SI and depression including [facility] and [facility] in the past. Pt has PMH of HTN. Pt denies HI/AVH/Pain at this time. Pt states he lives at home with wife. Pt states wife is supportive. Pt states he has been drinking heavily for the last three months. Longest sobriety is one year. Pt endorses financial and job stressors. Pt currently works as a Airline pilotwaiter.Pt states he drinks 8 beers and smokes 1/2 pack per day. BAL was 159 on admit. Pt denies current drug-use. Pt denies legal trouble. Pt does not have a current PCP/therapist/psychiatrist. Pt states he went to [facility] in the past.Pt states he takes Propranolol, Gabapentin, Wellbutrin, Abilify,and Vistaril at home.Skin was assessed and found to be clear of any abnormal marks apart from bilateral nipple piercings. Pt searched and no contraband found, POC and unit policies explained and understanding verbalized. Consents obtained. Food and fluids offered, and both accepted. Pt had no additional questions or concerns. Belongings in locker #16.

## 2017-11-11 NOTE — Progress Notes (Signed)
D: Patient denies SI, HI or AVH this morning. Patient presents as flat and depressed but pleasant ad appropriate.  Pt. Was a late admission and states he did not get much rest.  Pt. Denies any withdrawal symptoms this morning and is going to the cafeteria for meals.  Pt. Denies any other physical complaints, he is minimal and forwards little.  A: Patient given emotional support from RN. Patient encouraged to come to staff with concerns and/or questions. Patient's medication routine continued. Patient's orders and plan of care reviewed.   R: Patient remains appropriate and cooperative. Will continue to monitor patient q15 minutes for safety.

## 2017-11-11 NOTE — Tx Team (Signed)
Initial Treatment Plan 11/11/2017 3:04 AM Nicholas Melton ZOX:[id number]    PATIENT STRESSORS: Financial difficulties Substance abuse   PATIENT STRENGTHS: Ability for insight Average or above average intelligence Capable of independent living Communication skills General fund of knowledge Motivation for treatment/growth Physical Health Supportive family/friends Work skills   PATIENT IDENTIFIED PROBLEMS: "At risk for suicide"   "Alcohol-use"   "Depression"   "Finances"                DISCHARGE CRITERIA:  Improved stabilization in mood, thinking, and/or behavior Verbal commitment to aftercare and medication compliance Withdrawal symptoms are absent or subacute and managed without 24-hour nursing intervention  PRELIMINARY DISCHARGE PLAN: Attend PHP/IOP Attend 12-step recovery group Outpatient therapy Return to previous living arrangement Return to previous work or school arrangements  PATIENT/FAMILY INVOLVEMENT: This treatment plan has been presented to and reviewed with the patient, Nicholas Melton. The patient have been given the opportunity to ask questions and make suggestions.  Tyrone AppleEmily  Cortez Flippen, RN 11/11/2017, 3:04 AM

## 2017-11-11 NOTE — H&P (Signed)
Psychiatric Admission Assessment Adult  Patient Identification: Nicholas Melton MRN:  [id number] Date of Evaluation:  11/11/2017 Chief Complaint:  Bipolar 1 depressed ETOH use disorder severe Principal Diagnosis: <principal problem not specified> Diagnosis:   Patient Active Problem List   Diagnosis Date Noted  . Bipolar 1 disorder, depressed, severe (HCC) [F31.4] 11/11/2017  . Acute encephalopathy [G93.40]   . Overdose [T50.901A] 01/22/2016  . Malnutrition of moderate degree [E44.0] 01/22/2016  . Intentional drug overdose (HCC) [T50.902A]   . Acute respiratory failure with hypoxemia (HCC) [J96.01]   . Alcohol intoxication with delirium (HCC) [F10.921]   . Alcohol use disorder, severe, dependence (HCC) [F10.20] 02/14/2015  . Bipolar 2 disorder, major depressive episode (HCC) [F31.81] 02/14/2015  . Alcohol dependence (HCC) [F10.20] 06/17/2011  . MDD (major depressive disorder) [F32.9] 06/15/2011  . Alcohol consumption of more than four drinks per day [Z78.9] 06/15/2011  . PTSD (post-traumatic stress disorder) [F43.10] 06/15/2011    Class: Acute  . Homeless [Z59.0] 06/15/2011   History of Present Illness: Patient is seen and examined.  Patient is a 47 year old male with a past psychiatric history significant for alcohol dependence, reportedly bipolar disorder type II; most recently depressed who presented to the [facility] emergency department yesterday with suicidal ideation.  The patient was brought to the emergency room by his wife.  The patient reported that he was going to go to a gun range, get a gun, and then shoot himself.  The patient stated that he had been sober from alcohol for approximately 1 year, and had been applying for disability.  He was turned down for disability, and had to go back to work.  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  He stated he relapsed after he had to start working again.  He stated he was drinking approximately 7-10  beers a day.  He denied any previous history of seizures, but did have tremors and other symptoms of uncomplicated withdrawal.  He does have a history of suicide attempts, and actually had a hospitalization in 2016 where he required inpatient medical hospitalization because of the alcohol.  Patient has been treated for depression with Wellbutrin.  He is unsure of the dosage.  He admitted to helplessness, hopelessness and worthlessness.  He admitted to suicidal ideation.  He was admitted to the hospital for evaluation and stabilization.  Associated Signs/Symptoms: Depression Symptoms:  depressed mood, anhedonia, insomnia, psychomotor retardation, fatigue, feelings of worthlessness/guilt, difficulty concentrating, hopelessness, suicidal thoughts without plan, anxiety, loss of energy/fatigue, disturbed sleep, (Hypo) Manic Symptoms:  Impulsivity, Irritable Mood, Anxiety Symptoms:  Excessive Worry, Psychotic Symptoms:  Denied PTSD Symptoms: Negative Total Time spent with patient: 30 minutes  Past Psychiatric History: Patient's last psychiatric mission to our facility was on 02/14/2015.  Diagnosis at that time was substance-induced mood disorder, alcohol dependence.  He also had a hospitalization in November 2017 with acute respiratory failure in which alcohol was significantly involved.  Is the patient at risk to self? Yes.    Has the patient been a risk to self in the past 6 months? Yes.    Has the patient been a risk to self within the distant past? Yes.    Is the patient a risk to others? No.  Has the patient been a risk to others in the past 6 months? No.  Has the patient been a risk to others within the distant past? No.   Prior Inpatient Therapy:   Prior Outpatient Therapy:  Alcohol Screening: 1. How often do you have a drink containing alcohol?: 4 or more times a week 2. How many drinks containing alcohol do you have on a typical day when you are drinking?: 1 or 2 3. How often  do you have six or more drinks on one occasion?: Never AUDIT-C Score: 4 4. How often during the last year have you found that you were not able to stop drinking once you had started?: Never 5. How often during the last year have you failed to do what was normally expected from you becasue of drinking?: Never 6. How often during the last year have you needed a first drink in the morning to get yourself going after a heavy drinking session?: Never 7. How often during the last year have you had a feeling of guilt of remorse after drinking?: Less than monthly 8. How often during the last year have you been unable to remember what happened the night before because you had been drinking?: Never 9. Have you or someone else been injured as a result of your drinking?: No 10. Has a relative or friend or a doctor or another health worker been concerned about your drinking or suggested you cut down?: No Alcohol Use Disorder Identification Test Final Score (AUDIT): 5 Substance Abuse History in the last 12 months:  Yes.   Consequences of Substance Abuse: Medical Consequences:  Previous medical hospitalization secondary to respiratory failure in which alcohol played a role. Family Consequences:  Loss of employment, difficulties at home because of alcohol related issues Withdrawal Symptoms:   Diarrhea Headaches Nausea Tremors Vomiting Previous Psychotropic Medications: Yes  Psychological Evaluations: Yes  Past Medical History:  Past Medical History:  Diagnosis Date  . Anxiety   . Bipolar 2 disorder (HCC)   . Depression   . Hypertension   . Night terrors, adult   . OCD (obsessive compulsive disorder)   . Panic attacks   . Posttraumatic stress disorder   . Social phobia     Past Surgical History:  Procedure Laterality Date  . THYROIDECTOMY    . VASECTOMY     Family History:  Family History  Problem Relation Age of Onset  . Cancer Brother    Family Psychiatric  History: He has been diagnosed  with substance-induced mood disorder, bipolar disorder, bipolar depression as well as major depression. Tobacco Screening: Have you used any form of tobacco in the last 30 days? (Cigarettes, Smokeless Tobacco, Cigars, and/or Pipes): Yes Tobacco use, Select all that apply: 5 or more cigarettes per day Are you interested in Tobacco Cessation Medications?: Yes, will notify MD for an order Counseled patient on smoking cessation including recognizing danger situations, developing coping skills and basic information about quitting provided: Yes Social History:  Social History   Substance and Sexual Activity  Alcohol Use Yes  . Alcohol/week: 112.0 standard drinks  . Types: 112 Cans of beer per week     Social History   Substance and Sexual Activity  Drug Use Yes  . Types: Cocaine   Comment: reports uses appr. once a year    Additional Social History:                           Allergies:   Allergies  Allergen Reactions  . Risperidone And Related Other (See Comments)    Faint, dizzy, lethargic   . Seroquel [Quetiapine] Other (See Comments)    Faint, dizzy, lethargic   . Depakote  Er [Divalproex Sodium Er] Diarrhea  . Ambien [Zolpidem Tartrate] Other (See Comments)    Hallucinations   . Lamictal [Lamotrigine] Nausea And Vomiting   Lab Results:  Results for orders placed or performed during the hospital encounter of 11/11/17 (from the past 48 hour(s))  Hemoglobin A1c     Status: None   Collection Time: 11/11/17  6:30 AM  Result Value Ref Range   Hgb A1c MFr Bld 5.1 4.8 - 5.6 %    Comment: (NOTE) Pre diabetes:          5.7%-6.4% Diabetes:              >6.4% Glycemic control for   <7.0% adults with diabetes    Mean Plasma Glucose 99.67 mg/dL    Comment: Performed at [facility], [address] [place], [place] [address]  Lipid panel     Status: Abnormal   Collection Time: 11/11/17  6:30 AM  Result Value Ref Range   Cholesterol 187 0 - 200 mg/dL    Triglycerides 60 <604 mg/dL   HDL 67 >54 mg/dL   Total CHOL/HDL Ratio 2.8 RATIO   VLDL 12 0 - 40 mg/dL   LDL Cholesterol 098 (H) 0 - 99 mg/dL    Comment:        Total Cholesterol/HDL:CHD Risk Coronary Heart Disease Risk Table                     Men   Women  1/2 Average Risk   3.4   3.3  Average Risk       5.0   4.4  2 X Average Risk   9.6   7.1  3 X Average Risk  23.4   11.0        Use the calculated Patient Ratio above and the CHD Risk Table to determine the patient's CHD Risk.        ATP III CLASSIFICATION (LDL):  <100     mg/dL   Optimal  119-147  mg/dL   Near or Above                    Optimal  130-159  mg/dL   Borderline  829-562  mg/dL   High  >130     mg/dL   Very High Performed at [facility], [address] [place], [place] [address]   TSH     Status: None   Collection Time: 11/11/17  6:30 AM  Result Value Ref Range   TSH 2.152 0.350 - 4.500 uIU/mL    Comment: Performed by a 3rd Generation assay with a functional sensitivity of <=0.01 uIU/mL. Performed at [facility], [address] [place], [place] [address]     Blood Alcohol level:  Lab Results  Component Value Date   ETH 159 (H) 11/10/2017   ETH 261 (H) 01/22/2016    Metabolic Disorder Labs:  Lab Results  Component Value Date   HGBA1C 5.1 11/11/2017   MPG 99.67 11/11/2017   No results found for: PROLACTIN Lab Results  Component Value Date   CHOL 187 11/11/2017   TRIG 60 11/11/2017   HDL 67 11/11/2017   CHOLHDL 2.8 11/11/2017   VLDL 12 11/11/2017   LDLCALC 108 (H) 11/11/2017   LDLCALC 68 06/15/2011    Current Medications: Current Facility-Administered Medications  Medication Dose Route Frequency Provider Last Rate Last Dose  . acetaminophen (TYLENOL) tablet 650 mg  650 mg Oral  Q6H PRN Jackelyn Poling, NP      . alum & mag hydroxide-simeth (MAALOX/MYLANTA) 200-200-20 MG/5ML suspension 30 mL  30 mL Oral Q4H PRN Nira Conn A, NP      . buPROPion  (WELLBUTRIN XL) 24 hr tablet 150 mg  150 mg Oral Daily Antonieta Pert, MD   150 mg at 11/11/17 1029  . chlordiazePOXIDE (LIBRIUM) capsule 25 mg  25 mg Oral Q6H PRN Nira Conn A, NP      . chlordiazePOXIDE (LIBRIUM) capsule 25 mg  25 mg Oral QID Nira Conn A, NP   25 mg at 11/11/17 0818   Followed by  . [START ON 11/12/2017] chlordiazePOXIDE (LIBRIUM) capsule 25 mg  25 mg Oral TID Jackelyn Poling, NP       Followed by  . [START ON 11/13/2017] chlordiazePOXIDE (LIBRIUM) capsule 25 mg  25 mg Oral BH-qamhs Jackelyn Poling, NP       Followed by  . [START ON 11/14/2017] chlordiazePOXIDE (LIBRIUM) capsule 25 mg  25 mg Oral Daily Nira Conn A, NP      . hydrOXYzine (ATARAX/VISTARIL) tablet 25 mg  25 mg Oral Q6H PRN Nira Conn A, NP      . loperamide (IMODIUM) capsule 2-4 mg  2-4 mg Oral PRN Nira Conn A, NP      . magnesium hydroxide (MILK OF MAGNESIA) suspension 30 mL  30 mL Oral Daily PRN Nira Conn A, NP      . nicotine polacrilex (NICORETTE) gum 2 mg  2 mg Oral PRN Nira Conn A, NP      . ondansetron (ZOFRAN-ODT) disintegrating tablet 4 mg  4 mg Oral Q6H PRN Jackelyn Poling, NP      . Melene Muller ON 11/12/2017] thiamine (VITAMIN B-1) tablet 100 mg  100 mg Oral Daily Nira Conn A, NP       PTA Medications: Medications Prior to Admission  Medication Sig Dispense Refill Last Dose  . ARIPiprazole (ABILIFY) 15 MG tablet Take 15 mg by mouth daily.   11/10/2017 at Unknown time  . buPROPion (WELLBUTRIN XL) 150 MG 24 hr tablet Take 150 mg by mouth daily.   11/10/2017 at Unknown time  . gabapentin (NEURONTIN) 400 MG capsule Take 400 mg by mouth 2 (two) times daily.    11/10/2017 at Unknown time  . hydrOXYzine (ATARAX/VISTARIL) 25 MG tablet Take 25-50 mg by mouth every 6 (six) hours as needed for anxiety.    11/10/2017 at Unknown time  . lithium carbonate 150 MG capsule Take 150 mg by mouth 3 (three) times daily with meals.     . propranolol (INDERAL) 10 MG tablet Take 10 mg by mouth daily.   11/10/2017 at  Unknown time    Musculoskeletal: Strength & Muscle Tone: within normal limits Gait & Station: normal Patient leans: N/A  Psychiatric Specialty Exam: Physical Exam  Nursing note and vitals reviewed. Constitutional: He is oriented to person, place, and time. He appears well-developed and well-nourished.  HENT:  Head: Normocephalic and atraumatic.  Respiratory: Effort normal.  Neurological: He is alert and oriented to person, place, and time.    ROS  Blood pressure 108/64, pulse 82, temperature 98.5 F (36.9 C), temperature source Oral, resp. rate 18, height 6' (1.829 m), weight 70.3 kg.Body mass index is 21.02 kg/m.  General Appearance: Disheveled  Eye Contact:  Poor  Speech:  Slow  Volume:  Decreased  Mood:  Depressed  Affect:  Congruent  Thought Process:  Coherent and Descriptions of  Associations: Circumstantial  Orientation:  Full (Time, Place, and Person)  Thought Content:  Logical  Suicidal Thoughts:  Yes.  without intent/plan  Homicidal Thoughts:  No  Memory:  Immediate;   Fair Recent;   Fair Remote;   Fair  Judgement:  Impaired  Insight:  Lacking  Psychomotor Activity:  Decreased and Psychomotor Retardation  Concentration:  Concentration: Fair and Attention Span: Fair  Recall:  [employer] of Knowledge:  Fair  Language:  Fair  Akathisia:  Negative  Handed:  Right  AIMS (if indicated):     Assets:  Desire for Improvement Housing Resilience Social Support  ADL's:  Intact  Cognition:  WNL  Sleep:  Number of Hours: 1.75    Treatment Plan Summary: Daily contact with patient to assess and evaluate symptoms and progress in treatment, Medication management and Plan Patient is seen and examined.  Patient is a 47 year old male with a past psychiatric history significant for alcohol dependence and depression.  He was admitted secondary to suicidal ideation.  He will be admitted to the inpatient unit.  He will be placed on a Librium detox protocol.  His Wellbutrin will  be continued.  We will get collateral information from his wife.  He will be placed on 15-minute checks for safety as well as detox and withdrawal symptoms.  He will be encouraged to attend groups.  He will be encouraged to work on his coping skills.  Observation Level/Precautions:  Detox 15 minute checks  Laboratory:  Chemistry Profile  Psychotherapy:    Medications:    Consultations:    Discharge Concerns:    Estimated LOS:  Other:     Physician Treatment Plan for Primary Diagnosis: <principal problem not specified> Long Term Goal(s): Improvement in symptoms so as ready for discharge  Short Term Goals: Ability to identify changes in lifestyle to reduce recurrence of condition will improve, Ability to verbalize feelings will improve, Ability to disclose and discuss suicidal ideas, Ability to demonstrate self-control will improve, Ability to identify and develop effective coping behaviors will improve, Ability to maintain clinical measurements within normal limits will improve and Ability to identify triggers associated with substance abuse/mental health issues will improve  Physician Treatment Plan for Secondary Diagnosis: Active Problems:   Bipolar 1 disorder, depressed, severe (HCC)  Long Term Goal(s): Improvement in symptoms so as ready for discharge  Short Term Goals: Ability to identify changes in lifestyle to reduce recurrence of condition will improve, Ability to verbalize feelings will improve, Ability to disclose and discuss suicidal ideas, Ability to demonstrate self-control will improve, Ability to identify and develop effective coping behaviors will improve, Ability to maintain clinical measurements within normal limits will improve and Ability to identify triggers associated with substance abuse/mental health issues will improve  I certify that inpatient services furnished can reasonably be expected to improve the patient's condition.    Antonieta Pert, MD 8/30/201910:50  AM
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
## 2017-11-11 NOTE — ED Notes (Signed)
Wife, Maralyn SagoSarah, updated with pt's room number and status.

## 2017-11-11 NOTE — Progress Notes (Signed)
Patient did attend the evening speaker AA meeting.  

## 2017-11-11 NOTE — Tx Team (Signed)
Interdisciplinary Treatment and Diagnostic Plan Update  11/11/2017 Time of Session: 0830AM Nicholas Melton MRN: [id number]  Principal Diagnosis: Bipolar Disorder  Secondary Diagnoses: Active Problems:   Bipolar 1 disorder, depressed, severe (HCC)   Current Medications:  Current Facility-Administered Medications  Medication Dose Route Frequency Provider Last Rate Last Dose  . acetaminophen (TYLENOL) tablet 650 mg  650 mg Oral Q6H PRN Rozetta Nunnery, NP      . alum & mag hydroxide-simeth (MAALOX/MYLANTA) 200-200-20 MG/5ML suspension 30 mL  30 mL Oral Q4H PRN Lindon Romp A, NP      . buPROPion (WELLBUTRIN XL) 24 hr tablet 150 mg  150 mg Oral Daily Sharma Covert, MD      . chlordiazePOXIDE (LIBRIUM) capsule 25 mg  25 mg Oral Q6H PRN Lindon Romp A, NP      . chlordiazePOXIDE (LIBRIUM) capsule 25 mg  25 mg Oral QID Lindon Romp A, NP   25 mg at 11/11/17 0818   Followed by  . [START ON 11/12/2017] chlordiazePOXIDE (LIBRIUM) capsule 25 mg  25 mg Oral TID Rozetta Nunnery, NP       Followed by  . [START ON 11/13/2017] chlordiazePOXIDE (LIBRIUM) capsule 25 mg  25 mg Oral BH-qamhs Rozetta Nunnery, NP       Followed by  . [START ON 11/14/2017] chlordiazePOXIDE (LIBRIUM) capsule 25 mg  25 mg Oral Daily Lindon Romp A, NP      . hydrOXYzine (ATARAX/VISTARIL) tablet 25 mg  25 mg Oral Q6H PRN Lindon Romp A, NP      . loperamide (IMODIUM) capsule 2-4 mg  2-4 mg Oral PRN Lindon Romp A, NP      . magnesium hydroxide (MILK OF MAGNESIA) suspension 30 mL  30 mL Oral Daily PRN Lindon Romp A, NP      . nicotine polacrilex (NICORETTE) gum 2 mg  2 mg Oral PRN Lindon Romp A, NP      . ondansetron (ZOFRAN-ODT) disintegrating tablet 4 mg  4 mg Oral Q6H PRN Rozetta Nunnery, NP      . Derrill Memo ON 11/12/2017] thiamine (VITAMIN B-1) tablet 100 mg  100 mg Oral Daily Lindon Romp A, NP       PTA Medications: Medications Prior to Admission  Medication Sig Dispense Refill Last Dose  . ARIPiprazole (ABILIFY) 15 MG tablet  Take 15 mg by mouth daily.   11/10/2017 at Unknown time  . buPROPion (WELLBUTRIN XL) 150 MG 24 hr tablet Take 150 mg by mouth daily.   11/10/2017 at Unknown time  . gabapentin (NEURONTIN) 400 MG capsule Take 400 mg by mouth 2 (two) times daily.    11/10/2017 at Unknown time  . hydrOXYzine (ATARAX/VISTARIL) 25 MG tablet Take 25-50 mg by mouth every 6 (six) hours as needed for anxiety.    11/10/2017 at Unknown time  . lithium carbonate 150 MG capsule Take 150 mg by mouth 3 (three) times daily with meals.     . propranolol (INDERAL) 10 MG tablet Take 10 mg by mouth daily.   11/10/2017 at Unknown time    Patient Stressors: Financial difficulties Substance abuse  Patient Strengths: Ability for insight Average or above average intelligence Capable of independent living Communication skills General fund of knowledge Motivation for treatment/growth Physical Health Supportive family/friends Work skills  Treatment Modalities: Medication Management, Group therapy, Case management,  1 to 1 session with clinician, Psychoeducation, Recreational therapy.   Physician Treatment Plan for Primary Diagnosis: Bipolar Disorder  Medication Management: Evaluate patient's response,  side effects, and tolerance of medication regimen.  Therapeutic Interventions: 1 to 1 sessions, Unit Group sessions and Medication administration.  Evaluation of Outcomes: Not Met  Physician Treatment Plan for Secondary Diagnosis: Active Problems:   Bipolar 1 disorder, depressed, severe (Nicholas Melton)   Medication Management: Evaluate patient's response, side effects, and tolerance of medication regimen.  Therapeutic Interventions: 1 to 1 sessions, Unit Group sessions and Medication administration.  Evaluation of Outcomes: Not Met   RN Treatment Plan for Primary Diagnosis: Bipolar Disorder Long Term Goal(s): Knowledge of disease and therapeutic regimen to maintain health will improve  Short Term Goals: Ability to remain free from  injury will improve, Ability to verbalize frustration and anger appropriately will improve, Ability to verbalize feelings will improve and Ability to identify and develop effective coping behaviors will improve  Medication Management: RN will administer medications as ordered by provider, will assess and evaluate patient's response and provide education to patient for prescribed medication. RN will report any adverse and/or side effects to prescribing provider.  Therapeutic Interventions: 1 on 1 counseling sessions, Psychoeducation, Medication administration, Evaluate responses to treatment, Monitor vital signs and CBGs as ordered, Perform/monitor CIWA, COWS, AIMS and Fall Risk screenings as ordered, Perform wound care treatments as ordered.  Evaluation of Outcomes: Not Met   LCSW Treatment Plan for Primary Diagnosis: Bipolar Disorder Long Term Goal(s): Safe transition to appropriate next level of care at discharge, Engage patient in therapeutic group addressing interpersonal concerns.  Short Term Goals: Engage patient in aftercare planning with referrals and resources, Facilitate patient progression through stages of change regarding substance use diagnoses and concerns and Identify triggers associated with mental health/substance abuse issues  Therapeutic Interventions: Assess for all discharge needs, 1 to 1 time with Social worker, Explore available resources and support systems, Assess for adequacy in community support network, Educate family and significant other(s) on suicide prevention, Complete Psychosocial Assessment, Interpersonal group therapy.  Evaluation of Outcomes: Not Met   Progress in Treatment: Attending groups: No. New to unit. Continuing to assess.  Participating in groups: No. Taking medication as prescribed: Yes. Toleration medication: Yes. Family/Significant other contact made: No, will contact:  family member/ wife if pt consents to collateral contact.  Patient  understands diagnosis: Yes. Discussing patient identified problems/goals with staff: Yes. Medical problems stabilized or resolved: Yes. Denies suicidal/homicidal ideation: Yes. Issues/concerns per patient self-inventory: No. Other: n/a   New problem(s) identified: No, Describe:  n/a  New Short Term/Long Term Goal(s): detox, medication management for mood stabilization; elimination of SI thoughts; development of comprehensive mental wellness/sobriety plan.   Patient Goals:  "To get help for suicidal thoughts and my alcohol use."   Discharge Plan or Barriers: CSW assessing for appropriate referrals. [place] pamphlet, [facility] Crisis information, and AA/NA information provided to patient for additional community support and resources.   Reason for Continuation of Hospitalization: Anxiety Depression Medication stabilization Suicidal ideation Withdrawal symptoms  Estimated Length of Stay: Monday, 11/14/17  Attendees: Patient: 11/11/2017 8:35 AM  Physician: Dr. Mallie Darting MD; Dr. Nancy Fetter MD 11/11/2017 8:35 AM  Nursing: Doroteo Bradford RN; Legrand Como RN 11/11/2017 8:35 AM  RN Care Manager:x 11/11/2017 8:35 AM  Social Worker: Janice Norrie LCSW 11/11/2017 8:35 AM  Recreational Therapist: x 11/11/2017 8:35 AM  Other: Lindell Spar NP; Saul Fordyce NP 11/11/2017 8:35 AM  Other:  11/11/2017 8:35 AM  Other: 11/11/2017 8:35 AM    Scribe for Treatment Team: Avelina Laine, LCSW 11/11/2017 8:35 AM

## 2017-11-11 NOTE — [facility] Suicide Risk Assessment (Signed)
[facility]   Nursing information obtained from:    Demographic factors:  Male, Caucasian Current Mental Status:  Suicide plan, Suicidal ideation indicated by patient Loss Factors:  Financial problems / change in socioeconomic status Historical Factors:  Prior suicide attempts Risk Reduction Factors:  Employed, Living with another person, especially a relative, Positive social support  Total Time spent with patient: 30 minutes Principal Problem: <principal problem not specified> Diagnosis:   Patient Active Problem List   Diagnosis Date Noted  . Bipolar 1 disorder, depressed, severe (HCC) [F31.4] 11/11/2017  . Acute encephalopathy [G93.40]   . Overdose [T50.901A] 01/22/2016  . Malnutrition of moderate degree [E44.0] 01/22/2016  . Intentional drug overdose (HCC) [T50.902A]   . Acute respiratory failure with hypoxemia (HCC) [J96.01]   . Alcohol intoxication with delirium (HCC) [F10.921]   . Alcohol use disorder, severe, dependence (HCC) [F10.20] 02/14/2015  . Bipolar 2 disorder, major depressive episode (HCC) [F31.81] 02/14/2015  . Alcohol dependence (HCC) [F10.20] 06/17/2011  . MDD (major depressive disorder) [F32.9] 06/15/2011  . Alcohol consumption of more than four drinks per day [Z78.9] 06/15/2011  . PTSD (post-traumatic stress disorder) [F43.10] 06/15/2011    Class: Acute  . Homeless [Z59.0] 06/15/2011   Subjective Data: Patient is seen and examined.  Patient is a 47 year old male with a past psychiatric history significant for alcohol dependence, reportedly bipolar disorder type II; most recently depressed who [facility] with suicidal ideation.  The patient was brought to the emergency room by his wife.  The patient reported that he was going to go to a gun range, get a gun, and then shoot himself.  The patient stated that he had been sober from alcohol for approximately 1 year, and had  been applying for disability.  He was turned down for disability, and had to go back to work.  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  He stated he relapsed after he had to start working again.  He stated he was drinking approximately 7-10 beers a day.  He denied any previous history of seizures, but did have tremors and other symptoms of uncomplicated withdrawal.  He does have a history of suicide attempts, and actually had a hospitalization in 2016 where he required inpatient medical hospitalization because of the alcohol.  Patient has been treated for depression with Wellbutrin.  He is unsure of the dosage.  He admitted to helplessness, hopelessness and worthlessness.  He admitted to suicidal ideation.  He was admitted to the hospital for evaluation and stabilization.  Continued Clinical Symptoms:  Alcohol Use Disorder Identification Test Final Score (AUDIT): 5 The "Alcohol Use Disorders Identification Test", Guidelines for Use in Primary Care, Second Edition.  World Science writerHealth Organization [facility](WHO). Score between 0-7:  no or low risk or alcohol related problems. Score between 8-15:  moderate risk of alcohol related problems. Score between 16-19:  high risk of alcohol related problems. Score 20 or above:  warrants further diagnostic evaluation for alcohol dependence and treatment.   CLINICAL FACTORS:   Bipolar Disorder:   Depressive phase Depression:   Anhedonia Comorbid alcohol abuse/dependence Hopelessness Impulsivity Insomnia Alcohol/Substance Abuse/Dependencies   Musculoskeletal: Strength & Muscle Tone: within normal limits Gait & Station: normal Patient leans: N/A  Psychiatric Specialty Exam: Physical Exam  Nursing note and vitals reviewed. Constitutional: He is oriented to person, place, and time. He appears well-developed and well-nourished.  HENT:  Head: Normocephalic and atraumatic.  Respiratory: Effort normal.  Neurological: He is alert and oriented to person,  place, and time.    ROS  Blood pressure 108/64, pulse 82, temperature 98.5 F (36.9 C), temperature source Oral, resp. rate 18, height 6' (1.829 m), weight 70.3 kg.Body mass index is 21.02 kg/m.  General Appearance: Disheveled  Eye Contact:  Poor  Speech:  Slow  Volume:  Decreased  Mood:  Depressed  Affect:  Congruent  Thought Process:  Coherent and Descriptions of Associations: Intact  Orientation:  Full (Time, Place, and Person)  Thought Content:  Logical  Suicidal Thoughts:  Yes.  without intent/plan  Homicidal Thoughts:  No  Memory:  Immediate;   Fair Recent;   Fair Remote;   Fair  Judgement:  Impaired  Insight:  Lacking  Psychomotor Activity:  Decreased and Psychomotor Retardation  Concentration:  Concentration: Fair and Attention Span: Fair  Recall:  [employer] of Knowledge:  Fair  Language:  Fair  Akathisia:  Negative  Handed:  Right  AIMS (if indicated):     Assets:  Desire for Improvement Housing Intimacy Resilience Social Support  ADL's:  Intact  Cognition:  WNL  Sleep:  Number of Hours: 1.75      COGNITIVE FEATURES THAT CONTRIBUTE TO RISK:  None    SUICIDE RISK:   Moderate:  Frequent suicidal ideation with limited intensity, and duration, some specificity in terms of plans, no associated intent, good self-control, limited dysphoria/symptomatology, some risk factors present, and identifiable protective factors, including available and accessible social support.  PLAN OF CARE: Patient is seen and examined.  Patient is a 47 year old male with a past psychiatric history significant for alcohol dependence and depression.  He was admitted secondary to suicidal ideation.  He will be admitted to the inpatient unit.  He will be placed on a Librium detox protocol.  His Wellbutrin will be continued.  We will get collateral information from his wife.  He will be placed on 15-minute checks for safety as well as detox and withdrawal symptoms.  He will be encouraged to  attend groups.  He will be encouraged to work on his coping skills.  I certify that inpatient services furnished can reasonably be expected to improve the patient's condition.   Antonieta Pert, MD 11/11/2017, 8:02 AM

## 2017-11-12 DIAGNOSIS — F1020 Alcohol dependence, uncomplicated: Secondary | ICD-10-CM

## 2017-11-12 DIAGNOSIS — F4310 Post-traumatic stress disorder, unspecified: Secondary | ICD-10-CM

## 2017-11-12 LAB — PROLACTIN: Prolactin: 1.9 ng/mL — ABNORMAL LOW (ref 4.0–15.2)

## 2017-11-12 NOTE — Plan of Care (Signed)
  Problem: Education: Goal: Understanding of discharge needs will improve Outcome: Progressing-He reported that he is hoping for treatment when he leaves here and is glad that his wife is so supportive when "I go home."

## 2017-11-12 NOTE — [facility] Group Notes (Signed)
[facility] Group Notes:  Nursing Psychoeducational Group  Date:  11/12/2017  Time:  1:00 PM  Group: Life Skills  Facilitator: Patty D. RN  Type of Therapy:  Psychoeducational Skills  Participation Level:  Active  Participation Quality:  Appropriate  Affect:  Appropriate  Cognitive:  Alert and Oriented  Insight:  Improving  Engagement in Group:  Developing/Improving  Modes of Intervention:  Discussion and Education  Jeweline Reif A Zackerie Sara 11/12/2017, 2:00 PM 

## 2017-11-12 NOTE — Progress Notes (Addendum)
[facility] MD Progress Note  11/12/2017 12:23 PM Nicholas Melton  MRN:  [id number] Subjective:  Patient reports, "I'm doing better but still having some suicidal ideations.  Sleep and appetite are fair."  Some tremors from alcohol withdrawal but minimal.  Depression is a 5/10 and anxiety 3/10,  Mood is improving.  47 year old male with a past psychiatric history significant for alcohol dependence, reportedly bipolar disorder type II; most recently depressed who presented to the [facility] emergency department yesterday with suicidal ideation. The patient was brought to the emergency room by his wife. The patient reported that he was going to go to a gun range, get a gun, and then shoot himself. The patient stated that he had been sober from alcohol for approximately 1 year, and had been applying for disability. He was turned down for disability, and had to go back to work. 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 He stated he relapsed after he had to start working again. He stated he was drinking approximately 7-10 beers a day. He denied any previous history of seizures, but did have tremors and other symptoms of uncomplicated withdrawal. He does have a history of suicide attempts, and actually had a hospitalization in 2016 where he required inpatient medical hospitalization because of the alcohol. Patient has been treated for depression with Wellbutrin. He is unsure of the dosage. He admitted to helplessness, hopelessness and worthlessness. He admitted to suicidal ideation.  Dammon is seen in his room as he is lying on his bed resting, chart reviewed. The chart findings discussed with the treatment team. Today, he presents alert &oriented x 4. He is visible on the unit attending group sessions. He denies any adverse effects. Denies any SIHI or AVH & no psychosis observed. He is complaint with his medications and today denies any intolerances or medication related  side effects. Jru' wife is supportive and visits/calls him.  Unsure of what he wants at discharge at this time.  Principal Problem: Bipolar 1 disorder, depressed, severe (HCC) Diagnosis:   Patient Active Problem List   Diagnosis Date Noted  . Bipolar 1 disorder, depressed, severe (HCC) [F31.4] 11/11/2017    Priority: High  . Alcohol dependence (HCC) [F10.20] 06/17/2011    Priority: High  . PTSD (post-traumatic stress disorder) [F43.10] 06/15/2011    Priority: High    Class: Acute  . Substance induced mood disorder (HCC) [F19.94]   . Acute encephalopathy [G93.40]   . Overdose [T50.901A] 01/22/2016  . Malnutrition of moderate degree [E44.0] 01/22/2016  . Intentional drug overdose (HCC) [T50.902A]   . Acute respiratory failure with hypoxemia (HCC) [J96.01]   . Alcohol intoxication with delirium (HCC) [F10.921]   . Alcohol use disorder, severe, dependence (HCC) [F10.20] 02/14/2015  . Bipolar 2 disorder, major depressive episode (HCC) [F31.81] 02/14/2015  . Alcohol consumption of more than four drinks per day [Z78.9] 06/15/2011  . Homeless [Z59.0] 06/15/2011   Total Time spent with patient: 15 minutes  Past Psychiatric History: alcohol abuse, bipolar d/o, PTSD  Past Medical History:  Past Medical History:  Diagnosis Date  . Anxiety   . Bipolar 2 disorder (HCC)   . Depression   . Hypertension   . Night terrors, adult   . OCD (obsessive compulsive disorder)   . Panic attacks   . Posttraumatic stress disorder   . Social phobia     Past Surgical History:  Procedure Laterality Date  . THYROIDECTOMY    . VASECTOMY  Family History:  Family History  Problem Relation Age of Onset  . Cancer Brother    Family Psychiatric  History: none Social History:  Social History   Substance and Sexual Activity  Alcohol Use Yes  . Alcohol/week: 112.0 standard drinks  . Types: 112 Cans of beer per week     Social History   Substance and Sexual Activity  Drug Use Yes  . Types:  Cocaine   Comment: reports uses appr. once a year    Social History   Socioeconomic History  . Marital status: Single    Spouse name: Not on file  . Number of children: Not on file  . Years of education: Not on file  . Highest education level: Not on file  Occupational History  . Not on file  Social Needs  . Financial resource strain: Not on file  . Food insecurity:    Worry: Not on file    Inability: Not on file  . Transportation needs:    Medical: Not on file    Non-medical: Not on file  Tobacco Use  . Smoking status: Current Every Day Smoker    Packs/day: 2.00    Years: 15.00    Pack years: 30.00    Types: Cigarettes  . Smokeless tobacco: Former Engineer, waterUser  Substance and Sexual Activity  . Alcohol use: Yes    Alcohol/week: 112.0 standard drinks    Types: 112 Cans of beer per week  . Drug use: Yes    Types: Cocaine    Comment: reports uses appr. once a year  . Sexual activity: Yes  Lifestyle  . Physical activity:    Days per week: Not on file    Minutes per session: Not on file  . Stress: Not on file  Relationships  . Social connections:    Talks on phone: Not on file    Gets together: Not on file    Attends religious service: Not on file    Active member of club or organization: Not on file    Attends meetings of clubs or organizations: Not on file    Relationship status: Not on file  Other Topics Concern  . Not on file  Social History Narrative  . Not on file   Additional Social History:   Sleep: Fair  Appetite:  Fair  Current Medications: Current Facility-Administered Medications  Medication Dose Route Frequency Provider Last Rate Last Dose  . acetaminophen (TYLENOL) tablet 650 mg  650 mg Oral Q6H PRN Nira ConnBerry, Jason A, NP      . alum & mag hydroxide-simeth (MAALOX/MYLANTA) 200-200-20 MG/5ML suspension 30 mL  30 mL Oral Q4H PRN Nira ConnBerry, Jason A, NP      . buPROPion (WELLBUTRIN XL) 24 hr tablet 150 mg  150 mg Oral Daily Antonieta Pertlary, Greg Lawson, MD   150 mg at  11/12/17 0754  . chlordiazePOXIDE (LIBRIUM) capsule 25 mg  25 mg Oral Q6H PRN Nira ConnBerry, Jason A, NP      . chlordiazePOXIDE (LIBRIUM) capsule 25 mg  25 mg Oral TID Nira ConnBerry, Jason A, NP   25 mg at 11/12/17 1159   Followed by  . [START ON 11/13/2017] chlordiazePOXIDE (LIBRIUM) capsule 25 mg  25 mg Oral BH-qamhs Jackelyn PolingBerry, Jason A, NP       Followed by  . [START ON 11/14/2017] chlordiazePOXIDE (LIBRIUM) capsule 25 mg  25 mg Oral Daily Nira ConnBerry, Jason A, NP      . hydrOXYzine (ATARAX/VISTARIL) tablet 25 mg  25 mg Oral Q6H PRN  Jackelyn Poling, NP   25 mg at 11/11/17 2138  . loperamide (IMODIUM) capsule 2-4 mg  2-4 mg Oral PRN Nira Conn A, NP      . magnesium hydroxide (MILK OF MAGNESIA) suspension 30 mL  30 mL Oral Daily PRN Nira Conn A, NP      . nicotine (NICODERM CQ - dosed in mg/24 hours) patch 21 mg  21 mg Transdermal Daily Donell Sievert E, PA-C   21 mg at 11/12/17 0755  . ondansetron (ZOFRAN-ODT) disintegrating tablet 4 mg  4 mg Oral Q6H PRN Nira Conn A, NP      . thiamine (VITAMIN B-1) tablet 100 mg  100 mg Oral Daily Nira Conn A, NP   100 mg at 11/12/17 0754  . traZODone (DESYREL) tablet 50 mg  50 mg Oral QHS,MR X 1 Kerry Hough, PA-C   50 mg at 11/11/17 2138    Lab Results:  Results for orders placed or performed during the hospital encounter of 11/11/17 (from the past 48 hour(s))  Hemoglobin A1c     Status: None   Collection Time: 11/11/17  6:30 AM  Result Value Ref Range   Hgb A1c MFr Bld 5.1 4.8 - 5.6 %    Comment: (NOTE) Pre diabetes:          5.7%-6.4% Diabetes:              >6.4% Glycemic control for   <7.0% adults with diabetes    Mean Plasma Glucose 99.67 mg/dL    Comment: Performed at [facility], [address] [place], [place] [address]  Lipid panel     Status: Abnormal   Collection Time: 11/11/17  6:30 AM  Result Value Ref Range   Cholesterol 187 0 - 200 mg/dL   Triglycerides 60 <409 mg/dL   HDL 67 >81 mg/dL   Total CHOL/HDL Ratio 2.8 RATIO   VLDL 12 0 -  40 mg/dL   LDL Cholesterol 191 (H) 0 - 99 mg/dL    Comment:        Total Cholesterol/HDL:CHD Risk Coronary Heart Disease Risk Table                     Men   Women  1/2 Average Risk   3.4   3.3  Average Risk       5.0   4.4  2 X Average Risk   9.6   7.1  3 X Average Risk  23.4   11.0        Use the calculated Patient Ratio above and the CHD Risk Table to determine the patient's CHD Risk.        ATP III CLASSIFICATION (LDL):  <100     mg/dL   Optimal  478-295  mg/dL   Near or Above                    Optimal  130-159  mg/dL   Borderline  621-308  mg/dL   High  >657     mg/dL   Very High Performed at [facility], [address] [place], [place] [address]   TSH     Status: None   Collection Time: 11/11/17  6:30 AM  Result Value Ref Range   TSH 2.152 0.350 - 4.500 uIU/mL    Comment: Performed by a 3rd Generation assay with a functional sensitivity of <=0.01 uIU/mL. Performed at [facility], [address] [place], [place] [address]   Prolactin     Status: Abnormal   Collection Time: 11/11/17  6:30 AM  Result Value Ref Range   Prolactin 1.9 (L) 4.0 - 15.2 ng/mL    Comment: (NOTE) Performed At: [facility] [address] [place], [place] [id number] Jolene Schimke MD XB:[id number]     Blood Alcohol level:  Lab Results  Component Value Date   ETH 159 (H) 11/10/2017   ETH 261 (H) 01/22/2016    Metabolic Disorder Labs: Lab Results  Component Value Date   HGBA1C 5.1 11/11/2017   MPG 99.67 11/11/2017   Lab Results  Component Value Date   PROLACTIN 1.9 (L) 11/11/2017   Lab Results  Component Value Date   CHOL 187 11/11/2017   TRIG 60 11/11/2017   HDL 67 11/11/2017   CHOLHDL 2.8 11/11/2017   VLDL 12 11/11/2017   LDLCALC 108 (H) 11/11/2017   LDLCALC 68 06/15/2011    Physical Findings: AIMS:  , ,  ,  ,    CIWA:  CIWA-Ar Total: 2 COWS:     Musculoskeletal: Strength & Muscle Tone: within normal limits Gait &  Station: normal Patient leans: N/A  Psychiatric Specialty Exam: Physical Exam  Constitutional: He is oriented to person, place, and time. He appears well-developed and well-nourished.  HENT:  Head: Normocephalic.  Neck: Normal range of motion.  Respiratory: Effort normal.  Musculoskeletal: Normal range of motion.  Neurological: He is alert and oriented to person, place, and time.  Psychiatric: His speech is normal and behavior is normal. Judgment and thought content normal. His mood appears anxious. His affect is blunt. Cognition and memory are normal. He exhibits a depressed mood.    Review of Systems  Psychiatric/Behavioral: Positive for depression, substance abuse and suicidal ideas. The patient is nervous/anxious.   All other systems reviewed and are negative.   Blood pressure 118/79, pulse (!) 58, temperature 98.5 F (36.9 C), temperature source Oral, resp. rate 16, height 6' (1.829 m), weight 70.3 kg.Body mass index is 21.02 kg/m.  General Appearance: Casual  Eye Contact:  Fair  Speech:  Normal Rate  Volume:  Normal  Mood:  Anxious and Depressed  Affect:  Blunt  Thought Process:  Coherent and Descriptions of Associations: Intact  Orientation:  Full (Time, Place, and Person)  Thought Content:  Rumination  Suicidal Thoughts:  No  Homicidal Thoughts:  No  Memory:  Immediate;   Fair Recent;   Fair Remote;   Fair  Judgement:  Fair  Insight:  Fair  Psychomotor Activity:  Normal  Concentration:  Concentration: Fair and Attention Span: Fair  Recall:  [employer] of Knowledge:  Fair  Language:  Good  Akathisia:  No  Handed:  Right  AIMS (if indicated):     Assets:  Housing Leisure Time Physical Health Resilience Social Support  ADL's:  Intact  Cognition:  WNL  Sleep:  Number of Hours: 6.25     Treatment Plan Summary: Daily contact with patient to assess and evaluate symptoms and progress in treatment, Medication management and Plan bipolar affective disorder,  depressed, severe without psychosis:  -Continued Wellbutrin 150 mg daily  Alcohol withdrawal: -Continued Librium alcohol detox protocol  Anxiety: -Started Vistaril 25 mg every six hours PRN   Insomnia: -Started Trazodone 50 mg at bedtime PRN  Nanine Means, NP 11/12/2017, 12:23 PM

## 2017-11-12 NOTE — [facility] Group Notes (Signed)
LCSW Group Therapy Note  11/12/2017     10:00-11:00AM  Type of Therapy and Topic:  Group Therapy:  Decisional Balance/Substance Use  Participation Level:  Active         Description of Group:  The main focus of today's process group was learning how to use a decisional balance exercise to make a decision about whether to change an unhealthy coping skill, as well as how to use the information gathered in the actual process of planning that change.  Patients listed some of their most frequently utilized unhealthy coping techniques and CSW pointed out the similarities.  Motivational Interviewing and the whiteboard were utilized to help patients explore in-depth the perceived benefits and costs of a specific, shared unhealthy coping technique (drinking & drugging) as well as the benefits and costs of replacing that with other, healthy coping skills.  A handout was distributed for patients to be able to do this exercise for themselves.     Therapeutic Goals 1. Patient will be able to utilize the decision balance exercise on their own 2. Patient will list coping skills they use to fulfill their needs 3. Patient will identify the differences between healthy and  unhealthy coping skills 4. Patient will verbalize the costs and benefits of drinking/drugging versus making the choice to change 5. Patient will learn how to use the exercise to identify the most important supports to put in place so that they can succeed in a change to which they commit  Summary of Patient Progress: During group, patient expressed little.  He did listen attentively.  CSW had started group with the intention of talking about it being International Overdose Awareness Day and he said he has lost people to overdose.  Other patients were angry about restrictions they feel have been placed on them from treatment providers throughout the years, but he did not add to this complaint list; however, seemed attentive.   Therapeutic  Modalities Cognitive Behavioral Therapy Motivational Interviewing   Lynnell ChadMareida J Grossman-Orr, LCSW

## 2017-11-12 NOTE — Progress Notes (Signed)
D: Patient continues on the librium protocol and is tolerating well.  He denies any significant withdrawal symptoms.  He rates his depression and hopelessness as a 5; anxiety as a 3.  His goal today is to work on "release plan."  He is sleeping and eating well; his energy level is low and his concentration is good.  He denies any thoughts of self harm today.  His affect is flat; blunted; his mood appears stable.  A: Continue to monitor medication management and MD orders.  Safety checks completed every 15 minutes per protocol.  Offer support and encouragement as needed.  R: Patient is receptive to staff; his behavior is appropriate.

## 2017-11-12 NOTE — Progress Notes (Signed)
D:  Nicholas Melton was pleasant and cooperative.  He was visible on the unit.  Attending groups and interacting well with staff and peers.  He stated that he had a good day and was glad that he slept better last night.  He voiced questions about his home medications and why he wasn't on Abilify and gabapentin.  Encouraged him to speak with the doctor tomorrow about those medication.  Gave him a journal to write down list of questions so he would not forget to mention them to the doctor.  He denied SI/HI or A/V hallucinations.  He took his HS medications without difficulty.   A:  1:1 with RN for support and encouragement.  Medications as ordered.  Q 15 minute checks maintained for safety.  Encouraged participation in group and unit activities.   R:  Nicholas Melton remains safe on the unit.  We will continue to monitor the progress towards his goals.

## 2017-11-12 NOTE — Plan of Care (Signed)
  Problem: Medication: Goal: Compliance with prescribed medication regimen will improve Outcome: Progressing-Nicholas Melton takes his medications as prescribed and will ask staff for prn's if needed.   Problem: Activity: Goal: Imbalance in normal sleep/wake cycle will improve Outcome: Progressing-complained of not sleeping well.  Orders obtained for sleep medication.

## 2017-11-12 NOTE — Progress Notes (Signed)
D:  Nicholas Melton was up and visible on the unit.  He denied SI/HI or A/V hallucinations.  He attended evening AA group.  He denied any pain or discomfort and appeared to be in no physical distress.  He did complain of difficulty sleeping last night.  Staffed with Karleen HampshireSpencer PA and new order for trazodone obtained.  He took hs medication without difficulty.  He is currently resting with his eyes closed and appears to be asleep. A:  1:1 with RN for support and encouragement.  Medications as ordered.  Q 15 minute checks maintained for safety.  Encouraged participation in group and unit activities.   R:  Nicholas Melton remains safe on the unit.  We will continue to monitor the progress towards his goals.

## 2017-11-12 NOTE — Progress Notes (Signed)
Patient stated that he had a better day today since he felt that he is finally "waking up". In addition, he stated that he is feeling more "motivated" and was able to have a good talk with his wife. He verbalized that his wife does not have a car to drive but was able to talk to her on the phone. His goal for tomorrow is to speak with his doctor since he has a number of questions.

## 2017-11-13 MED ORDER — GABAPENTIN 400 MG PO CAPS
400.0000 mg | ORAL_CAPSULE | Freq: Two times a day (BID) | ORAL | Status: DC
  Administered 2017-11-13 – 2017-11-14 (×2): 400 mg via ORAL
  Filled 2017-11-13: qty 1
  Filled 2017-11-13 (×2): qty 14
  Filled 2017-11-13 (×3): qty 1

## 2017-11-13 MED ORDER — ARIPIPRAZOLE 15 MG PO TABS
15.0000 mg | ORAL_TABLET | Freq: Every day | ORAL | Status: DC
  Administered 2017-11-13 – 2017-11-14 (×2): 15 mg via ORAL
  Filled 2017-11-13 (×4): qty 1
  Filled 2017-11-13: qty 7

## 2017-11-13 NOTE — Progress Notes (Signed)
  Pt presents with a flat affect and depressed mood. Pt appeared to be cautious and guarded during shift assessment. Pt expressed that he wanted to speak with the doctor in regards to discharging home. Pt stated that his discharge plan is to attend 15 step meetings. Pt denies SI/HI. Pt stated goal for today, "talk to the Dr". Pt refuses to take Clonidine per protocol.   Pt denies any withdrawal symptoms. Medications reviewed with pt. Verbal support provided. Pt encouraged to attend groups.15 minute checks performed for safety.   Pt compliant with tx plan.

## 2017-11-13 NOTE — [facility] Group Notes (Signed)
Adult Psychoeducational Group Note  Date:  11/13/2017 Time:  9:07 AM  Group Topic/Focus:  Goals Group:   The focus of this group is to help patients establish daily goals to achieve during treatment and discuss how the patient can incorporate goal setting into their daily lives to aide in recovery.  Participation Level:  Active  Participation Quality:  Appropriate  Affect:  Appropriate  Cognitive:  Appropriate  Insight: Appropriate  Engagement in Group:  Engaged  Modes of Intervention:  Orientation  Additional Comments:  Pt attended and participated in orientation group. Pt goal for today is to talk with doctor about discharge plan.  Dellia Nims 11/13/2017, 9:07 AM

## 2017-11-13 NOTE — Progress Notes (Signed)
The patient attended the evening A.A.meeting and was appropriate.  

## 2017-11-13 NOTE — Progress Notes (Signed)
D:  Nicholas Melton was up and visible on the unit.  He mainly keeps to himself but is pleasant and cooperative.  He did attend evening group.  He denied SI/HI or A/V hallucinations.  He reported having a good visit with his wife tonight and is looking forward to going home tomorrow.  He was having difficulty falling asleep and did get his second dose of trazodone.  He denied any pain or discomfort and appeared to be in no physical distress.   A:  1:1 with RN for support and encouragement.  Medications as ordered.  Q 15 minute checks maintained for safety.  Encouraged participation in group and unit activities.   R:  Nicholas Melton remains safe on the unit.  We will continue to monitor the progress towards his goals.

## 2017-11-13 NOTE — Progress Notes (Addendum)
Patient ID: Nicholas Melton, male   DOB: 23-Aug-1970, 47 y.o.   MRN: [id number]  [facility] MD Progress Note  11/13/2017 10:38 AM Nicholas Melton  MRN:  [id number] Subjective:  Patient reports, "He just needed to get away and time to think which made things better."  Sober for a year until he returned to work 3 months ago. No withdrawal symptoms. Depression is a 1/10 and anxiety 1/10.  Anxious about his job due to not knowing when he is going to get out.  No suicidal ideations. Tearful about cat recently dying but adopted two new kittens.  He goes to [place] and sees Nicholas Melton but has not been in [place], also sees a Veterinary surgeon.  Feels safe returning home.  Planning on attending AA or NA.  47 year old male with a past psychiatric history significant for alcohol dependence, reportedly bipolar disorder type II; most recently depressed who presented to the [facility] emergency department yesterday with suicidal ideation. The patient was brought to the emergency room by his wife. The patient reported that he was going to go to a gun range, get a gun, and then shoot himself. The patient stated that he had been sober from alcohol for approximately 1 year, and had been applying for disability. He was turned down for disability, and had to go back to work. 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 He stated he relapsed after he had to start working again. He stated he was drinking approximately 7-10 beers a day. He denied any previous history of seizures, but did have tremors and other symptoms of uncomplicated withdrawal. He does have a history of suicide attempts, and actually had a hospitalization in 2016 where he required inpatient medical hospitalization because of the alcohol. Patient has been treated for depression with Wellbutrin. He is unsure of the dosage. He admitted to helplessness, hopelessness and worthlessness. He admitted to suicidal ideation.  [name] is seen in  his room as he is lying on his bed resting, chart reviewed. The chart findings discussed with the treatment team. Today, he presents alert &oriented x 4. He is visible on the unit attending group sessions. He denies any adverse effects. Denies any SIHI or AVH & no psychosis observed. He is complaint with his medications and today denies any intolerances or medication related side effects. Nicholas Melton' wife is supportive and visits/calls him.  He is more concerned about his job at this time and worried he will lose it, MD will review for discharge potential,  He would like to go to work on Monday but definitely Tuesday.  Principal Problem: Bipolar 1 disorder, depressed, severe (HCC) Diagnosis:   Patient Active Problem List   Diagnosis Date Noted  . Bipolar 1 disorder, depressed, severe (HCC) [F31.4] 11/11/2017    Priority: High  . Alcohol dependence (HCC) [F10.20] 06/17/2011    Priority: High  . PTSD (post-traumatic stress disorder) [F43.10] 06/15/2011    Priority: High    Class: Acute  . Substance induced mood disorder (HCC) [F19.94]   . Acute encephalopathy [G93.40]   . Overdose [T50.901A] 01/22/2016  . Malnutrition of moderate degree [E44.0] 01/22/2016  . Intentional drug overdose (HCC) [T50.902A]   . Acute respiratory failure with hypoxemia (HCC) [J96.01]   . Alcohol intoxication with delirium (HCC) [F10.921]   . Alcohol use disorder, severe, dependence (HCC) [F10.20] 02/14/2015  . Bipolar 2 disorder, major depressive episode (HCC) [F31.81] 02/14/2015  . Alcohol consumption of more than four drinks per day [Z78.9] 06/15/2011  .  Homeless [Z59.0] 06/15/2011   Total Time spent with patient: 15 minutes  Past Psychiatric History: alcohol abuse, bipolar d/o, PTSD  Past Medical History:  Past Medical History:  Diagnosis Date  . Anxiety   . Bipolar 2 disorder (HCC)   . Depression   . Hypertension   . Night terrors, adult   . OCD (obsessive compulsive disorder)   . Panic attacks   .  Posttraumatic stress disorder   . Social phobia     Past Surgical History:  Procedure Laterality Date  . THYROIDECTOMY    . VASECTOMY     Family History:  Family History  Problem Relation Age of Onset  . Cancer Brother    Family Psychiatric  History: none Social History:  Social History   Substance and Sexual Activity  Alcohol Use Yes  . Alcohol/week: 112.0 standard drinks  . Types: 112 Cans of beer per week     Social History   Substance and Sexual Activity  Drug Use Yes  . Types: Cocaine   Comment: reports uses appr. once a year    Social History   Socioeconomic History  . Marital status: Single    Spouse name: Not on file  . Number of children: Not on file  . Years of education: Not on file  . Highest education level: Not on file  Occupational History  . Not on file  Social Needs  . Financial resource strain: Not on file  . Food insecurity:    Worry: Not on file    Inability: Not on file  . Transportation needs:    Medical: Not on file    Non-medical: Not on file  Tobacco Use  . Smoking status: Current Every Day Smoker    Packs/day: 2.00    Years: 15.00    Pack years: 30.00    Types: Cigarettes  . Smokeless tobacco: Former Engineer, water and Sexual Activity  . Alcohol use: Yes    Alcohol/week: 112.0 standard drinks    Types: 112 Cans of beer per week  . Drug use: Yes    Types: Cocaine    Comment: reports uses appr. once a year  . Sexual activity: Yes  Lifestyle  . Physical activity:    Days per week: Not on file    Minutes per session: Not on file  . Stress: Not on file  Relationships  . Social connections:    Talks on phone: Not on file    Gets together: Not on file    Attends religious service: Not on file    Active member of club or organization: Not on file    Attends meetings of clubs or organizations: Not on file    Relationship status: Not on file  Other Topics Concern  . Not on file  Social History Narrative  . Not on file    Additional Social History:   Sleep: Fair  Appetite:  Fair  Current Medications: Current Facility-Administered Medications  Medication Dose Route Frequency Provider Last Rate Last Dose  . acetaminophen (TYLENOL) tablet 650 mg  650 mg Oral Q6H PRN Nira Conn A, NP      . alum & mag hydroxide-simeth (MAALOX/MYLANTA) 200-200-20 MG/5ML suspension 30 mL  30 mL Oral Q4H PRN Nira Conn A, NP      . buPROPion (WELLBUTRIN XL) 24 hr tablet 150 mg  150 mg Oral Daily Antonieta Pert, MD   150 mg at 11/13/17 0748  . chlordiazePOXIDE (LIBRIUM) capsule 25 mg  25  mg Oral Q6H PRN Nira Conn A, NP      . chlordiazePOXIDE (LIBRIUM) capsule 25 mg  25 mg Oral BH-qamhs Nira Conn A, NP   25 mg at 11/13/17 0749   Followed by  . [START ON 11/14/2017] chlordiazePOXIDE (LIBRIUM) capsule 25 mg  25 mg Oral Daily Nira Conn A, NP      . hydrOXYzine (ATARAX/VISTARIL) tablet 25 mg  25 mg Oral Q6H PRN Nira Conn A, NP   25 mg at 11/11/17 2138  . loperamide (IMODIUM) capsule 2-4 mg  2-4 mg Oral PRN Nira Conn A, NP      . magnesium hydroxide (MILK OF MAGNESIA) suspension 30 mL  30 mL Oral Daily PRN Nira Conn A, NP      . nicotine (NICODERM CQ - dosed in mg/24 hours) patch 21 mg  21 mg Transdermal Daily Donell Sievert E, PA-C   21 mg at 11/13/17 0749  . ondansetron (ZOFRAN-ODT) disintegrating tablet 4 mg  4 mg Oral Q6H PRN Nira Conn A, NP      . thiamine (VITAMIN B-1) tablet 100 mg  100 mg Oral Daily Nira Conn A, NP   100 mg at 11/13/17 0748  . traZODone (DESYREL) tablet 50 mg  50 mg Oral QHS,MR X 1 Kerry Hough, PA-C   50 mg at 11/12/17 2130    Lab Results:  No results found for this or any previous visit (from the past 48 hour(s)).  Blood Alcohol level:  Lab Results  Component Value Date   ETH 159 (H) 11/10/2017   ETH 261 (H) 01/22/2016    Metabolic Disorder Labs: Lab Results  Component Value Date   HGBA1C 5.1 11/11/2017   MPG 99.67 11/11/2017   Lab Results  Component Value  Date   PROLACTIN 1.9 (L) 11/11/2017   Lab Results  Component Value Date   CHOL 187 11/11/2017   TRIG 60 11/11/2017   HDL 67 11/11/2017   CHOLHDL 2.8 11/11/2017   VLDL 12 11/11/2017   LDLCALC 108 (H) 11/11/2017   LDLCALC 68 06/15/2011    Physical Findings: AIMS:  , ,  ,  ,    CIWA:  CIWA-Ar Total: 0 COWS:     Musculoskeletal: Strength & Muscle Tone: within normal limits Gait & Station: normal Patient leans: N/A  Psychiatric Specialty Exam: Physical Exam  Constitutional: He is oriented to person, place, and time. He appears well-developed and well-nourished.  HENT:  Head: Normocephalic.  Neck: Normal range of motion.  Respiratory: Effort normal.  Musculoskeletal: Normal range of motion.  Neurological: He is alert and oriented to person, place, and time.  Psychiatric: His speech is normal and behavior is normal. Judgment and thought content normal. His affect is blunt. Cognition and memory are normal. He exhibits a depressed mood.    Review of Systems  Psychiatric/Behavioral: Positive for depression and substance abuse.  All other systems reviewed and are negative.   Blood pressure 111/83, pulse 82, temperature 97.6 F (36.4 C), temperature source Oral, resp. rate 16, height 6' (1.829 m), weight 70.3 kg.Body mass index is 21.02 kg/m.  General Appearance: Casual  Eye Contact:  Fair  Speech:  Normal Rate  Volume:  Normal  Mood:  Anxious and Depressed  Affect:  Blunt  Thought Process:  Coherent and Descriptions of Associations: Intact  Orientation:  Full (Time, Place, and Person)  Thought Content:  Rumination  Suicidal Thoughts:  No  Homicidal Thoughts:  No  Memory:  Immediate;   Fair Recent;  Fair Remote;   Fair  Judgement:  Fair  Insight:  Fair  Psychomotor Activity:  Normal  Concentration:  Concentration: Fair and Attention Span: Fair  Recall:  [employer] of Knowledge:  Fair  Language:  Good  Akathisia:  No  Handed:  Right  AIMS (if indicated):      Assets:  Housing Leisure Time Physical Health Resilience Social Support  ADL's:  Intact  Cognition:  WNL  Sleep:  Number of Hours: 6.75     Treatment Plan Summary: Daily contact with patient to assess and evaluate symptoms and progress in treatment, Medication management and Plan bipolar affective disorder, depressed, severe without psychosis:  -Continued Wellbutrin 150 mg daily -Started his home medication of Abilify 15 mg daily   Alcohol withdrawal: -Continued Librium alcohol detox protocol  Anxiety: -Continued Vistaril 25 mg every six hours PRN  -Started gabapentin from his home mediations at 400 mg BID   Insomnia: -Continued Trazodone 50 mg at bedtime PRN  Nanine Means, NP 11/13/2017, 10:38 AM

## 2017-11-13 NOTE — Progress Notes (Signed)
Adult Psychoeducational Group Note  Date:  11/13/2017 Time: 1300  Group Topic/Focus:  Coping with Anger  Participation Level:  Active  Participation Quality:  Appropriate  Affect:  Appropriate  Cognitive:  Appropriate  Insight: Appropriate  Engagement in Group:  Engaged  Modes of Intervention:  Discussion and Education  Additional Comments:     

## 2017-11-13 NOTE — Plan of Care (Signed)
  Problem: Safety: Goal: Ability to remain free from injury will improve Outcome: Progressing-no injuries noted during withdrawal from alcohol.

## 2017-11-13 NOTE — Plan of Care (Signed)
  Problem: Coping: Goal: Coping ability will improve Outcome: Progressing   Problem: Health Behavior/Discharge Planning: Goal: Ability to identify changes in lifestyle to reduce recurrence of condition will improve Outcome: Progressing

## 2017-11-13 NOTE — [facility] Suicide Risk Assessment (Signed)
[facility] INPATIENT:  Family/Significant Other Suicide Prevention Education  Suicide Prevention Education:  Education Completed; wife Nicholas Melton [phone] has been identified by the patient as the family member/significant other with whom the patient will be residing, and identified as the person(s) who will aid the patient in the event of a mental health crisis (suicidal ideations/suicide attempt).  Wife reports some concern that patient's meds have not been changed.  She stated there are no weapons in the home.  She was happy to get the Mobile Crisis number.    With written consent from the patient, the family member/significant other has been provided the following suicide prevention education, prior to the and/or following the discharge of the patient.  The suicide prevention education provided includes the following:  Suicide risk factors  Suicide prevention and interventions  National Suicide Hotline telephone number  [facility] assessment telephone number  [facility] Emergency Assistance 911  [facility] and/or Residential Mobile Crisis Unit telephone number  Request made of family/significant other to:  Remove weapons (e.g., guns, rifles, knives), all items previously/currently identified as safety concern.    Remove drugs/medications (over-the-counter, prescriptions, illicit drugs), all items previously/currently identified as a safety concern.  The family member/significant other verbalizes understanding of the suicide prevention education information provided.  The family member/significant other agrees to remove the items of safety concern listed above.  Nicholas Melton 11/13/2017, 4:00 PM

## 2017-11-13 NOTE — [facility] Group Notes (Signed)
[facility] LCSW Group Therapy Note  11/13/2017  10:00-11:00AM  Type of Therapy and Topic:  Group Therapy:  Being Your Own Support  Participation Level:  Active   Description of Group:  Patients in this group were introduced to the concept that self-support is an essential part of recovery.  A song entitled "My Own Hero" was played and a group discussion ensued in which patients stated they could relate to the song and it inspired them to realize they have be willing to help themselves in order to succeed, because other people cannot achieve sobriety or stability for them.  We discussed adding a variety of healthy supports to address the various needs in their lives.  A song was played called "I Know Where I've Been" toward the end of group and used to conduct an inspirational wrap-up to group of remembering how far they have already come in their journey.  Therapeutic Goals: 1)  demonstrate the importance of being a part of one's own support system 2)  discuss reasons people in one's life may eventually be unable to be continually supportive  3)  identify the patient's current support system and   4)  elicit commitments to add healthy supports and to become more conscious of being self-supportive   Summary of Patient Progress:  The patient expressed that his wife is a healthy support for him and encourages him not to use alcohol.  He stated his mother does not know he is in the hospital because she has seen him go through this so much and he hopes that it will all be over soon and his mother never has to know.  He said some former friends and he himself are unhealthy supports and he realizes he needs to "be my own hero" and be part of the solution.  Therapeutic Modalities:   Motivational Interviewing Activity  Lynnell Chad

## 2017-11-14 ENCOUNTER — Encounter (HOSPITAL_COMMUNITY): Payer: Self-pay | Admitting: Behavioral Health

## 2017-11-14 MED ORDER — HYDROXYZINE HCL 25 MG PO TABS: 25.0000 mg | ORAL_TABLET | Freq: Four times a day (QID) | ORAL | 0 refills | Status: DC | PRN

## 2017-11-14 MED ORDER — BUPROPION HCL ER (XL) 150 MG PO TB24: 150.0000 mg | ORAL_TABLET | Freq: Every day | ORAL | 0 refills | Status: DC

## 2017-11-14 MED ORDER — GABAPENTIN 400 MG PO CAPS: 400.0000 mg | ORAL_CAPSULE | Freq: Two times a day (BID) | ORAL | 0 refills | Status: DC

## 2017-11-14 MED ORDER — TRAZODONE HCL 50 MG PO TABS: 50.0000 mg | ORAL_TABLET | Freq: Every evening | ORAL | 0 refills | Status: DC | PRN

## 2017-11-14 MED ORDER — ARIPIPRAZOLE 15 MG PO TABS: 15.0000 mg | ORAL_TABLET | Freq: Every day | ORAL | 0 refills | Status: DC

## 2017-11-14 NOTE — Progress Notes (Signed)
D: Pt A & O X 4. Denies SI, HI, AVH and pain at this time. Rates his depression, anxiety and hopelessness all D/C instructions reviewed with 0/10 on self inventory sheet. D/C home as ordered. Picked up in lobby by Lift driver. A: D/C instructions reviewed with pt including prescriptions, medication samples and follow up appointment; compliance encouraged. All belongings from locker #16 given to pt at time of departure. Scheduled medications given with verbal education and effects monitored. Safety checks maintained without incident till time of d/c.  R: Pt receptive to care. Compliant with medications when offered. Denies adverse drug reactions when assessed. Verbalized understanding related to d/c instructions. Signed belonging sheet in agreement with items received from locker. Ambulatory with a steady gait. Appears to be in no physical distress at time of departure.

## 2017-11-14 NOTE — Progress Notes (Signed)
Recreation Therapy Notes  Date: 11/14/17 Time: 0930 Location: [address] Dayroom  Group Topic: Stress Management  Goal Area(s) Addresses:  Patient will verbalize importance of using healthy stress management.  Patient will identify positive emotions associated with healthy stress management.   Behavioral Response: Engaged  Intervention: Stress Management  Activity :  Meditation.  LRT introduced the stress management technique of meditation.  Patients were to listen and follow along as the meditation played in order to relax.  Education:  Stress Management, Discharge Planning.   Education Outcome: Acknowledges edcuation/In group clarification offered/Needs additional education  Clinical Observations/Feedback: Pt attended group.     Dyneshia Baccam, LRT/CTRS         Keatyn Luck A 11/14/2017 12:11 PM 

## 2017-11-14 NOTE — Discharge Summary (Signed)
Physician Discharge Summary Note  Patient:  Nicholas Melton is an 47 y.o., male MRN:  [id number] DOB:  03-Jun-1970 Patient phone:  [phone] (home)  Patient address:   [address] [place] [address],  Total Time spent with patient: 30 minutes  Date of Admission:  11/11/2017 Date of Discharge: 11/14/2017  Reason for Admission:  suicidal ideation  Principal Problem: Bipolar 1 disorder, depressed, severe (HCC) Discharge Diagnoses: Patient Active Problem List   Diagnosis Date Noted  . Bipolar 1 disorder, depressed, severe (HCC) [F31.4] 11/11/2017  . Substance induced mood disorder (HCC) [F19.94]   . Acute encephalopathy [G93.40]   . Overdose [T50.901A] 01/22/2016  . Malnutrition of moderate degree [E44.0] 01/22/2016  . Intentional drug overdose (HCC) [T50.902A]   . Acute respiratory failure with hypoxemia (HCC) [J96.01]   . Alcohol intoxication with delirium (HCC) [F10.921]   . Alcohol use disorder, severe, dependence (HCC) [F10.20] 02/14/2015  . Bipolar 2 disorder, major depressive episode (HCC) [F31.81] 02/14/2015  . Alcohol dependence (HCC) [F10.20] 06/17/2011  . Alcohol consumption of more than four drinks per day [Z78.9] 06/15/2011  . PTSD (post-traumatic stress disorder) [F43.10] 06/15/2011    Class: Acute  . Homeless [Z59.0] 06/15/2011    Past Psychiatric History: Patient's last psychiatric mission to our facility was on 02/14/2015.  Diagnosis at that time was substance-induced mood disorder, alcohol dependence.  He also had a hospitalization in November 2017 with acute respiratory failure in which alcohol was significantly involved.  Past Medical History:  Past Medical History:  Diagnosis Date  . Anxiety   . Bipolar 2 disorder (HCC)   . Depression   . Hypertension   . Night terrors, adult   . OCD (obsessive compulsive disorder)   . Panic attacks   . Posttraumatic stress disorder   . Social phobia     Past Surgical History:  Procedure Laterality Date   . THYROIDECTOMY    . VASECTOMY     Family History:  Family History  Problem Relation Age of Onset  . Cancer Brother    Family Psychiatric  History: He has been diagnosed with substance-induced mood disorder, bipolar disorder, bipolar depression as well as major depression. Social History:  Social History   Substance and Sexual Activity  Alcohol Use Yes  . Alcohol/week: 112.0 standard drinks  . Types: 112 Cans of beer per week     Social History   Substance and Sexual Activity  Drug Use Yes  . Types: Cocaine   Comment: reports uses appr. once a year    Social History   Socioeconomic History  . Marital status: Single    Spouse name: Not on file  . Number of children: Not on file  . Years of education: Not on file  . Highest education level: Not on file  Occupational History  . Not on file  Social Needs  . Financial resource strain: Not on file  . Food insecurity:    Worry: Not on file    Inability: Not on file  . Transportation needs:    Medical: Not on file    Non-medical: Not on file  Tobacco Use  . Smoking status: Current Every Day Smoker    Packs/day: 2.00    Years: 15.00    Pack years: 30.00    Types: Cigarettes  . Smokeless tobacco: Former Engineer, water and Sexual Activity  . Alcohol use: Yes    Alcohol/week: 112.0 standard drinks    Types: 112 Cans of beer per week  .  Drug use: Yes    Types: Cocaine    Comment: reports uses appr. once a year  . Sexual activity: Yes  Lifestyle  . Physical activity:    Days per week: Not on file    Minutes per session: Not on file  . Stress: Not on file  Relationships  . Social connections:    Talks on phone: Not on file    Gets together: Not on file    Attends religious service: Not on file    Active member of club or organization: Not on file    Attends meetings of clubs or organizations: Not on file    Relationship status: Not on file  Other Topics Concern  . Not on file  Social History Narrative  .  Not on file    Hospital Course:   Patient is seen and examined. Patient is a 47 year old male with a past psychiatric history significant for alcohol dependence, reportedly bipolar disorder type II; most recently depressed who presented to the [facility] emergency department yesterday with suicidal ideation. The patient was brought to the emergency room by his wife. The patient reported that he was going to go to a gun range, get a gun, and then shoot himself. The patient stated that he had been sober from alcohol for approximately 1 year, and had been applying for disability. He was turned down for disability, and had to go back to work. 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 He stated he relapsed after he had to start working again. He stated he was drinking approximately 7-10 beers a day. He denied any previous history of seizures, but did have tremors and other symptoms of uncomplicated withdrawal. He does have a history of suicide attempts, and actually had a hospitalization in 2016 where he required inpatient medical hospitalization because of the alcohol. Patient has been treated for depression with Wellbutrin. He is unsure of the dosage. He admitted to helplessness, hopelessness and worthlessness. He admitted to suicidal ideation. He was admitted to the hospital for evaluation and stabilization  Nicholas Melton was started on medication regimen for presenting symptoms. He was medicated & discharged on;   depressed, severe without psychosis:  - Wellbutrin 150 mg daily - resumed home medication of Abilify 15 mg daily   Alcohol withdrawal: -[facility] alcohol detox protocol completed  Anxiety: -Vistaril 25 mg every six hours PRN  -resumed gabapentin from his home mediations at 400 mg BID   Insomnia: -Trazodone 50 mg at bedtime PRN  Patient has been adherent with treatment recommendations. Patient tolerated the medications without any reported side  effects are adverse reactions.  Patient was enrolled & participated in the group counseling sessions being offerred & held on this unit. Patient learned coping skills.  Nicholas Melton is seen today by the attending psychiatrist for discharge. Patient denies any delusions, no hallucinations or other psychotic process. Patient denies active or passive suicidal thoughts. No thoughts of violence. No craving for drugs. Endorses overall improvement in mood emotional state.    Nursing staff reports that patient has been appropriate on the unit. Patient has been interacting well with peers. No behavioral issues. Patient has not voiced any suicidal thoughts. Prior to discharge. Patient was discussed at the treatment team meeting this morning. Team members feels that patient is back to his baseline level of functioning. Team agrees with plan to discharge patient today. Patient was provided with all follow-up information to resume mental health treatment following discharge as noted  below. Nicholas Melton was provided with a prescription for his [facility] discharge medications.  Patient left [facility] with all personal belongings in no apparent distress. Transportation per patient/ family arrangement.    Labs: Reviewed and noted as below. UDS negative and ethanol negative.   Physical Findings: AIMS:  , ,  ,  ,    CIWA:  CIWA-Ar Total: 0 COWS:     Musculoskeletal: Strength & Muscle Tone: within normal limits Gait & Station: normal Patient leans: N/A  Psychiatric Specialty Exam: SEE SRA BY MD  Physical Exam  Nursing note and vitals reviewed. Constitutional: He is oriented to person, place, and time.  Neurological: He is alert and oriented to person, place, and time.    Review of Systems  Psychiatric/Behavioral: Positive for substance abuse. Negative for hallucinations, memory loss and suicidal ideas. Depression: improved. Nervous/anxious: improved. Insomnia: improved.   All other systems reviewed and are negative.   Blood  pressure 111/89, pulse 94, temperature 97.7 F (36.5 C), temperature source Oral, resp. rate 16, height 6' (1.829 m), weight 70.3 kg.Body mass index is 21.02 kg/m.    Have you used any form of tobacco in the last 30 days? (Cigarettes, Smokeless Tobacco, Cigars, and/or Pipes): Yes  Has this patient used any form of tobacco in the last 30 days? (Cigarettes, Smokeless Tobacco, Cigars, and/or Pipes)  Yes, A prescription for an FDA-approved tobacco cessation medication was offered at discharge and the patient refused  Blood Alcohol level:  Lab Results  Component Value Date   ETH 159 (H) 11/10/2017   ETH 261 (H) 01/22/2016    Metabolic Disorder Labs:  Lab Results  Component Value Date   HGBA1C 5.1 11/11/2017   MPG 99.67 11/11/2017   Lab Results  Component Value Date   PROLACTIN 1.9 (L) 11/11/2017   Lab Results  Component Value Date   CHOL 187 11/11/2017   TRIG 60 11/11/2017   HDL 67 11/11/2017   CHOLHDL 2.8 11/11/2017   VLDL 12 11/11/2017   LDLCALC 108 (H) 11/11/2017   LDLCALC 68 06/15/2011    See Psychiatric Specialty Exam and Suicide Risk Assessment completed by Attending Physician prior to discharge.  Discharge destination:  Home  Is patient on multiple antipsychotic therapies at discharge:  No   Has Patient had three or more failed trials of antipsychotic monotherapy by history:  No  Recommended Plan for Multiple Antipsychotic Therapies: NA   Allergies as of 11/14/2017      Reactions   Risperidone And Related Other (See Comments)   Faint, dizzy, lethargic    Seroquel [quetiapine] Other (See Comments)   Faint, dizzy, lethargic    Depakote Er [divalproex Sodium Er] Diarrhea   Ambien [zolpidem Tartrate] Other (See Comments)   Hallucinations    Lamictal [lamotrigine] Nausea And Vomiting      Medication List    STOP taking these medications   lithium carbonate 150 MG capsule   propranolol 10 MG tablet Commonly known as:  INDERAL     TAKE these medications      Indication  ARIPiprazole 15 MG tablet Commonly known as:  ABILIFY Take 1 tablet (15 mg total) by mouth daily.  Indication:  mood stabilization   buPROPion 150 MG 24 hr tablet Commonly known as:  WELLBUTRIN XL Take 1 tablet (150 mg total) by mouth daily.  Indication:  Major Depressive Disorder   gabapentin 400 MG capsule Commonly known as:  NEURONTIN Take 1 capsule (400 mg total) by mouth 2 (two) times daily.  Indication:  anxiety   hydrOXYzine 25 MG tablet Commonly known as:  ATARAX/VISTARIL Take 1 tablet (25 mg total) by mouth every 6 (six) hours as needed for anxiety (or CIWA score </= 10). What changed:    how much to take  reasons to take this  Indication:  Feeling Anxious   traZODone 50 MG tablet Commonly known as:  DESYREL Take 1 tablet (50 mg total) by mouth at bedtime and may repeat dose one time if needed.  Indication:  Trouble Sleeping      Follow-up Information    [facility] Follow up.   Why:  Because of the holiday an appointment was not yet set.  Your social worker will set an appointment with [name] for medication management and with your therapist and will call you with the information on Tuesday at [phone]. Contact information: [address], [address] [place] [address]          Follow-up recommendations:  Follow up with your outpatient provided for any medical issues. Activity & diet as recommended by your primary care provider.  Comments:  Patient is instructed prior to discharge to: Take all medications as prescribed by his/her mental healthcare provider. Report any adverse effects and or reactions from the medicines to his/her outpatient provider promptly. Patient has been instructed & cautioned: To not engage in alcohol and or illegal drug use while on prescription medicines. In the event of worsening symptoms, patient is instructed to call the crisis hotline, 911 and or go to the  nearest ED for appropriate evaluation and treatment of symptoms. To follow-up with his/her primary care provider for your other medical issues, concerns and or health care needs.  Signed: Denzil Magnuson, NP 11/14/2017, 9:51 AM

## 2017-11-14 NOTE — Progress Notes (Signed)
  [facility] Adult Case Management Discharge Plan :  Will you be returning to the same living situation after discharge:  Yes,  home with wife At discharge, do you have transportation home?: Yes,  wife Do you have the ability to pay for your medications: Yes,  [employer]  Release of information consent forms completed and submitted to medical records by CSW.   Patient to Follow up at: Follow-up Information    [facility] Follow up.   Why:  Because of the holiday an appointment was not yet set.  Your social worker will set an appointment with [name] for medication management and with your therapist and will call you with the information on Tuesday at [phone]. Contact information: [address], [address] [place] [address]          Next level of care provider has access to [facility] [employer] and Suicide Prevention discussed: Yes,  SPE completed with pt's wife. SPI pamphlet and Mobile Crisis informtion provided.  Have you used any form of tobacco in the last 30 days? (Cigarettes, Smokeless Tobacco, Cigars, and/or Pipes): Yes  Has patient been referred to the Quitline?: Patient refused referral  Patient has been referred for addiction treatment: Yes  Rona Ravens, LCSW 11/14/2017, 8:28 AM

## 2017-11-14 NOTE — [facility] Suicide Risk Assessment (Signed)
[facility] Discharge Suicide Risk Assessment   Principal Problem: Bipolar 1 disorder, depressed, severe (HCC) Discharge Diagnoses:  Patient Active Problem List   Diagnosis Date Noted  . Bipolar 1 disorder, depressed, severe (HCC) [F31.4] 11/11/2017  . Substance induced mood disorder (HCC) [F19.94]   . Acute encephalopathy [G93.40]   . Overdose [T50.901A] 01/22/2016  . Malnutrition of moderate degree [E44.0] 01/22/2016  . Intentional drug overdose (HCC) [T50.902A]   . Acute respiratory failure with hypoxemia (HCC) [J96.01]   . Alcohol intoxication with delirium (HCC) [F10.921]   . Alcohol use disorder, severe, dependence (HCC) [F10.20] 02/14/2015  . Bipolar 2 disorder, major depressive episode (HCC) [F31.81] 02/14/2015  . Alcohol dependence (HCC) [F10.20] 06/17/2011  . Alcohol consumption of more than four drinks per day [Z78.9] 06/15/2011  . PTSD (post-traumatic stress disorder) [F43.10] 06/15/2011    Class: Acute  . Homeless [Z59.0] 06/15/2011    Total Time spent with patient: 15 minutes  Musculoskeletal: Strength & Muscle Tone: within normal limits Gait & Station: normal Patient leans: N/A  Psychiatric Specialty Exam: Review of Systems  All other systems reviewed and are negative.   Blood pressure 111/89, pulse 94, temperature 97.7 F (36.5 C), temperature source Oral, resp. rate 16, height 6' (1.829 m), weight 70.3 kg.Body mass index is 21.02 kg/m.  General Appearance: Casual  Eye Contact::  Good  Speech:  Normal Rate409  Volume:  Normal  Mood:  Euthymic  Affect:  Appropriate  Thought Process:  Coherent and Descriptions of Associations: Intact  Orientation:  Full (Time, Place, and Person)  Thought Content:  Logical  Suicidal Thoughts:  No  Homicidal Thoughts:  No  Memory:  Immediate;   Fair Recent;   Fair Remote;   Fair  Judgement:  Intact  Insight:  Fair  Psychomotor Activity:  Normal  Concentration:  Good  Recall:  Good  Fund of Knowledge:Fair  Language: Good   Akathisia:  Negative  Handed:  Right  AIMS (if indicated):     Assets:  Communication Skills Desire for Improvement Housing Physical Health Resilience Social Support  Sleep:  Number of Hours: 6.25  Cognition: WNL  ADL's:  Intact   Mental Status Per Nursing Assessment::   On Admission:  Suicide plan, Suicidal ideation indicated by patient  Demographic Factors:  Male, Divorced or widowed, Caucasian and Living alone  Loss Factors: NA  Historical Factors: Impulsivity  Risk Reduction Factors:   Sense of responsibility to family and Positive coping skills or problem solving skills  Continued Clinical Symptoms:  Depression:   Impulsivity Alcohol/Substance Abuse/Dependencies  Cognitive Features That Contribute To Risk:  None    Suicide Risk:  Minimal: No identifiable suicidal ideation.  Patients presenting with no risk factors but with morbid ruminations; may be classified as minimal risk based on the severity of the depressive symptoms  Follow-up Information    [facility] Follow up.   Why:  Because of the holiday an appointment was not yet set.  Your social worker will set an appointment with [name] for medication management and with your therapist and will call you with the information on Tuesday at [phone]. Contact information: [address] [place] [address]          Plan Of Care/Follow-up recommendations:  Activity:  ad lib  Antonieta Pert, MD 11/14/2017, 9:55 AM

## 2017-12-19 ENCOUNTER — Ambulatory Visit: Payer: Self-pay | Admitting: Mental Health

## 2018-01-27 ENCOUNTER — Ambulatory Visit: Payer: Self-pay | Admitting: Psychiatry

## 2018-01-27 ENCOUNTER — Encounter: Payer: Self-pay | Admitting: Psychiatry

## 2018-01-27 VITALS — BP 106/75 | HR 66

## 2018-01-27 DIAGNOSIS — F419 Anxiety disorder, unspecified: Secondary | ICD-10-CM

## 2018-01-27 DIAGNOSIS — F3181 Bipolar II disorder: Secondary | ICD-10-CM

## 2018-01-27 DIAGNOSIS — F1021 Alcohol dependence, in remission: Secondary | ICD-10-CM

## 2018-01-27 MED ORDER — ARIPIPRAZOLE 20 MG PO TABS: 20.0000 mg | ORAL_TABLET | Freq: Every day | ORAL | 1 refills | Status: DC

## 2018-01-27 MED ORDER — HYDROXYZINE PAMOATE 50 MG PO CAPS: 50.0000 mg | ORAL_CAPSULE | Freq: Four times a day (QID) | ORAL | 1 refills | Status: DC | PRN

## 2018-01-27 MED ORDER — GABAPENTIN 400 MG PO CAPS: 400.0000 mg | ORAL_CAPSULE | Freq: Two times a day (BID) | ORAL | 1 refills | Status: DC

## 2018-01-27 MED ORDER — PROPRANOLOL HCL 10 MG PO TABS: 10.0000 mg | ORAL_TABLET | Freq: Every day | ORAL | 1 refills | Status: DC

## 2018-01-27 MED ORDER — BUPROPION HCL ER (XL) 300 MG PO TB24: 300.0000 mg | ORAL_TABLET | Freq: Every day | ORAL | 1 refills | Status: DC

## 2018-01-27 NOTE — Progress Notes (Signed)
Nicholas Melton [id number] 08-Mar-1971 47 y.o.  Subjective:   Patient ID:  Nicholas Melton is a 47 y.o. (DOB 23-Jul-1970) male.  Chief Complaint:  Chief Complaint  Patient presents with  . Anxiety  . Depression    HPI Nicholas Melton presents to the office today for follow-up of depression, anxiety, and sleep disturbance. He is not sure if there has been any improvement in response to increase in Wellbutrin XL. He reports that he has been having difficulty with concentration and focus. Reports anxiety in response to financial stress and reports that tips have been low and at times he has been sent home since business has been slow. Reports that he and his wife have had some significant arguments related to finances. Reports that he has had a couple of panic attacks right before work while in his car and not wanting to go in. Reports motivation has been low. He reports that his energy has been low. He reports that he has been sleeping excessively at times and will sleep until 1 pm if he does not have to work. Sometimes sleeping 10-11 hours on days off and 6-7 on work days. Appetite has been stable.   He reports rare manic s/s and that it is typically brief and occurs at night and has difficulty returning to sleep.   Denies SI. Reports thoughts of wanting to escape.  Denies any recent ETOH use.   Past medication trials: Seroquel-allergic reaction Zyprexa- multiple side effects Risperdal-dissociation Rexulti-felt disoriented Latuda Very lar Abilify-helpful for depression Depakote Lamictal-adverse reaction Lithium-lethargic, irritability Gabapentin-helpful for anxiety Trileptal-lethargic, joint pain, back pain BuSpar-adverse reaction Vistaril Anafranil Mirtazapine-helpful for sleep and night terrors Effexor Paxil Prozac  Wellbutrin-helpful for depression Naltrexone-nausea    Review of Systems:  Review of Systems  Gastrointestinal: Positive for abdominal pain, constipation and  diarrhea.  Musculoskeletal: Negative for gait problem.  Neurological: Positive for tremors.       Reports that tremors have been controlled .  Psychiatric/Behavioral:       Please refer to HPI    Medications: I have reviewed the patient's current medications.  Current Outpatient Medications  Medication Sig Dispense Refill  . gabapentin (NEURONTIN) 400 MG capsule Take 1 capsule (400 mg total) by mouth 2 (two) times daily. 180 capsule 1  . propranolol (INDERAL) 10 MG tablet Take 1 tablet (10 mg total) by mouth daily. 90 tablet 1  . ARIPiprazole (ABILIFY) 20 MG tablet Take 1 tablet (20 mg total) by mouth daily. 90 tablet 1  . buPROPion (WELLBUTRIN XL) 300 MG 24 hr tablet Take 1 tablet (300 mg total) by mouth daily. 90 tablet 1  . hydrOXYzine (VISTARIL) 50 MG capsule Take 1 capsule (50 mg total) by mouth every 6 (six) hours as needed. 270 capsule 1   No current facility-administered medications for this visit.     Medication Side Effects: None  Allergies:  Allergies  Allergen Reactions  . Risperidone And Related Other (See Comments)    Faint, dizzy, lethargic   . Seroquel [Quetiapine] Other (See Comments)    Faint, dizzy, lethargic   . Depakote Er [Divalproex Sodium Er] Diarrhea  . Ambien [Zolpidem Tartrate] Other (See Comments)    Hallucinations   . Lamictal [Lamotrigine] Nausea And Vomiting    Past Medical History:  Diagnosis Date  . Anxiety   . Bipolar 2 disorder (HCC)   . Depression   . Hypertension   . IBS (irritable bowel syndrome)   . Night terrors, adult   . OCD (obsessive  compulsive disorder)   . Panic attacks   . Posttraumatic stress disorder   . Social phobia     Family History  Problem Relation Age of Onset  . Cancer Brother   . Drug abuse Paternal Uncle   . Alcohol abuse Paternal Grandfather     Social History   Socioeconomic History  . Marital status: Married    Spouse name: Not on file  . Number of children: Not on file  . Years of education:  Not on file  . Highest education level: Not on file  Occupational History  . Not on file  Social Needs  . Financial resource strain: Not on file  . Food insecurity:    Worry: Not on file    Inability: Not on file  . Transportation needs:    Medical: Not on file    Non-medical: Not on file  Tobacco Use  . Smoking status: Current Every Day Smoker    Packs/day: 2.00    Years: 15.00    Pack years: 30.00    Types: Cigarettes  . Smokeless tobacco: Former Engineer, waterUser  Substance and Sexual Activity  . Alcohol use: Not Currently    Alcohol/week: 0.0 standard drinks  . Drug use: Not Currently  . Sexual activity: Yes  Lifestyle  . Physical activity:    Days per week: Not on file    Minutes per session: Not on file  . Stress: Not on file  Relationships  . Social connections:    Talks on phone: Not on file    Gets together: Not on file    Attends religious service: Not on file    Active member of club or organization: Not on file    Attends meetings of clubs or organizations: Not on file    Relationship status: Not on file  . Intimate partner violence:    Fear of current or ex partner: Not on file    Emotionally abused: Not on file    Physically abused: Not on file    Forced sexual activity: Not on file  Other Topics Concern  . Not on file  Social History Narrative  . Not on file    Past Medical History, Surgical history, Social history, and Family history were reviewed and updated as appropriate.   Please see review of systems for further details on the patient's review from today.   Objective:   Physical Exam:  BP 106/75   Pulse 66   Physical Exam  Constitutional: He is oriented to person, place, and time. He appears well-developed. No distress.  Musculoskeletal: He exhibits no deformity.  Neurological: He is alert and oriented to person, place, and time. Coordination normal.  Psychiatric: His speech is normal and behavior is normal. Judgment and thought content normal.  His mood appears anxious. His affect is not angry, not blunt, not labile and not inappropriate. Cognition and memory are normal. He exhibits a depressed mood. He expresses no homicidal and no suicidal ideation. He expresses no suicidal plans and no homicidal plans.  Insight intact. No auditory or visual hallucinations. No delusions.     Lab Review:     Component Value Date/Time   NA 136 11/10/2017 2052   K 3.7 11/10/2017 2052   CL 100 11/10/2017 2052   CO2 23 11/10/2017 2052   GLUCOSE 72 11/10/2017 2052   BUN 8 11/10/2017 2052   CREATININE 0.61 11/10/2017 2052   CALCIUM 9.0 11/10/2017 2052   PROT 5.5 (L) 01/23/2016 0242   ALBUMIN  3.7 01/23/2016 0242   AST 25 01/23/2016 0242   ALT 19 01/23/2016 0242   ALKPHOS 42 01/23/2016 0242   BILITOT 1.4 (H) 01/23/2016 0242   GFRNONAA >60 11/10/2017 2052   GFRAA >60 11/10/2017 2052       Component Value Date/Time   WBC 7.1 11/10/2017 2052   RBC 4.58 11/10/2017 2052   HGB 15.6 11/10/2017 2052   HCT 44.5 11/10/2017 2052   PLT 275 11/10/2017 2052   MCV 97.2 11/10/2017 2052   MCH 34.1 (H) 11/10/2017 2052   MCHC 35.1 11/10/2017 2052   RDW 13.8 11/10/2017 2052   LYMPHSABS 3.1 11/10/2017 2052   MONOABS 0.5 11/10/2017 2052   EOSABS 0.2 11/10/2017 2052   BASOSABS 0.0 11/10/2017 2052    No results found for: POCLITH, LITHIUM   No results found for: PHENYTOIN, PHENOBARB, VALPROATE, CBMZ   .res Assessment: Plan:   Discussed potential benefits, risks, and side effects of increasing Abilify to 20 mg po qd to improve mood since Abilify has been most effective for pt's depression. Discussed that anxiety may also improve with higher dose of Abilify.  Continue Wellbutrin XL 300 mg po q am for depression. Continue Propranolol 10 mg po qd for anxiety and tremor. Continue Hydroxyzine 50 mg prn anxiety and insomnia. Continue Gabapentin 400 mg po BID for anxiety.  Bipolar 2 disorder, major depressive episode (HCC) - Plan: ARIPiprazole (ABILIFY) 20 MG  tablet, buPROPion (WELLBUTRIN XL) 300 MG 24 hr tablet  Anxiety disorder, unspecified type - Plan: propranolol (INDERAL) 10 MG tablet, hydrOXYzine (VISTARIL) 50 MG capsule, gabapentin (NEURONTIN) 400 MG capsule, DISCONTINUED: gabapentin (NEURONTIN) 400 MG capsule  Alcohol dependence, in remission (HCC)  Please see After Visit Summary for patient specific instructions.  Future Appointments  Date Time Provider Department Center  04/27/2018  1:30 PM [name], PMHNP [facility] None    No orders of the defined types were placed in this encounter.     -------------------------------

## 2018-04-03 ENCOUNTER — Encounter: Payer: Self-pay | Admitting: Psychiatry

## 2018-04-03 ENCOUNTER — Ambulatory Visit: Payer: Self-pay | Admitting: Psychiatry

## 2018-04-03 VITALS — BP 121/85 | HR 63

## 2018-04-03 DIAGNOSIS — F3181 Bipolar II disorder: Secondary | ICD-10-CM

## 2018-04-03 DIAGNOSIS — F419 Anxiety disorder, unspecified: Secondary | ICD-10-CM

## 2018-04-03 DIAGNOSIS — F1021 Alcohol dependence, in remission: Secondary | ICD-10-CM

## 2018-04-03 DIAGNOSIS — R400 Somnolence: Secondary | ICD-10-CM

## 2018-04-03 MED ORDER — GABAPENTIN 400 MG PO CAPS: 400.0000 mg | ORAL_CAPSULE | Freq: Two times a day (BID) | ORAL | 1 refills | Status: DC

## 2018-04-03 MED ORDER — MODAFINIL 200 MG PO TABS: ORAL_TABLET | ORAL | 0 refills | Status: DC

## 2018-04-03 MED ORDER — PROPRANOLOL HCL 10 MG PO TABS: 10.0000 mg | ORAL_TABLET | Freq: Every day | ORAL | 1 refills | Status: DC

## 2018-04-03 MED ORDER — HYDROXYZINE PAMOATE 50 MG PO CAPS: 50.0000 mg | ORAL_CAPSULE | Freq: Four times a day (QID) | ORAL | 1 refills | Status: AC | PRN

## 2018-04-03 MED ORDER — BUPROPION HCL ER (XL) 300 MG PO TB24: 300.0000 mg | ORAL_TABLET | Freq: Every day | ORAL | 1 refills | Status: DC

## 2018-04-03 MED ORDER — ARIPIPRAZOLE 20 MG PO TABS: 20.0000 mg | ORAL_TABLET | Freq: Every day | ORAL | 1 refills | Status: DC

## 2018-04-03 NOTE — Patient Instructions (Signed)
Consider [facility] for therapy on a sliding scale.  [phone] [email or web address]

## 2018-04-03 NOTE — Progress Notes (Signed)
Nicholas LoanMarcus Hardage [id number] 07/12/1970 48 y.o.  Subjective:   Patient ID:  Nicholas Melton is a 48 y.o. (DOB 02/07/1971) male.  Chief Complaint:  Chief Complaint  Patient presents with  . Depression  . Anxiety    HPI Nicholas LoanMarcus Mattera presents to the office today for follow-up of worsening depression and anixety. He reports that he had severe mood and anxiety s/s on New Year's Eve and reports that he did not go into work and did not call into work despite being dressed and ready to go. Recalls feeling, "I'm just done." Reports that he later communicated what was going on with his supervisor, who was supportive. He reports that he has "been in a funk." Reports that he has been consistent with taking medications. Reports that he has been having severe financial stress. Reports that at times he is giving up shifts when someone is looking to pick up another shift due to low energy and low motivation. Reports that depression was severe 2 weeks ago and has improved slightly since that time. He reports excessive sleep and "I sleep as much as I possibly can" and at least 10 hours a day. Has also had increased anxiety. Had panic attack a couple of weeks ago in his car before going to work and "I didn't want to go in." Describes appetite as "kind of random" and will crave junk food. Energy and motivation have been very low. Reports concentration is difficult. Reports that he has had a few nights where he could not go to bed and felt "wired." Denies any impulsivity other than spending a small amount of money on a few things that he did not need. Denies any recent ETOH use. Denies SI.   Reports that he has not been able to see therapist due to financial constraints.   Past medication trials: Seroquel-allergic reaction Zyprexa- multiple side effects Risperdal-dissociation Rexulti-felt disoriented Advertising copywriterLatuda Vraylar Abilify-helpful for depression Depakote Lamictal-adverse reaction Lithium-lethargic,  irritability Gabapentin-helpful for anxiety Trileptal-lethargic, joint pain, back pain BuSpar-adverse reaction Vistaril Anafranil Mirtazapine-helpful for sleep and night terrors Effexor Paxil Prozac- sexual side effects Wellbutrin-helpful for depression Naltrexone-nausea  Review of Systems:  Review of Systems  Musculoskeletal: Negative for gait problem.  Neurological: Negative for tremors.       Reports tremor has been well controlled.   Psychiatric/Behavioral:       Please refer to HPI    Medications: I have reviewed the patient's current medications.  Current Outpatient Medications  Medication Sig Dispense Refill  . ARIPiprazole (ABILIFY) 20 MG tablet Take 1 tablet (20 mg total) by mouth daily. 90 tablet 1  . buPROPion (WELLBUTRIN XL) 300 MG 24 hr tablet Take 1 tablet (300 mg total) by mouth daily. 90 tablet 1  . gabapentin (NEURONTIN) 400 MG capsule Take 1 capsule (400 mg total) by mouth 2 (two) times daily. 180 capsule 1  . hydrOXYzine (VISTARIL) 50 MG capsule Take 1 capsule (50 mg total) by mouth every 6 (six) hours as needed. 270 capsule 1  . propranolol (INDERAL) 10 MG tablet Take 1 tablet (10 mg total) by mouth daily. 90 tablet 1  . modafinil (PROVIGIL) 200 MG tablet Take 1/2 tab 1-2 times daily 5 tablet 0   No current facility-administered medications for this visit.     Medication Side Effects: None  Allergies:  Allergies  Allergen Reactions  . Risperidone And Related Other (See Comments)    Faint, dizzy, lethargic   . Seroquel [Quetiapine] Other (See Comments)    Faint, dizzy, lethargic   .  Depakote Er [Divalproex Sodium Er] Diarrhea  . Ambien [Zolpidem Tartrate] Other (See Comments)    Hallucinations   . Lamictal [Lamotrigine] Nausea And Vomiting    Past Medical History:  Diagnosis Date  . Anxiety   . Bipolar 2 disorder (HCC)   . Depression   . Hypertension   . IBS (irritable bowel syndrome)   . Night terrors, adult   . OCD (obsessive compulsive  disorder)   . Panic attacks   . Posttraumatic stress disorder   . Social phobia     Family History  Problem Relation Age of Onset  . Cancer Brother   . Drug abuse Paternal Uncle   . Alcohol abuse Paternal Grandfather     Social History   Socioeconomic History  . Marital status: Married    Spouse name: Not on file  . Number of children: Not on file  . Years of education: Not on file  . Highest education level: Not on file  Occupational History  . Not on file  Social Needs  . Financial resource strain: Not on file  . Food insecurity:    Worry: Not on file    Inability: Not on file  . Transportation needs:    Medical: Not on file    Non-medical: Not on file  Tobacco Use  . Smoking status: Current Every Day Smoker    Packs/day: 2.00    Years: 15.00    Pack years: 30.00    Types: Cigarettes  . Smokeless tobacco: Former Engineer, water and Sexual Activity  . Alcohol use: Not Currently    Alcohol/week: 0.0 standard drinks  . Drug use: Not Currently  . Sexual activity: Yes  Lifestyle  . Physical activity:    Days per week: Not on file    Minutes per session: Not on file  . Stress: Not on file  Relationships  . Social connections:    Talks on phone: Not on file    Gets together: Not on file    Attends religious service: Not on file    Active member of club or organization: Not on file    Attends meetings of clubs or organizations: Not on file    Relationship status: Not on file  . Intimate partner violence:    Fear of current or ex partner: Not on file    Emotionally abused: Not on file    Physically abused: Not on file    Forced sexual activity: Not on file  Other Topics Concern  . Not on file  Social History Narrative  . Not on file    Past Medical History, Surgical history, Social history, and Family history were reviewed and updated as appropriate.   Please see review of systems for further details on the patient's review from today.   Objective:    Physical Exam:  BP 121/85   Pulse 63   Physical Exam Constitutional:      General: He is not in acute distress.    Appearance: He is well-developed.  Musculoskeletal:        General: No deformity.  Neurological:     Mental Status: He is alert and oriented to person, place, and time.     Coordination: Coordination normal.  Psychiatric:        Mood and Affect: Mood is depressed. Mood is not anxious. Affect is blunt. Affect is not labile, angry or inappropriate.        Speech: Speech normal.  Behavior: Behavior normal.        Thought Content: Thought content normal. Thought content does not include homicidal or suicidal ideation. Thought content does not include homicidal or suicidal plan.        Judgment: Judgment normal.     Comments: Insight intact. No auditory or visual hallucinations. No delusions.      Lab Review:     Component Value Date/Time   NA 136 11/10/2017 2052   K 3.7 11/10/2017 2052   CL 100 11/10/2017 2052   CO2 23 11/10/2017 2052   GLUCOSE 72 11/10/2017 2052   BUN 8 11/10/2017 2052   CREATININE 0.61 11/10/2017 2052   CALCIUM 9.0 11/10/2017 2052   PROT 5.5 (L) 01/23/2016 0242   ALBUMIN 3.7 01/23/2016 0242   AST 25 01/23/2016 0242   ALT 19 01/23/2016 0242   ALKPHOS 42 01/23/2016 0242   BILITOT 1.4 (H) 01/23/2016 0242   GFRNONAA >60 11/10/2017 2052   GFRAA >60 11/10/2017 2052       Component Value Date/Time   WBC 7.1 11/10/2017 2052   RBC 4.58 11/10/2017 2052   HGB 15.6 11/10/2017 2052   HCT 44.5 11/10/2017 2052   PLT 275 11/10/2017 2052   MCV 97.2 11/10/2017 2052   MCH 34.1 (H) 11/10/2017 2052   MCHC 35.1 11/10/2017 2052   RDW 13.8 11/10/2017 2052   LYMPHSABS 3.1 11/10/2017 2052   MONOABS 0.5 11/10/2017 2052   EOSABS 0.2 11/10/2017 2052   BASOSABS 0.0 11/10/2017 2052    No results found for: POCLITH, LITHIUM   No results found for: PHENYTOIN, PHENOBARB, VALPROATE, CBMZ   .res Assessment: Plan:   Discussed potential benefits,  risks, and side effects of possible treatment options for depression and excessive daytime somnolence to include trial of Modafanil or increasing Wellbutrin XL to 450 mg po qd. Pt would like to start trial of Modafanil. Will send limited supply for trial of Modafanil and advised pt to contact office if this is effective and well tolerated and additional script can be sent. Discussed starting with Modafanil 200 mg 1/2 tab po q am for 1-2 days, then increasing to 100 mg BID if only effective for several hours or increasing to 200 mg po q am if not effective and well tolerated.  Discussed considering increase in Wellbtrin XL to 450 mg qd if Modafanil is ineffective or not well tolerated.  Continue all other medications for mood, anxiety, and insomnia.   Bipolar 2 disorder, major depressive episode (HCC) - Plan: modafinil (PROVIGIL) 200 MG tablet, ARIPiprazole (ABILIFY) 20 MG tablet, buPROPion (WELLBUTRIN XL) 300 MG 24 hr tablet  Anxiety disorder, unspecified type - Plan: gabapentin (NEURONTIN) 400 MG capsule, hydrOXYzine (VISTARIL) 50 MG capsule, propranolol (INDERAL) 10 MG tablet  Alcohol dependence, in remission (HCC)  Daytime somnolence - Plan: modafinil (PROVIGIL) 200 MG tablet  Please see After Visit Summary for patient specific instructions.  Future Appointments  Date Time Provider Department Center  04/27/2018  1:30 PM Corie Chiquitoarter, Glorious Flicker, PMHNP [facility] None    No orders of the defined types were placed in this encounter.     -------------------------------

## 2018-04-04 ENCOUNTER — Encounter: Payer: Self-pay | Admitting: Psychiatry

## 2018-04-27 ENCOUNTER — Ambulatory Visit: Payer: Self-pay | Admitting: Psychiatry

## 2018-07-03 ENCOUNTER — Ambulatory Visit: Payer: Self-pay | Admitting: Psychiatry

## 2018-07-13 ENCOUNTER — Other Ambulatory Visit: Payer: Self-pay

## 2018-07-13 ENCOUNTER — Ambulatory Visit (INDEPENDENT_AMBULATORY_CARE_PROVIDER_SITE_OTHER): Payer: Self-pay | Admitting: Psychiatry

## 2018-07-13 ENCOUNTER — Encounter: Payer: Self-pay | Admitting: Psychiatry

## 2018-07-13 DIAGNOSIS — F419 Anxiety disorder, unspecified: Secondary | ICD-10-CM

## 2018-07-13 DIAGNOSIS — F3181 Bipolar II disorder: Secondary | ICD-10-CM

## 2018-07-13 MED ORDER — GABAPENTIN 400 MG PO CAPS: 400.0000 mg | ORAL_CAPSULE | Freq: Two times a day (BID) | ORAL | 1 refills | Status: DC

## 2018-07-13 MED ORDER — PROPRANOLOL HCL 10 MG PO TABS: 10.0000 mg | ORAL_TABLET | Freq: Every day | ORAL | 1 refills | Status: DC

## 2018-07-13 MED ORDER — BUPROPION HCL ER (XL) 300 MG PO TB24: 300.0000 mg | ORAL_TABLET | Freq: Every day | ORAL | 1 refills | Status: DC

## 2018-07-13 MED ORDER — SILDENAFIL CITRATE 50 MG PO TABS: 50.0000 mg | ORAL_TABLET | Freq: Every day | ORAL | 2 refills | Status: DC | PRN

## 2018-07-13 MED ORDER — ARIPIPRAZOLE 20 MG PO TABS: 20.0000 mg | ORAL_TABLET | Freq: Every day | ORAL | 1 refills | Status: DC

## 2018-07-13 NOTE — Progress Notes (Signed)
Nicholas Melton [id number] Jul 07, 1970 48 y.o.  Virtual Visit via Telephone Note  I connected with@ on 07/13/18 at  4:00 PM EDT by telephone and verified that I am speaking with the correct person using two identifiers.   I discussed the limitations, risks, security and privacy concerns of performing an evaluation and management service by telephone and the availability of in person appointments. I also discussed with the patient that there may be a patient responsible charge related to this service. The patient expressed understanding and agreed to proceed.   I discussed the assessment and treatment plan with the patient. The patient was provided an opportunity to ask questions and all were answered. The patient agreed with the plan and demonstrated an understanding of the instructions.   The patient was advised to call back or seek an in-person evaluation if the symptoms worsen or if the condition fails to improve as anticipated.  I provided 30 minutes of non-face-to-face time during this encounter.  The patient was located at home.  The provider was located at home.   [name], PMHNP   Subjective:   Patient ID:  Nicholas Melton is a 48 y.o. (DOB Jan 25, 1971) male.  Chief Complaint:  Chief Complaint  Patient presents with  . Follow-up    h/o anxiety, depression, mood instability    HPI Nicholas Melton presents for follow-up of anxiety and mood instability. He reports that his anxiety has been elevated in response to pandemic. He reports that he has had a few panic attacks. Reports that he has has some worry r/t the pandemic. He reports some occ depression. Reports minimal irritability. He reports that he has been sleeping more and averaging 9-10 hours in a 24-hour period. He reports that his sleep-wake cycle has been stable. Low energy and motivation. He reports that concentration has been impaired. Denies SI- "just the opposite" and worry about having COVID 19.   He reports that he has  been having minimal cravings to drink.Denies any drinking. He reports that he had some manic s/s a few weeks ago for a period of several hours.   Past medication trials: Seroquel-allergic reaction Zyprexa- multiple side effects Risperdal-dissociation Rexulti-felt disoriented Advertising copywriter Abilify-helpful for depression Depakote Lamictal-adverse reaction Lithium-lethargic, irritability Gabapentin-helpful for anxiety Trileptal-lethargic, joint pain, back pain BuSpar-adverse reaction Vistaril Anafranil Mirtazapine-helpful for sleep and night terrors Effexor Paxil Prozac- sexual side effects Wellbutrin-helpful for depression Naltrexone-nausea Modafanil- jittery. Intolerable side effects.   Review of Systems:  Review of Systems  Musculoskeletal: Negative for gait problem.  Neurological: Negative for tremors.  Psychiatric/Behavioral:       Please refer to HPI    Medications: I have reviewed the patient's current medications.  Current Outpatient Medications  Medication Sig Dispense Refill  . b complex vitamins tablet Take 1 tablet by mouth daily.    . ARIPiprazole (ABILIFY) 20 MG tablet Take 1 tablet (20 mg total) by mouth daily. 90 tablet 1  . buPROPion (WELLBUTRIN XL) 300 MG 24 hr tablet Take 1 tablet (300 mg total) by mouth daily. 90 tablet 1  . gabapentin (NEURONTIN) 400 MG capsule Take 1 capsule (400 mg total) by mouth 2 (two) times daily. 180 capsule 1  . propranolol (INDERAL) 10 MG tablet Take 1 tablet (10 mg total) by mouth daily. 90 tablet 1  . sildenafil (VIAGRA) 50 MG tablet Take 1 tablet (50 mg total) by mouth daily as needed for erectile dysfunction. 30 tablet 2   No current facility-administered medications for this visit.  Medication Side Effects: Other: Sexual side effects.  Allergies:  Allergies  Allergen Reactions  . Risperidone And Related Other (See Comments)    Faint, dizzy, lethargic   . Seroquel [Quetiapine] Other (See Comments)    Faint,  dizzy, lethargic   . Depakote Er [Divalproex Sodium Er] Diarrhea  . Ambien [Zolpidem Tartrate] Other (See Comments)    Hallucinations   . Lamictal [Lamotrigine] Nausea And Vomiting    Past Medical History:  Diagnosis Date  . Anxiety   . Bipolar 2 disorder (HCC)   . Depression   . Hypertension   . IBS (irritable bowel syndrome)   . Night terrors, adult   . OCD (obsessive compulsive disorder)   . Panic attacks   . Posttraumatic stress disorder   . Social phobia     Family History  Problem Relation Age of Onset  . Cancer Brother   . Drug abuse Paternal Uncle   . Alcohol abuse Paternal Grandfather     Social History   Socioeconomic History  . Marital status: Married    Spouse name: Not on file  . Number of children: Not on file  . Years of education: Not on file  . Highest education level: Not on file  Occupational History  . Not on file  Social Needs  . Financial resource strain: Not on file  . Food insecurity:    Worry: Not on file    Inability: Not on file  . Transportation needs:    Medical: Not on file    Non-medical: Not on file  Tobacco Use  . Smoking status: Current Every Day Smoker    Packs/day: 2.00    Years: 15.00    Pack years: 30.00    Types: Cigarettes  . Smokeless tobacco: Former Engineer, water and Sexual Activity  . Alcohol use: Not Currently    Alcohol/week: 0.0 standard drinks  . Drug use: Not Currently  . Sexual activity: Yes  Lifestyle  . Physical activity:    Days per week: Not on file    Minutes per session: Not on file  . Stress: Not on file  Relationships  . Social connections:    Talks on phone: Not on file    Gets together: Not on file    Attends religious service: Not on file    Active member of club or organization: Not on file    Attends meetings of clubs or organizations: Not on file    Relationship status: Not on file  . Intimate partner violence:    Fear of current or ex partner: Not on file    Emotionally abused:  Not on file    Physically abused: Not on file    Forced sexual activity: Not on file  Other Topics Concern  . Not on file  Social History Narrative  . Not on file    Past Medical History, Surgical history, Social history, and Family history were reviewed and updated as appropriate.   Please see review of systems for further details on the patient's review from today.   Objective:   Physical Exam:  There were no vitals taken for this visit.  Physical Exam Neurological:     Mental Status: He is alert and oriented to person, place, and time.     Cranial Nerves: No dysarthria.  Psychiatric:        Attention and Perception: Attention normal.        Speech: Speech normal.        Behavior:  Behavior is cooperative.        Thought Content: Thought content normal. Thought content is not paranoid or delusional. Thought content does not include homicidal or suicidal ideation. Thought content does not include homicidal or suicidal plan.        Cognition and Memory: Cognition and memory normal.        Judgment: Judgment normal.     Comments: Mood is mildly depressed.  Mood appropriate to content.  Affect congruent with     Lab Review:     Component Value Date/Time   NA 136 11/10/2017 2052   K 3.7 11/10/2017 2052   CL 100 11/10/2017 2052   CO2 23 11/10/2017 2052   GLUCOSE 72 11/10/2017 2052   BUN 8 11/10/2017 2052   CREATININE 0.61 11/10/2017 2052   CALCIUM 9.0 11/10/2017 2052   PROT 5.5 (L) 01/23/2016 0242   ALBUMIN 3.7 01/23/2016 0242   AST 25 01/23/2016 0242   ALT 19 01/23/2016 0242   ALKPHOS 42 01/23/2016 0242   BILITOT 1.4 (H) 01/23/2016 0242   GFRNONAA >60 11/10/2017 2052   GFRAA >60 11/10/2017 2052       Component Value Date/Time   WBC 7.1 11/10/2017 2052   RBC 4.58 11/10/2017 2052   HGB 15.6 11/10/2017 2052   HCT 44.5 11/10/2017 2052   PLT 275 11/10/2017 2052   MCV 97.2 11/10/2017 2052   MCH 34.1 (H) 11/10/2017 2052   MCHC 35.1 11/10/2017 2052   RDW 13.8  11/10/2017 2052   LYMPHSABS 3.1 11/10/2017 2052   MONOABS 0.5 11/10/2017 2052   EOSABS 0.2 11/10/2017 2052   BASOSABS 0.0 11/10/2017 2052    No results found for: POCLITH, LITHIUM   No results found for: PHENYTOIN, PHENOBARB, VALPROATE, CBMZ   .res Assessment: Plan:   Discussed potential benefits, risk, and side effects of Viagra to target sexual dysfunction, possibly due to medication side effects. Continue Abilify 20 mg daily for mood signs and symptoms Continue Wellbutrin XL 300 mg daily for depression. Continue gabapentin 400 mg twice daily for anxiety. Continue propanolol 10 mg daily for anxiety and tremor. Patient to follow-up with this provider in 3 months or sooner if clinically indicated. Patient advised to contact office with any questions, adverse effects, or acute worsening in signs and symptoms.  Bipolar 2 disorder, major depressive episode (HCC) - Plan: ARIPiprazole (ABILIFY) 20 MG tablet, buPROPion (WELLBUTRIN XL) 300 MG 24 hr tablet  Anxiety disorder, unspecified type - Plan: gabapentin (NEURONTIN) 400 MG capsule, propranolol (INDERAL) 10 MG tablet  Please see After Visit Summary for patient specific instructions.  No future appointments.  No orders of the defined types were placed in this encounter.     -------------------------------

## 2018-11-13 ENCOUNTER — Ambulatory Visit (INDEPENDENT_AMBULATORY_CARE_PROVIDER_SITE_OTHER): Payer: Self-pay | Admitting: Psychiatry

## 2018-11-13 ENCOUNTER — Other Ambulatory Visit: Payer: Self-pay

## 2018-11-13 ENCOUNTER — Encounter: Payer: Self-pay | Admitting: Psychiatry

## 2018-11-13 DIAGNOSIS — F419 Anxiety disorder, unspecified: Secondary | ICD-10-CM

## 2018-11-13 DIAGNOSIS — F1021 Alcohol dependence, in remission: Secondary | ICD-10-CM

## 2018-11-13 DIAGNOSIS — F3181 Bipolar II disorder: Secondary | ICD-10-CM

## 2018-11-13 MED ORDER — PROPRANOLOL HCL 10 MG PO TABS: 10.0000 mg | ORAL_TABLET | Freq: Two times a day (BID) | ORAL | 1 refills | Status: DC | PRN

## 2018-11-13 MED ORDER — ARIPIPRAZOLE 20 MG PO TABS: 20.0000 mg | ORAL_TABLET | Freq: Every day | ORAL | 1 refills | Status: DC

## 2018-11-13 MED ORDER — BUPROPION HCL ER (XL) 300 MG PO TB24: 300.0000 mg | ORAL_TABLET | Freq: Every day | ORAL | 1 refills | Status: DC

## 2018-11-13 MED ORDER — GABAPENTIN 400 MG PO CAPS: 400.0000 mg | ORAL_CAPSULE | Freq: Three times a day (TID) | ORAL | 1 refills | Status: DC
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
## 2019-05-22 ENCOUNTER — Encounter: Payer: Self-pay | Admitting: Psychiatry

## 2019-05-22 ENCOUNTER — Ambulatory Visit (INDEPENDENT_AMBULATORY_CARE_PROVIDER_SITE_OTHER): Payer: Self-pay | Admitting: Psychiatry

## 2019-05-22 VITALS — Wt 165.0 lb

## 2019-05-22 DIAGNOSIS — F419 Anxiety disorder, unspecified: Secondary | ICD-10-CM

## 2019-05-22 DIAGNOSIS — F3181 Bipolar II disorder: Secondary | ICD-10-CM

## 2019-05-22 DIAGNOSIS — F1021 Alcohol dependence, in remission: Secondary | ICD-10-CM

## 2019-05-22 MED ORDER — GABAPENTIN 400 MG PO CAPS: 400.0000 mg | ORAL_CAPSULE | Freq: Three times a day (TID) | ORAL | 1 refills | Status: DC

## 2019-05-22 MED ORDER — ARIPIPRAZOLE 20 MG PO TABS: 20.0000 mg | ORAL_TABLET | Freq: Every day | ORAL | 1 refills | Status: DC

## 2019-05-22 MED ORDER — BUPROPION HCL ER (XL) 300 MG PO TB24: 300.0000 mg | ORAL_TABLET | Freq: Every day | ORAL | 1 refills | Status: DC

## 2019-05-22 MED ORDER — PROPRANOLOL HCL 10 MG PO TABS: 10.0000 mg | ORAL_TABLET | Freq: Two times a day (BID) | ORAL | 1 refills | Status: DC | PRN

## 2019-05-22 NOTE — Progress Notes (Signed)
Nicholas Melton [id number] 1970/12/19 49 y.o.  Virtual Visit via Video Note  I connected with pt @ on 05/22/19 at  4:00 PM EST by a video enabled telemedicine application and verified that I am speaking with the correct person using two identifiers.   I discussed the limitations of evaluation and management by telemedicine and the availability of in person appointments. The patient expressed understanding and agreed to proceed.  I discussed the assessment and treatment plan with the patient. The patient was provided an opportunity to ask questions and all were answered. The patient agreed with the plan and demonstrated an understanding of the instructions.   The patient was advised to call back or seek an in-person evaluation if the symptoms worsen or if the condition fails to improve as anticipated.  I provided 10 minutes of non-face-to-face time during this encounter.  The patient was located at home.  The provider was located at [facility].   [name], PMHNP   Subjective:   Patient ID:  Nicholas Melton is a 49 y.o. (DOB Jul 02, 1970) male.  Chief Complaint:  Chief Complaint  Patient presents with  . Follow-up    h/o depression and anixety    HPI Nicholas Melton presents for follow-up of mood and anxiety. He reports that he had several temporary jobs and has a job that he likes. Has been there for 3.5 weeks. He has been taking orders for a warehouse. He reports that he had a panic attack during a work meeting. He reports that his anxiety has been "ok." He reports that mood has been "pretty good." He reports that he and his wife have had some arguments and that he has been quicker to anger and more easily frustrated. He reports that money is a source of frustration and anxiety. He denies depressed mood in the last 3 weeks. He reports that he had some increased depression about a month ago when he was without work. He reports that he may have had 1-2 episodes of mania and his wife  has commented on this. He reports that he had one sleepless night a few weeks ago. Typically sleeping about 7 hours a night. Appetite has been stable. Eating regular meals. Energy has been ok. Motivation has been better since starting current job. Concentration is fair. He reports that he has had a few incidences where it was difficult to focus. Cannot recall impulsive or risky behavior. Denies SI.   Denies any recent ETOH use.   Past medication trials: Seroquel-allergic reaction Zyprexa-multiple side effects Risperdal-dissociation Rexulti-felt disoriented Advertising copywriter Abilify-helpful for depression Depakote Lamictal-adverse reaction Lithium-lethargic, irritability Gabapentin-helpful for anxiety Trileptal-lethargic, joint pain, back pain BuSpar-adverse reaction Vistaril Anafranil Mirtazapine-helpful for sleep and night terrors Effexor Paxil Prozac- sexual side effects Wellbutrin-helpful for depression Naltrexone-nausea Modafanil- jittery. Intolerable side effects. Propranolol- some benefit with anxiety.   Review of Systems:  Review of Systems  Musculoskeletal: Positive for back pain. Negative for gait problem.       Has had back spasms.   Neurological: Negative for dizziness and tremors.       Denies any involuntary movements.   Psychiatric/Behavioral:       Please refer to HPI    Medications: I have reviewed the patient's current medications.  Current Outpatient Medications  Medication Sig Dispense Refill  . sildenafil (VIAGRA) 50 MG tablet Take 1 tablet (50 mg total) by mouth daily as needed for erectile dysfunction. 30 tablet 2  . ARIPiprazole (ABILIFY) 20 MG tablet Take 1 tablet (20 mg  total) by mouth daily. 90 tablet 1  . buPROPion (WELLBUTRIN XL) 300 MG 24 hr tablet Take 1 tablet (300 mg total) by mouth daily. 90 tablet 1  . gabapentin (NEURONTIN) 400 MG capsule Take 1 capsule (400 mg total) by mouth 3 (three) times daily. 270 capsule 1  . propranolol  (INDERAL) 10 MG tablet Take 1 tablet (10 mg total) by mouth 2 (two) times daily as needed. 90 tablet 1   No current facility-administered medications for this visit.    Medication Side Effects: None  Allergies:  Allergies  Allergen Reactions  . Risperidone And Related Other (See Comments)    Faint, dizzy, lethargic   . Seroquel [Quetiapine] Other (See Comments)    Faint, dizzy, lethargic   . Depakote Er [Divalproex Sodium Er] Diarrhea  . Ambien [Zolpidem Tartrate] Other (See Comments)    Hallucinations   . Lamictal [Lamotrigine] Nausea And Vomiting    Past Medical History:  Diagnosis Date  . Anxiety   . Bipolar 2 disorder (HCC)   . Depression   . Hypertension   . IBS (irritable bowel syndrome)   . Night terrors, adult   . OCD (obsessive compulsive disorder)   . Panic attacks   . Posttraumatic stress disorder   . Social phobia     Family History  Problem Relation Age of Onset  . Cancer Brother   . Drug abuse Paternal Uncle   . Alcohol abuse Paternal Grandfather     Social History   Socioeconomic History  . Marital status: Married    Spouse name: Not on file  . Number of children: Not on file  . Years of education: Not on file  . Highest education level: Not on file  Occupational History  . Not on file  Tobacco Use  . Smoking status: Current Every Day Smoker    Packs/day: 2.00    Years: 15.00    Pack years: 30.00    Types: Cigarettes  . Smokeless tobacco: Former Engineer, water and Sexual Activity  . Alcohol use: Not Currently    Alcohol/week: 0.0 standard drinks  . Drug use: Not Currently  . Sexual activity: Yes  Other Topics Concern  . Not on file  Social History Narrative  . Not on file   Social Determinants of Health   Financial Resource Strain:   . Difficulty of Paying Living Expenses: Not on file  Food Insecurity:   . Worried About Programme researcher, broadcasting/film/video in the Last Year: Not on file  . Ran Out of Food in the Last Year: Not on file   Transportation Needs:   . Lack of Transportation (Medical): Not on file  . Lack of Transportation (Non-Medical): Not on file  Physical Activity:   . Days of Exercise per Week: Not on file  . Minutes of Exercise per Session: Not on file  Stress:   . Feeling of Stress : Not on file  Social Connections:   . Frequency of Communication with Friends and Family: Not on file  . Frequency of Social Gatherings with Friends and Family: Not on file  . Attends Religious Services: Not on file  . Active Member of Clubs or Organizations: Not on file  . Attends Banker Meetings: Not on file  . Marital Status: Not on file  Intimate Partner Violence:   . Fear of Current or Ex-Partner: Not on file  . Emotionally Abused: Not on file  . Physically Abused: Not on file  . Sexually Abused:  Not on file    Past Medical History, Surgical history, Social history, and Family history were reviewed and updated as appropriate.   Please see review of systems for further details on the patient's review from today.   Objective:   Physical Exam:  Wt 165 lb (74.8 kg)   BMI 22.38 kg/m   Physical Exam Neurological:     Mental Status: He is alert and oriented to person, place, and time.     Cranial Nerves: No dysarthria.  Psychiatric:        Attention and Perception: Attention and perception normal.        Mood and Affect: Mood normal.        Speech: Speech normal.        Behavior: Behavior is cooperative.        Thought Content: Thought content normal. Thought content is not paranoid or delusional. Thought content does not include homicidal or suicidal ideation. Thought content does not include homicidal or suicidal plan.        Cognition and Memory: Cognition and memory normal.        Judgment: Judgment normal.     Comments: Insight intact     Lab Review:     Component Value Date/Time   NA 136 11/10/2017 2052   K 3.7 11/10/2017 2052   CL 100 11/10/2017 2052   CO2 23 11/10/2017 2052    GLUCOSE 72 11/10/2017 2052   BUN 8 11/10/2017 2052   CREATININE 0.61 11/10/2017 2052   CALCIUM 9.0 11/10/2017 2052   PROT 5.5 (L) 01/23/2016 0242   ALBUMIN 3.7 01/23/2016 0242   AST 25 01/23/2016 0242   ALT 19 01/23/2016 0242   ALKPHOS 42 01/23/2016 0242   BILITOT 1.4 (H) 01/23/2016 0242   GFRNONAA >60 11/10/2017 2052   GFRAA >60 11/10/2017 2052       Component Value Date/Time   WBC 7.1 11/10/2017 2052   RBC 4.58 11/10/2017 2052   HGB 15.6 11/10/2017 2052   HCT 44.5 11/10/2017 2052   PLT 275 11/10/2017 2052   MCV 97.2 11/10/2017 2052   MCH 34.1 (H) 11/10/2017 2052   MCHC 35.1 11/10/2017 2052   RDW 13.8 11/10/2017 2052   LYMPHSABS 3.1 11/10/2017 2052   MONOABS 0.5 11/10/2017 2052   EOSABS 0.2 11/10/2017 2052   BASOSABS 0.0 11/10/2017 2052    No results found for: POCLITH, LITHIUM   No results found for: PHENYTOIN, PHENOBARB, VALPROATE, CBMZ   .res Assessment: Plan:   Will continue current plan of care since target signs and symptoms are well controlled without any tolerability issues. Continue Abilify 20 mg daily for mood signs and symptoms. Continue Wellbutrin XL 300 mg daily for depression. Continue gabapentin 400 mg 3 times daily for anxiety. Continue propanolol as needed for anxiety. Pt to f/u in 6 months or sooner if clinically indicated.  Patient advised to contact office with any questions, adverse effects, or acute worsening in signs and symptoms.  Nicholas Melton was seen today for follow-up.  Diagnoses and all orders for this visit:  Bipolar 2 disorder (HCC) -     ARIPiprazole (ABILIFY) 20 MG tablet; Take 1 tablet (20 mg total) by mouth daily. -     buPROPion (WELLBUTRIN XL) 300 MG 24 hr tablet; Take 1 tablet (300 mg total) by mouth daily.  Anxiety disorder, unspecified type -     gabapentin (NEURONTIN) 400 MG capsule; Take 1 capsule (400 mg total) by mouth 3 (three) times daily. -  propranolol (INDERAL) 10 MG tablet; Take 1 tablet (10 mg total) by mouth 2  (two) times daily as needed.  Alcohol dependence, in remission [facility])     Please see After Visit Summary for patient specific instructions.  No future appointments.  No orders of the defined types were placed in this encounter.     -------------------------------

## 2019-06-07 ENCOUNTER — Ambulatory Visit: Payer: Self-pay | Attending: Internal Medicine

## 2019-06-07 DIAGNOSIS — Z23 Encounter for immunization: Secondary | ICD-10-CM

## 2019-06-07 NOTE — Progress Notes (Signed)
   Covid-19 Vaccination Clinic  Name:  Jerrell Mangel    MRN: [id number] DOB: Apr 09, 1970  06/07/2019  Mr. Lotz was observed post Covid-19 immunization for 15 minutes without incident. He was provided with Vaccine Information Sheet and instruction to access the V-Safe system.   Mr. Knack was instructed to call 911 with any severe reactions post vaccine: Marland Kitchen Difficulty breathing  . Swelling of face and throat  . A fast heartbeat  . A bad rash all over body  . Dizziness and weakness   Immunizations Administered    Name Date Dose VIS Date Route   Pfizer COVID-19 Vaccine 06/07/2019  4:41 PM 0.3 mL 02/23/2019 Intramuscular   Manufacturer: [employer], [employer]   Lot: [id number]   NDC: [address]-9

## 2019-07-02 ENCOUNTER — Ambulatory Visit: Payer: Self-pay | Attending: Internal Medicine

## 2019-07-02 DIAGNOSIS — Z23 Encounter for immunization: Secondary | ICD-10-CM

## 2019-07-02 NOTE — Progress Notes (Signed)
   Covid-19 Vaccination Clinic  Name:  Nicholas Melton    MRN: [id number] DOB: [birth year]  07/02/2019  Nicholas Melton was observed post Covid-19 immunization for 15 minutes without incident. He was provided with Vaccine Information Sheet and instruction to access the V-Safe system.   Nicholas Melton was instructed to call 911 with any severe reactions post vaccine: Marland Kitchen Difficulty breathing  . Swelling of face and throat  . A fast heartbeat  . A bad rash all over body  . Dizziness and weakness   Immunizations Administered    Name Date Dose VIS Date Route   Pfizer COVID-19 Vaccine 07/02/2019  3:52 PM 0.3 mL 05/09/2018 Intramuscular   Manufacturer: [employer], [employer]   Lot: [id number]   NDC: [address]-2

## 2019-10-02 ENCOUNTER — Other Ambulatory Visit: Payer: Self-pay

## 2019-10-02 ENCOUNTER — Encounter: Payer: Self-pay | Admitting: Psychiatry

## 2019-10-02 ENCOUNTER — Ambulatory Visit (INDEPENDENT_AMBULATORY_CARE_PROVIDER_SITE_OTHER): Payer: Self-pay | Admitting: Psychiatry

## 2019-10-02 VITALS — BP 114/66 | HR 65 | Wt 171.0 lb

## 2019-10-02 DIAGNOSIS — F3181 Bipolar II disorder: Secondary | ICD-10-CM

## 2019-10-02 DIAGNOSIS — F1021 Alcohol dependence, in remission: Secondary | ICD-10-CM

## 2019-10-02 DIAGNOSIS — F419 Anxiety disorder, unspecified: Secondary | ICD-10-CM

## 2019-10-02 MED ORDER — ARIPIPRAZOLE 20 MG PO TABS: 20.0000 mg | ORAL_TABLET | Freq: Every day | ORAL | 1 refills | Status: DC

## 2019-10-02 MED ORDER — CARBAMAZEPINE ER 100 MG PO CP12: ORAL_CAPSULE | ORAL | 0 refills | Status: DC

## 2019-10-02 MED ORDER — PROPRANOLOL HCL 10 MG PO TABS: 10.0000 mg | ORAL_TABLET | Freq: Two times a day (BID) | ORAL | 1 refills | Status: DC | PRN

## 2019-10-02 MED ORDER — GABAPENTIN 400 MG PO CAPS: 400.0000 mg | ORAL_CAPSULE | Freq: Three times a day (TID) | ORAL | 1 refills | Status: DC

## 2019-10-02 MED ORDER — BUPROPION HCL ER (XL) 300 MG PO TB24: 300.0000 mg | ORAL_TABLET | Freq: Every day | ORAL | 1 refills | Status: DC

## 2019-10-02 MED ORDER — SILDENAFIL CITRATE 50 MG PO TABS: 50.0000 mg | ORAL_TABLET | Freq: Every day | ORAL | 2 refills | Status: DC | PRN

## 2019-10-02 NOTE — Progress Notes (Signed)
Nicholas Melton [id number] 1971-03-11 49 y.o.  Subjective:   Patient ID:  Nicholas Melton is a 49 y.o. (DOB 10/06/1970) male.  Chief Complaint:  Chief Complaint  Patient presents with   Anxiety   Depression    HPI Nicholas Melton presents to the office today for follow-up of Bipolar D/O, anxiety, and insomnia. He reports that he has not been working for about 2 months. He reports that he was in a position where he was not comfortable "and rather than talk to them, I just didn't;t go in." He reports a similar experience with another job where "I just didn't show up." He reports that anxiety prevented him from discussing concerns with management and reports anxiety with conflict. He reports that he has not been interacting with family recently- "been kind of in hiding." Has not been talking to parents to avoid discussion about finances. Anxiety has been elevated recently. He reports severe anxiety in some situations without reaching level of panic attack. He reports financial stress. Frequent worry, rumination, and catastrophic thinking. Reports possible intrusive thoughts, such as worry that he is going to get evicted.  He reports mood has been "not all that great." He reports some occasional irritability that can result in fights with his wife. He reports, "I've been pretty depressed." He reports that he has had a few periods of mania where he did not want to go to bed with last episode a week or 2 ago and lasted about one day in duration. Denies risky or impulsive behavior. He reports that he has been sleeping excessively and will sometimes nap for 4-5 hours and then sleep 5-6 hours a night. Energy and motivation have been low. Appetite has been consistent and eating regular meals. He reports difficulty with concentration and focus- "that's why I left the last job... just couldn't focus. Didn't know where to go first... so I just gave up." Denies SI. Reports some vague passive death wishes.  He reports  that he and his wife have been getting along ok.  Denies any recent ETOH use and reports that he has been having increased cravings to use ETOH.  Denies AH or VH. He reports some possible paranoia. He reports that he had belief that landlord was "coming for me" and possibly the police after recent argument with his wife. Had belief that his neighbors were possibly upset and has been avoiding them as a result. He reports that he continues to have some paranoia.   Reports that he would forget to take his medication at times when he was working previous job. AIMS     Office Visit from 10/02/2019 in [facility]  AIMS Total Score 0      Past medication trials: Seroquel-allergic reaction Zyprexa-multiple side effects Risperdal-dissociation Rexulti-felt disoriented Latuda Vraylar Abilify-helpful for depression Depakote Lamictal-adverse reaction Lithium-lethargic, irritability Gabapentin-helpful for anxiety Trileptal-lethargic, joint pain, back pain BuSpar-adverse reaction Vistaril Anafranil Mirtazapine-helpful for sleep and night terrors Effexor Paxil Prozac- sexual side effects Wellbutrin-helpful for depression Naltrexone-nausea Modafanil- jittery. Intolerable side effects. Propranolol- some benefit with anxiety.  Review of Systems:  Review of Systems  Constitutional: Positive for fatigue.  Cardiovascular: Negative for palpitations.  Musculoskeletal: Negative for gait problem.  Neurological: Negative for tremors.  Psychiatric/Behavioral:       Please refer to HPI    Medications: I have reviewed the patient's current medications.  Current Outpatient Medications  Medication Sig Dispense Refill   sildenafil (VIAGRA) 50 MG tablet Take 1 tablet (50 mg total) by mouth daily  as needed for erectile dysfunction. 30 tablet 2   ARIPiprazole (ABILIFY) 20 MG tablet Take 1 tablet (20 mg total) by mouth daily. 90 tablet 1   buPROPion (WELLBUTRIN XL) 300 MG 24 hr  tablet Take 1 tablet (300 mg total) by mouth daily. 90 tablet 1   Carbamazepine (EQUETRO) 100 MG CP12 12 hr capsule Take 1 capsule at bedtime for 3 days, then increase to 2 capsules at bedtime for 3 days, then may increase to 3 capsules at bedtime as tolerated. 120 capsule 0   gabapentin (NEURONTIN) 400 MG capsule Take 1 capsule (400 mg total) by mouth 3 (three) times daily. 270 capsule 1   propranolol (INDERAL) 10 MG tablet Take 1 tablet (10 mg total) by mouth 2 (two) times daily as needed. 90 tablet 1   No current facility-administered medications for this visit.    Medication Side Effects: None  Allergies:  Allergies  Allergen Reactions   Risperidone And Related Other (See Comments)    Faint, dizzy, lethargic    Seroquel [Quetiapine] Other (See Comments)    Faint, dizzy, lethargic    Depakote Er [Divalproex Sodium Er] Diarrhea   Ambien [Zolpidem Tartrate] Other (See Comments)    Hallucinations    Lamictal [Lamotrigine] Nausea And Vomiting    Past Medical History:  Diagnosis Date   Anxiety    Bipolar 2 disorder (HCC)    Depression    Hypertension    IBS (irritable bowel syndrome)    Night terrors, adult    OCD (obsessive compulsive disorder)    Panic attacks    Posttraumatic stress disorder    Social phobia     Family History  Problem Relation Age of Onset   Cancer Brother    Drug abuse Paternal Uncle    Alcohol abuse Paternal Grandfather     Social History   Socioeconomic History   Marital status: Married    Spouse name: Not on file   Number of children: Not on file   Years of education: Not on file   Highest education level: Not on file  Occupational History   Not on file  Tobacco Use   Smoking status: Current Every Day Smoker    Packs/day: 2.00    Years: 15.00    Pack years: 30.00    Types: Cigarettes   Smokeless tobacco: Former Engineer, waterUser  Substance and Sexual Activity   Alcohol use: Not Currently    Alcohol/week: 0.0  standard drinks   Drug use: Not Currently   Sexual activity: Yes  Other Topics Concern   Not on file  Social History Narrative   Not on file   Social Determinants of Health   Financial Resource Strain:    Difficulty of Paying Living Expenses:   Food Insecurity:    Worried About Programme researcher, broadcasting/film/videounning Out of Food in the Last Year:    Baristaan Out of Food in the Last Year:   Transportation Needs:    Freight forwarderLack of Transportation (Medical):    Lack of Transportation (Non-Medical):   Physical Activity:    Days of Exercise per Week:    Minutes of Exercise per Session:   Stress:    Feeling of Stress :   Social Connections:    Frequency of Communication with Friends and Family:    Frequency of Social Gatherings with Friends and Family:    Attends Religious Services:    Active Member of Clubs or Organizations:    Attends BankerClub or Organization Meetings:    Marital Status:  Intimate Partner Violence:    Fear of Current or Ex-Partner:    Emotionally Abused:    Physically Abused:    Sexually Abused:     Past Medical History, Surgical history, Social history, and Family history were reviewed and updated as appropriate.   Please see review of systems for further details on the patient's review from today.   Objective:   Physical Exam:  BP 114/66    Pulse 65    Wt 171 lb (77.6 kg)    BMI 23.19 kg/m   Physical Exam Constitutional:      General: He is not in acute distress. Musculoskeletal:        General: No deformity.  Neurological:     Mental Status: He is alert and oriented to person, place, and time.     Coordination: Coordination normal.  Psychiatric:        Attention and Perception: Attention and perception normal. He does not perceive auditory or visual hallucinations.        Mood and Affect: Mood is anxious and depressed. Affect is blunt. Affect is not labile, angry or inappropriate.        Speech: Speech normal.        Behavior: Behavior normal.        Thought Content:  Thought content normal. Thought content is not paranoid or delusional. Thought content does not include homicidal or suicidal ideation. Thought content does not include homicidal or suicidal plan.        Cognition and Memory: Cognition and memory normal.        Judgment: Judgment normal.     Comments: Insight intact     Lab Review:     Component Value Date/Time   NA 136 11/10/2017 2052   K 3.7 11/10/2017 2052   CL 100 11/10/2017 2052   CO2 23 11/10/2017 2052   GLUCOSE 72 11/10/2017 2052   BUN 8 11/10/2017 2052   CREATININE 0.61 11/10/2017 2052   CALCIUM 9.0 11/10/2017 2052   PROT 5.5 (L) 01/23/2016 0242   ALBUMIN 3.7 01/23/2016 0242   AST 25 01/23/2016 0242   ALT 19 01/23/2016 0242   ALKPHOS 42 01/23/2016 0242   BILITOT 1.4 (H) 01/23/2016 0242   GFRNONAA >60 11/10/2017 2052   GFRAA >60 11/10/2017 2052       Component Value Date/Time   WBC 7.1 11/10/2017 2052   RBC 4.58 11/10/2017 2052   HGB 15.6 11/10/2017 2052   HCT 44.5 11/10/2017 2052   PLT 275 11/10/2017 2052   MCV 97.2 11/10/2017 2052   MCH 34.1 (H) 11/10/2017 2052   MCHC 35.1 11/10/2017 2052   RDW 13.8 11/10/2017 2052   LYMPHSABS 3.1 11/10/2017 2052   MONOABS 0.5 11/10/2017 2052   EOSABS 0.2 11/10/2017 2052   BASOSABS 0.0 11/10/2017 2052    No results found for: POCLITH, LITHIUM   No results found for: PHENYTOIN, PHENOBARB, VALPROATE, CBMZ   .res Assessment: Plan:   Discussed potential benefits, risks, and side effects of Carbamazepine and discussed that this may be helpful for his mood s/s. Discussed that Carbamazepine typically will stabilize moods and may help decrease anxiety and irritability. Discussed that he should stop Carbamazepine and contact the office if he develops a rash. Will provide pt with samples of Equetro to initiate tx. Discussed starting Equetro 100 mg po QHS for 3 days, then increase to 2 capsules po QHS for 3 days, then may increase up to 3 capsules po QHS for mood stabilization.  Discussed  that Equetro could be changed to generic Carbamazepine. Discussed considering pt assistance for Equetro if GI side effects occur with generic carbamazepine and Equetro is well tolerated.  Continue Abilify 20 mg po qd for mood s/s. Discussed considering future increase in Abilify if Carbamazepine/Equetro is ineffective.  Continue Wellbutrin XL 300 mg po qd for depression. Continue Propranolol 10 mg po BID prn anxiety.  Continue Gabapentin 400 mg po TID for anxiety. Discussed that he may wish to contact Family Services to determine if he may be eligible for therapy services on a sliding scale. Also discussed determining if he is eligible for medical services on a sliding scale to rule out any possible medical causes that could be contributing to fatigue such as anemia or vitamin deficiencies.  Pt to f/u in 4 weeks or sooner if clinically indicated.  Patient advised to contact office with any questions, adverse effects, or acute worsening in signs and symptoms.  Nicholas Melton was seen today for anxiety and depression.  Diagnoses and all orders for this visit:  Bipolar 2 disorder (HCC) -     Carbamazepine (EQUETRO) 100 MG CP12 12 hr capsule; Take 1 capsule at bedtime for 3 days, then increase to 2 capsules at bedtime for 3 days, then may increase to 3 capsules at bedtime as tolerated. -     ARIPiprazole (ABILIFY) 20 MG tablet; Take 1 tablet (20 mg total) by mouth daily. -     buPROPion (WELLBUTRIN XL) 300 MG 24 hr tablet; Take 1 tablet (300 mg total) by mouth daily.  Anxiety disorder, unspecified type -     Carbamazepine (EQUETRO) 100 MG CP12 12 hr capsule; Take 1 capsule at bedtime for 3 days, then increase to 2 capsules at bedtime for 3 days, then may increase to 3 capsules at bedtime as tolerated. -     gabapentin (NEURONTIN) 400 MG capsule; Take 1 capsule (400 mg total) by mouth 3 (three) times daily. -     propranolol (INDERAL) 10 MG tablet; Take 1 tablet (10 mg total) by mouth 2 (two) times  daily as needed.  Alcohol dependence, in remission (HCC)  Other orders -     sildenafil (VIAGRA) 50 MG tablet; Take 1 tablet (50 mg total) by mouth daily as needed for erectile dysfunction.     Please see After Visit Summary for patient specific instructions.  Future Appointments  Date Time Provider Department Center  11/01/2019  2:00 PM [name], PMHNP [facility] None    No orders of the defined types were placed in this encounter.   -------------------------------

## 2019-10-02 NOTE — Progress Notes (Signed)
   10/02/19 1338  Facial and Oral Movements  Muscles of Facial Expression 0  Lips and Perioral Area 0  Jaw 0  Tongue 0  Extremity Movements  Upper (arms, wrists, hands, fingers) 0  Lower (legs, knees, ankles, toes) 0  Trunk Movements  Neck, shoulders, hips 0  Overall Severity  Severity of abnormal movements (highest score from questions above) 0  Incapacitation due to abnormal movements 0  Patient's awareness of abnormal movements (rate only patient's report) 0  AIMS Total Score  AIMS Total Score 0

## 2019-11-01 ENCOUNTER — Ambulatory Visit: Payer: Self-pay | Admitting: Psychiatry

## 2020-02-04 ENCOUNTER — Ambulatory Visit (INDEPENDENT_AMBULATORY_CARE_PROVIDER_SITE_OTHER): Payer: Self-pay | Admitting: Psychiatry

## 2020-02-04 ENCOUNTER — Encounter: Payer: Self-pay | Admitting: Psychiatry

## 2020-02-04 ENCOUNTER — Other Ambulatory Visit: Payer: Self-pay

## 2020-02-04 DIAGNOSIS — F419 Anxiety disorder, unspecified: Secondary | ICD-10-CM

## 2020-02-04 DIAGNOSIS — F3181 Bipolar II disorder: Secondary | ICD-10-CM

## 2020-02-04 MED ORDER — BUPROPION HCL ER (XL) 300 MG PO TB24: 300.0000 mg | ORAL_TABLET | Freq: Every day | ORAL | 1 refills | Status: DC

## 2020-02-04 MED ORDER — VIIBRYD STARTER PACK 10 & 20 MG PO KIT: PACK | ORAL | 0 refills | Status: DC

## 2020-02-04 MED ORDER — GABAPENTIN 400 MG PO CAPS: 400.0000 mg | ORAL_CAPSULE | Freq: Three times a day (TID) | ORAL | 1 refills | Status: DC

## 2020-02-04 MED ORDER — ARIPIPRAZOLE 20 MG PO TABS: 20.0000 mg | ORAL_TABLET | Freq: Every day | ORAL | 1 refills | Status: DC

## 2020-02-04 MED ORDER — SILDENAFIL CITRATE 50 MG PO TABS: 50.0000 mg | ORAL_TABLET | Freq: Every day | ORAL | 2 refills | Status: DC | PRN

## 2020-02-04 MED ORDER — PROPRANOLOL HCL 10 MG PO TABS: 10.0000 mg | ORAL_TABLET | Freq: Two times a day (BID) | ORAL | 1 refills | Status: DC | PRN

## 2020-02-04 NOTE — Progress Notes (Signed)
Nicholas Melton [id number] Dec 18, 1970 49 y.o.  Subjective:   Patient ID:  Nicholas Melton is a 49 y.o. (DOB [birth year]) male.  Chief Complaint:  Chief Complaint  Patient presents with  . Depression  . Anxiety    HPI Nicholas Melton presents to the office today for follow-up of mood disturbance, anxiety, and insomnia.  He reports that he has had recent depression and anxiety. "It's been really hard for me to keep a job." Reports that he ends up quitting jobs due to difficulty focusing at work and retaining instructions. Persistent sadness. He reports irritable mood. Denies any manic s/s. He reports frequent worry and catastrophic thinking. Continues to have social anxiety and reports that this also interferes with being able to work. Sleep has been "ok." Sleeping about 6 hours when he works and then 9-10 hours on days off. Appetite has been ok. Energy and motivation have been very low. Has been playing video games and watching movies and otherwise has limited interests. He reports occ fleeting passive death wishes. Denies SI.  Denies paranoia. Denies AH or VH.  Has had thoughts of using ETOH. Denies ETOH use.   Typically taking Propranolol BID.   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 Reports feeling overwhelmed with work. He reports that he has a job and called out on Friday and is unsure what the consequences may be. Reports that he and his wife had conflict last night when he wanted to see one of his friends that was in town that she does not like. He reports that they have had frequent conflict. Reports that his mother has verbalized frustration that he has had difficulty keeping jobs. Has not talked to father recently. Denies any supportive friends.   Past medication trials: Seroquel-allergic reaction Zyprexa-multiple side effects Risperdal-dissociation Rexulti-felt disoriented Research officer, political party Abilify-helpful for depression Depakote Lamictal-adverse reaction Carbamazepine- dizziness,  lethargic Lithium-lethargic, irritability Gabapentin-helpful for anxiety Trileptal-lethargic, joint pain, back pain BuSpar-adverse reaction Vistaril Anafranil Mirtazapine-helpful for sleep and night terrors Effexor Paxil Prozac- sexual side effects Wellbutrin-helpful for depression Naltrexone-nausea Modafanil- jittery. Intolerable side effects. Propranolol- some benefit with anxiety.   AIMS     Office Visit from 02/04/2020 in [place] Office Visit from 10/02/2019 in [facility]  AIMS Total Score 0 0       Review of Systems:  Review of Systems  Gastrointestinal:       Occ loose stool  Musculoskeletal: Negative for gait problem.  Neurological: Negative for tremors.  Psychiatric/Behavioral:       Please refer to HPI    Medications: I have reviewed the patient's current medications.  Current Outpatient Medications  Medication Sig Dispense Refill  . sildenafil (VIAGRA) 50 MG tablet Take 1 tablet (50 mg total) by mouth daily as needed for erectile dysfunction. 30 tablet 2  . ARIPiprazole (ABILIFY) 20 MG tablet Take 1 tablet (20 mg total) by mouth daily. 90 tablet 1  . buPROPion (WELLBUTRIN XL) 300 MG 24 hr tablet Take 1 tablet (300 mg total) by mouth daily. 90 tablet 1  . gabapentin (NEURONTIN) 400 MG capsule Take 1 capsule (400 mg total) by mouth 3 (three) times daily. 270 capsule 1  . propranolol (INDERAL) 10 MG tablet Take 1 tablet (10 mg total) by mouth 2 (two) times daily as needed. 90 tablet 1  . Vilazodone HCl (VIIBRYD STARTER PACK) 10 & 20 MG KIT Take 1/2 of a 10 mg tablet with breakfast for 5-10 days, then increase to one 10 mg tablet with breakfast as tolerated  2 kit 0   No current facility-administered medications for this visit.    Medication Side Effects: Other: Possible loose stool  Allergies:  Allergies  Allergen Reactions  . Risperidone And Related Other (See Comments)    Faint, dizzy, lethargic   . Seroquel  [Quetiapine] Other (See Comments)    Faint, dizzy, lethargic   . Depakote Er [Divalproex Sodium Er] Diarrhea  . Ambien [Zolpidem Tartrate] Other (See Comments)    Hallucinations   . Lamictal [Lamotrigine] Nausea And Vomiting    Past Medical History:  Diagnosis Date  . Anxiety   . Bipolar 2 disorder ([place])   . Depression   . Hypertension   . IBS (irritable bowel syndrome)   . Night terrors, adult   . OCD (obsessive compulsive disorder)   . Panic attacks   . Posttraumatic stress disorder   . Social phobia     Family History  Problem Relation Age of Onset  . Cancer Brother   . Drug abuse Paternal Uncle   . Alcohol abuse Paternal Grandfather     Social History   Socioeconomic History  . Marital status: Married    Spouse name: Not on file  . Number of children: Not on file  . Years of education: Not on file  . Highest education level: Not on file  Occupational History  . Not on file  Tobacco Use  . Smoking status: Current Every Day Smoker    Packs/day: 2.00    Years: 15.00    Pack years: 30.00    Types: Cigarettes  . Smokeless tobacco: Former Network engineer and Sexual Activity  . Alcohol use: Not Currently    Alcohol/week: 0.0 standard drinks  . Drug use: Not Currently  . Sexual activity: Yes  Other Topics Concern  . Not on file  Social History Narrative  . Not on file   Social Determinants of Health   Financial Resource Strain:   . Difficulty of Paying Living Expenses: Not on file  Food Insecurity:   . Worried About Charity fundraiser in the Last Year: Not on file  . Ran Out of Food in the Last Year: Not on file  Transportation Needs:   . Lack of Transportation (Medical): Not on file  . Lack of Transportation (Non-Medical): Not on file  Physical Activity:   . Days of Exercise per Week: Not on file  . Minutes of Exercise per Session: Not on file  Stress:   . Feeling of Stress : Not on file  Social Connections:   . Frequency of Communication with  Friends and Family: Not on file  . Frequency of Social Gatherings with Friends and Family: Not on file  . Attends Religious Services: Not on file  . Active Member of Clubs or Organizations: Not on file  . Attends Archivist Meetings: Not on file  . Marital Status: Not on file  Intimate Partner Violence:   . Fear of Current or Ex-Partner: Not on file  . Emotionally Abused: Not on file  . Physically Abused: Not on file  . Sexually Abused: Not on file    Past Medical History, Surgical history, Social history, and Family history were reviewed and updated as appropriate.   Please see review of systems for further details on the patient's review from today.   Objective:   Physical Exam:  There were no vitals taken for this visit.  Physical Exam Constitutional:      General: He is not in acute  distress. Musculoskeletal:        General: No deformity.  Neurological:     Mental Status: He is alert and oriented to person, place, and time.     Coordination: Coordination normal.  Psychiatric:        Attention and Perception: Attention and perception normal. He does not perceive auditory or visual hallucinations.        Mood and Affect: Mood is anxious and depressed. Affect is not labile, blunt, angry or inappropriate.        Speech: Speech normal.        Behavior: Behavior normal.        Thought Content: Thought content normal. Thought content is not paranoid or delusional. Thought content does not include homicidal or suicidal ideation. Thought content does not include homicidal or suicidal plan.        Cognition and Memory: Cognition and memory normal.        Judgment: Judgment normal.     Comments: Insight intact     Lab Review:     Component Value Date/Time   NA 136 11/10/2017 2052   K 3.7 11/10/2017 2052   CL 100 11/10/2017 2052   CO2 23 11/10/2017 2052   GLUCOSE 72 11/10/2017 2052   BUN 8 11/10/2017 2052   CREATININE 0.61 11/10/2017 2052   CALCIUM 9.0 11/10/2017  2052   PROT 5.5 (L) 01/23/2016 0242   ALBUMIN 3.7 01/23/2016 0242   AST 25 01/23/2016 0242   ALT 19 01/23/2016 0242   ALKPHOS 42 01/23/2016 0242   BILITOT 1.4 (H) 01/23/2016 0242   GFRNONAA >60 11/10/2017 2052   GFRAA >60 11/10/2017 2052       Component Value Date/Time   WBC 7.1 11/10/2017 2052   RBC 4.58 11/10/2017 2052   HGB 15.6 11/10/2017 2052   HCT 44.5 11/10/2017 2052   PLT 275 11/10/2017 2052   MCV 97.2 11/10/2017 2052   MCH 34.1 (H) 11/10/2017 2052   MCHC 35.1 11/10/2017 2052   RDW 13.8 11/10/2017 2052   LYMPHSABS 3.1 11/10/2017 2052   MONOABS 0.5 11/10/2017 2052   EOSABS 0.2 11/10/2017 2052   BASOSABS 0.0 11/10/2017 2052    No results found for: POCLITH, LITHIUM   No results found for: PHENYTOIN, PHENOBARB, VALPROATE, CBMZ   .res Assessment: Plan:   Discussed potential benefits, risks, and side effects of Viibryd. Discussed that Viibryd may be helpful for both anxiety and depressive s/s and he should be eligible for Pt Assistance. Consider Trintellix if unable to tolerate Viibryd. Will continue all other medications as prescribed.  Pt ot follow-up in 6 months or sooner if clinically indicated.  Patient advised to contact office with any questions, adverse effects, or acute worsening in signs and symptoms.   Chance was seen today for depression and anxiety.  Diagnoses and all orders for this visit:  Anxiety disorder, unspecified type -     Vilazodone HCl (VIIBRYD STARTER PACK) 10 & 20 MG KIT; Take 1/2 of a 10 mg tablet with breakfast for 5-10 days, then increase to one 10 mg tablet with breakfast as tolerated -     gabapentin (NEURONTIN) 400 MG capsule; Take 1 capsule (400 mg total) by mouth 3 (three) times daily. -     propranolol (INDERAL) 10 MG tablet; Take 1 tablet (10 mg total) by mouth 2 (two) times daily as needed.  Bipolar 2 disorder, major depressive episode (HCC) -     Vilazodone HCl (VIIBRYD STARTER PACK) 10 & 20 MG  KIT; Take 1/2 of a 10 mg tablet  with breakfast for 5-10 days, then increase to one 10 mg tablet with breakfast as tolerated  Bipolar 2 disorder (HCC) -     ARIPiprazole (ABILIFY) 20 MG tablet; Take 1 tablet (20 mg total) by mouth daily. -     buPROPion (WELLBUTRIN XL) 300 MG 24 hr tablet; Take 1 tablet (300 mg total) by mouth daily.  Other orders -     sildenafil (VIAGRA) 50 MG tablet; Take 1 tablet (50 mg total) by mouth daily as needed for erectile dysfunction.     Please see After Visit Summary for patient specific instructions.  Future Appointments  Date Time Provider [place]  08/04/2020  1:30 PM Thayer Headings, PMHNP CP-CP None    No orders of the defined types were placed in this encounter.   -------------------------------

## 2020-02-04 NOTE — Progress Notes (Signed)
°   02/04/20 0914  Facial and Oral Movements  Muscles of Facial Expression 0  Lips and Perioral Area 0  Jaw 0  Tongue 0  Extremity Movements  Upper (arms, wrists, hands, fingers) 0  Lower (legs, knees, ankles, toes) 0  Trunk Movements  Neck, shoulders, hips 0  Overall Severity  Severity of abnormal movements (highest score from questions above) 0  Incapacitation due to abnormal movements 0  Patient's awareness of abnormal movements (rate only patient's report) 0  AIMS Total Score  AIMS Total Score 0

## 2020-05-23 ENCOUNTER — Telehealth: Payer: Self-pay | Admitting: Psychiatry

## 2020-05-23 DIAGNOSIS — F419 Anxiety disorder, unspecified: Secondary | ICD-10-CM

## 2020-05-23 DIAGNOSIS — F3181 Bipolar II disorder: Secondary | ICD-10-CM

## 2020-05-23 MED ORDER — BUPROPION HCL ER (XL) 300 MG PO TB24: 300.0000 mg | ORAL_TABLET | Freq: Every day | ORAL | 0 refills | Status: DC

## 2020-05-23 MED ORDER — GABAPENTIN 400 MG PO CAPS: 400.0000 mg | ORAL_CAPSULE | Freq: Three times a day (TID) | ORAL | 0 refills | Status: DC

## 2020-05-23 MED ORDER — PROPRANOLOL HCL 10 MG PO TABS: 10.0000 mg | ORAL_TABLET | Freq: Two times a day (BID) | ORAL | 1 refills | Status: DC | PRN

## 2020-05-23 MED ORDER — ARIPIPRAZOLE 20 MG PO TABS: 20.0000 mg | ORAL_TABLET | Freq: Every day | ORAL | 0 refills | Status: DC

## 2020-05-23 NOTE — Telephone Encounter (Signed)
Scripts sent

## 2020-05-23 NOTE — Telephone Encounter (Signed)
Pt called and left a message stating that he needs refills on all of his medications. He needs them to be sent to [facility] pharmacy. His next appt is in may

## 2020-08-04 ENCOUNTER — Encounter: Payer: Self-pay | Admitting: Psychiatry

## 2020-08-04 ENCOUNTER — Other Ambulatory Visit: Payer: Self-pay

## 2020-08-04 ENCOUNTER — Ambulatory Visit (INDEPENDENT_AMBULATORY_CARE_PROVIDER_SITE_OTHER): Payer: Self-pay | Admitting: Psychiatry

## 2020-08-04 VITALS — BP 118/76 | HR 55 | Wt 178.0 lb

## 2020-08-04 DIAGNOSIS — G4726 Circadian rhythm sleep disorder, shift work type: Secondary | ICD-10-CM

## 2020-08-04 DIAGNOSIS — F419 Anxiety disorder, unspecified: Secondary | ICD-10-CM

## 2020-08-04 DIAGNOSIS — F3181 Bipolar II disorder: Secondary | ICD-10-CM

## 2020-08-04 MED ORDER — ARMODAFINIL 150 MG PO TABS: ORAL_TABLET | ORAL | 0 refills | Status: DC

## 2020-08-04 MED ORDER — PROPRANOLOL HCL 10 MG PO TABS
10.0000 mg | ORAL_TABLET | Freq: Two times a day (BID) | ORAL | 1 refills | Status: DC | PRN
Start: 2020-08-04 — End: 2021-01-26

## 2020-08-04 MED ORDER — ARIPIPRAZOLE 20 MG PO TABS: 20.0000 mg | ORAL_TABLET | Freq: Every day | ORAL | 1 refills | Status: DC

## 2020-08-04 MED ORDER — GABAPENTIN 400 MG PO CAPS: 400.0000 mg | ORAL_CAPSULE | Freq: Three times a day (TID) | ORAL | 1 refills | Status: DC

## 2020-08-04 MED ORDER — BUPROPION HCL ER (XL) 300 MG PO TB24: 300.0000 mg | ORAL_TABLET | Freq: Every day | ORAL | 1 refills | Status: DC

## 2020-08-04 NOTE — Progress Notes (Signed)
   08/04/20 1414  Facial and Oral Movements  Muscles of Facial Expression 0  Lips and Perioral Area 0  Jaw 0  Tongue 0  Extremity Movements  Upper (arms, wrists, hands, fingers) 0  Lower (legs, knees, ankles, toes) 0  Trunk Movements  Neck, shoulders, hips 0  Overall Severity  Severity of abnormal movements (highest score from questions above) 0  Incapacitation due to abnormal movements 0  Patient's awareness of abnormal movements (rate only patient's report) 0  AIMS Total Score  AIMS Total Score 0

## 2020-08-04 NOTE — Progress Notes (Signed)
Nicholas Melton [id number] 1970-07-14 50 y.o.  Subjective:   Patient ID:  Nicholas Melton is a 50 y.o. (DOB 1970/10/08) male.  Chief Complaint:  Chief Complaint  Patient presents with  . Other    Impaired concentration  . Follow-up    H/o mood disturbance, anxiety    HPI Nicholas Melton presents to the office today for follow-up of mood disturbance, anxiety, and poor concentration. He has had the same job for 5.5 months at [employer]. He matches replacement parts with broken units.   Unable to tolerate Viibryd due to loose stools.   He reports difficulty with concentration at work and has not been making quotas. He reports that he will "zone out" and is easily distracted. He reports difficulty with focus at work. He reports "home life is still the same" and marital relationship is ok.   He describes mood as "ok." Denies any severe depression recently. He reports that his wife has noticed he has some manic s/s at times, such as staying up later. Denies excessive spending or risky behavior. He reports occasional irritability. Anxiety has been ok. He reports occasional anxiety at work, typically with interaction with supervisors. Denies panic s/s. Sleep has been ok. He reports occ middle of the night awakenings. Appetite has been normal. He reports some weight gain. Energy and motivation have been a little low. He reports that he has called out of work on a few occasions due to feeling as if he would not be able to function. Denies SI.   He reports some mild ETOH cravings. He reports that he cannot recall the last time he used ETOH.   Working 3:30-midnight, Monday-Thursday. Reports limited interest and activities outside of work. Places video games some. Financial constraints. Landlord raised rent.   Takes Propranolol prn BID.  Past medication trials: Seroquel-allergic reaction Zyprexa-multiple side effects Risperdal-dissociation Rexulti-felt disoriented Advertising copywriter Abilify-helpful for  depression Depakote Lamictal-adverse reaction Carbamazepine- dizziness, lethargic Lithium-lethargic, irritability Gabapentin-helpful for anxiety Trileptal-lethargic, joint pain, back pain BuSpar-adverse reaction Vistaril Anafranil Mirtazapine-helpful for sleep and night terrors Effexor Paxil Prozac- sexual side effects Viibryd- loose stools Wellbutrin-helpful for depression Naltrexone-nausea Modafanil- jittery. Intolerable side effects. Propranolol- some benefit with anxiety.  AIMS   Flowsheet Row Office Visit from 08/04/2020 in [facility] Office Visit from 02/04/2020 in [facility] Office Visit from 10/02/2019 in [facility]  AIMS Total Score 0 0 0    Flowsheet Row ED from 11/10/2017 in [facility]  C-SSRS RISK CATEGORY High Risk       Review of Systems:  Review of Systems  Gastrointestinal: Negative for abdominal pain.       Loose stools  Musculoskeletal: Negative for gait problem.  Neurological: Negative for dizziness, tremors and light-headedness.  Psychiatric/Behavioral:       Please refer to HPI    Medications: I have reviewed the patient's current medications.  Current Outpatient Medications  Medication Sig Dispense Refill  . Armodafinil 150 MG tablet Take 1/2-1 tablet by mouth at start of work day. 5 tablet 0  . loperamide (IMODIUM) 2 MG capsule Take by mouth as needed for diarrhea or loose stools.    . sildenafil (VIAGRA) 50 MG tablet Take 1 tablet (50 mg total) by mouth daily as needed for erectile dysfunction. 30 tablet 2  . ARIPiprazole (ABILIFY) 20 MG tablet Take 1 tablet (20 mg total) by mouth daily. 90 tablet 1  . buPROPion (WELLBUTRIN XL) 300 MG 24 hr tablet Take 1 tablet (300 mg  total) by mouth daily. 90 tablet 1  . gabapentin (NEURONTIN) 400 MG capsule Take 1 capsule (400 mg total) by mouth 3 (three) times daily. 270 capsule 1  . propranolol (INDERAL) 10 MG tablet  Take 1 tablet (10 mg total) by mouth 2 (two) times daily as needed. 180 tablet 1   No current facility-administered medications for this visit.    Medication Side Effects: None  Allergies:  Allergies  Allergen Reactions  . Risperidone And Related Other (See Comments)    Faint, dizzy, lethargic   . Seroquel [Quetiapine] Other (See Comments)    Faint, dizzy, lethargic   . Depakote Er [Divalproex Sodium Er] Diarrhea  . Ambien [Zolpidem Tartrate] Other (See Comments)    Hallucinations   . Lamictal [Lamotrigine] Nausea And Vomiting    Past Medical History:  Diagnosis Date  . Anxiety   . Bipolar 2 disorder (HCC)   . Depression   . Hypertension   . IBS (irritable bowel syndrome)   . Night terrors, adult   . OCD (obsessive compulsive disorder)   . Panic attacks   . Posttraumatic stress disorder   . Social phobia     Past Medical History, Surgical history, Social history, and Family history were reviewed and updated as appropriate.   Please see review of systems for further details on the patient's review from today.   Objective:   Physical Exam:  BP 118/76   Pulse (!) 55   Wt 178 lb (80.7 kg)   BMI 24.14 kg/m   Physical Exam Constitutional:      General: He is not in acute distress. Musculoskeletal:        General: No deformity.  Neurological:     Mental Status: He is alert and oriented to person, place, and time.     Coordination: Coordination normal.  Psychiatric:        Attention and Perception: Attention and perception normal. He does not perceive auditory or visual hallucinations.        Mood and Affect: Mood normal. Mood is not anxious or depressed. Affect is not labile, blunt, angry or inappropriate.        Speech: Speech normal.        Behavior: Behavior normal.        Thought Content: Thought content normal. Thought content is not paranoid or delusional. Thought content does not include homicidal or suicidal ideation. Thought content does not include  homicidal or suicidal plan.        Cognition and Memory: Cognition and memory normal.        Judgment: Judgment normal.     Comments: Insight intact     Lab Review:     Component Value Date/Time   NA 136 11/10/2017 2052   K 3.7 11/10/2017 2052   CL 100 11/10/2017 2052   CO2 23 11/10/2017 2052   GLUCOSE 72 11/10/2017 2052   BUN 8 11/10/2017 2052   CREATININE 0.61 11/10/2017 2052   CALCIUM 9.0 11/10/2017 2052   PROT 5.5 (L) 01/23/2016 0242   ALBUMIN 3.7 01/23/2016 0242   AST 25 01/23/2016 0242   ALT 19 01/23/2016 0242   ALKPHOS 42 01/23/2016 0242   BILITOT 1.4 (H) 01/23/2016 0242   GFRNONAA >60 11/10/2017 2052   GFRAA >60 11/10/2017 2052       Component Value Date/Time   WBC 7.1 11/10/2017 2052   RBC 4.58 11/10/2017 2052   HGB 15.6 11/10/2017 2052   HCT 44.5 11/10/2017 2052   PLT 275  11/10/2017 2052   MCV 97.2 11/10/2017 2052   MCH 34.1 (H) 11/10/2017 2052   MCHC 35.1 11/10/2017 2052   RDW 13.8 11/10/2017 2052   LYMPHSABS 3.1 11/10/2017 2052   MONOABS 0.5 11/10/2017 2052   EOSABS 0.2 11/10/2017 2052   BASOSABS 0.0 11/10/2017 2052    No results found for: POCLITH, LITHIUM   No results found for: PHENYTOIN, PHENOBARB, VALPROATE, CBMZ   .res Assessment: Plan:    Discussed trial of armodafinil to improve energy, motivation, and concentration associated with shift work sleep disorder.  Advised patient to start with taking half of a 150 mg tablet since he experienced jitteriness with modafinil in the past.  Will send limited quantity of tablets since patient  to determine response does not have prescription coverage and can send additional 30-day supply if medication is effective and well tolerated. Continue Abilify 20 mg daily for mood signs and symptoms. Continue Wellbutrin XL 300 mg daily for mood signs and symptoms. Continue gabapentin 400 mg 3 times daily for anxiety. Continue propanolol 10 mg twice daily as needed for anxiety. Patient to follow-up in 6 months or  sooner if clinically indicated. Patient advised to contact office with any questions, adverse effects, or acute worsening in signs and symptoms.    Daruis was seen today for other and follow-up.  Diagnoses and all orders for this visit:  Shift work sleep disorder -     Armodafinil 150 MG tablet; Take 1/2-1 tablet by mouth at start of work day.  Bipolar 2 disorder (HCC) -     ARIPiprazole (ABILIFY) 20 MG tablet; Take 1 tablet (20 mg total) by mouth daily. -     buPROPion (WELLBUTRIN XL) 300 MG 24 hr tablet; Take 1 tablet (300 mg total) by mouth daily.  Anxiety disorder, unspecified type -     gabapentin (NEURONTIN) 400 MG capsule; Take 1 capsule (400 mg total) by mouth 3 (three) times daily. -     propranolol (INDERAL) 10 MG tablet; Take 1 tablet (10 mg total) by mouth 2 (two) times daily as needed.     Please see After Visit Summary for patient specific instructions.  Future Appointments  Date Time Provider Department Center  01/26/2021  1:15 PM [name], PMHNP CP-[facility] None    No orders of the defined types were placed in this encounter.   -------------------------------

## 2020-08-28 ENCOUNTER — Telehealth: Payer: Self-pay | Admitting: Psychiatry

## 2020-08-28 ENCOUNTER — Other Ambulatory Visit: Payer: Self-pay

## 2020-08-28 DIAGNOSIS — G4726 Circadian rhythm sleep disorder, shift work type: Secondary | ICD-10-CM

## 2020-08-28 MED ORDER — ARMODAFINIL 150 MG PO TABS: ORAL_TABLET | ORAL | 2 refills | Status: DC

## 2020-08-28 NOTE — Telephone Encounter (Signed)
Pended.

## 2020-08-28 NOTE — Telephone Encounter (Signed)
Pt would like a refill on Armodanfinil 150mg . Please send to [facility].

## 2021-01-26 ENCOUNTER — Other Ambulatory Visit: Payer: Self-pay

## 2021-01-26 ENCOUNTER — Encounter: Payer: Self-pay | Admitting: Psychiatry

## 2021-01-26 ENCOUNTER — Ambulatory Visit (INDEPENDENT_AMBULATORY_CARE_PROVIDER_SITE_OTHER): Payer: Self-pay | Admitting: Psychiatry

## 2021-01-26 DIAGNOSIS — F419 Anxiety disorder, unspecified: Secondary | ICD-10-CM

## 2021-01-26 DIAGNOSIS — F3181 Bipolar II disorder: Secondary | ICD-10-CM

## 2021-01-26 MED ORDER — BUPROPION HCL ER (XL) 300 MG PO TB24: 300.0000 mg | ORAL_TABLET | Freq: Every day | ORAL | 1 refills | Status: DC

## 2021-01-26 MED ORDER — ARIPIPRAZOLE 20 MG PO TABS: 20.0000 mg | ORAL_TABLET | Freq: Every day | ORAL | 1 refills | Status: DC

## 2021-01-26 MED ORDER — SILDENAFIL CITRATE 50 MG PO TABS: 50.0000 mg | ORAL_TABLET | Freq: Every day | ORAL | 2 refills | Status: DC | PRN

## 2021-01-26 MED ORDER — GABAPENTIN 400 MG PO CAPS: 400.0000 mg | ORAL_CAPSULE | Freq: Three times a day (TID) | ORAL | 1 refills | Status: DC

## 2021-01-26 MED ORDER — PROPRANOLOL HCL 10 MG PO TABS: 10.0000 mg | ORAL_TABLET | Freq: Two times a day (BID) | ORAL | 1 refills | Status: DC | PRN

## 2021-01-26 NOTE — Progress Notes (Signed)
Nicholas Melton [id number] 06-09-1970 50 y.o.  Subjective:   Patient ID:  Nicholas Melton is a 50 y.o. (DOB [birth year]) male.  Chief Complaint:  Chief Complaint  Patient presents with   Follow-up    H/o anxiety, mood disturbance    HPI Nicholas Melton presents to the office today for follow-up of anxiety and mood disturbance. He reports that he became "edgy" with Armodafinil- and stopped it.  He reports that he continues to have some low energy "but not that bad." Mood has been stable overall. He reports that depression is infrequent and mild. He reports that his wife has noticed a couple of times that he may have had brief mild mania with not wanting to sleep. He denies any impulsive or risky behavior. Occ mild irritability. Sleeping well overall and averaging about 7-8 hours and more on weekends. Appetite has been ok. Motivation has been "about average." Concentration is fair. 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 Denies SI.   Denies ETOH use. Occ cravings for ETOH use. Denies any substance use.   He has been at his job for a year now and reports that this has been going ok. He works second shift. He reports that he gets rotated to different areas. Financial stressors.   Past medication trials: Seroquel-allergic reaction Zyprexa- multiple side effects Risperdal-dissociation Rexulti-felt disoriented Advertising copywriter Abilify-helpful for depression Depakote Lamictal-adverse reaction Carbamazepine- dizziness, lethargic Lithium-lethargic, irritability Gabapentin-helpful for anxiety Trileptal-lethargic, joint pain, back pain BuSpar-adverse reaction Vistaril Anafranil Mirtazapine-helpful for sleep and night terrors Effexor Paxil Prozac- sexual side effects Viibryd- loose stools Wellbutrin-helpful for depression Naltrexone-nausea Modafanil- jittery. Intolerable side effects.  Armodafinil-increased irritability Propranolol- some benefit with anxiety.    AIMS    Flowsheet Row Office Visit from 08/04/2020 in [facility] Office Visit from 02/04/2020 in [facility] Office Visit from 10/02/2019 in [facility]  AIMS Total Score 0 0 0      Flowsheet Row ED from 11/10/2017 in [facility]EMERGENCY DEPT  C-SSRS RISK CATEGORY High Risk        Review of Systems:  Review of Systems  Gastrointestinal:  Positive for diarrhea.  Musculoskeletal:  Negative for gait problem.  Neurological:  Negative for tremors.  Psychiatric/Behavioral:         Please refer to HPI   Medications: I have reviewed the patient's current medications.  Current Outpatient Medications  Medication Sig Dispense Refill   loperamide (IMODIUM) 2 MG capsule Take by mouth as needed for diarrhea or loose stools.     ARIPiprazole (ABILIFY) 20 MG tablet Take 1 tablet (20 mg total) by mouth daily. 90 tablet 1   buPROPion (WELLBUTRIN XL) 300 MG 24 hr tablet Take 1 tablet (300 mg total) by mouth daily. 90 tablet 1   gabapentin (NEURONTIN) 400 MG capsule Take 1 capsule (400 mg total) by mouth 3 (three) times daily. 270 capsule 1   propranolol (INDERAL) 10 MG tablet Take 1 tablet (10 mg total) by mouth 2 (two) times daily as needed. 180 tablet 1   sildenafil (VIAGRA) 50 MG tablet Take 1 tablet (50 mg total) by mouth daily as needed for erectile dysfunction. 30 tablet 2   No current facility-administered medications for this visit.    Medication Side Effects: None  Allergies:  Allergies  Allergen Reactions   Risperidone And Related Other (See Comments)    Faint, dizzy, lethargic    Seroquel [Quetiapine] Other (See Comments)    Faint, dizzy, lethargic  Depakote Er [Divalproex Sodium Er] Diarrhea   Ambien [Zolpidem Tartrate] Other (See Comments)    Hallucinations    Lamictal [Lamotrigine] Nausea And Vomiting    Past Medical History:  Diagnosis Date   Anxiety    Bipolar 2 disorder (HCC)    Depression     Hypertension    IBS (irritable bowel syndrome)    Night terrors, adult    OCD (obsessive compulsive disorder)    Panic attacks    Posttraumatic stress disorder    Social phobia     Past Medical History, Surgical history, Social history, and Family history were reviewed and updated as appropriate.   Please see review of systems for further details on the patient's review from today.   Objective:   Physical Exam:  BP 134/90   Pulse 62   Physical Exam Constitutional:      General: He is not in acute distress. Musculoskeletal:        General: No deformity.  Neurological:     Mental Status: He is alert and oriented to person, place, and time.     Coordination: Coordination normal.  Psychiatric:        Attention and Perception: Attention and perception normal. He does not perceive auditory or visual hallucinations.        Mood and Affect: Mood normal. Mood is not anxious or depressed. Affect is not labile, blunt, angry or inappropriate.        Speech: Speech normal.        Behavior: Behavior normal.        Thought Content: Thought content normal. Thought content is not paranoid or delusional. Thought content does not include homicidal or suicidal ideation. Thought content does not include homicidal or suicidal plan.        Cognition and Memory: Cognition and memory normal.        Judgment: Judgment normal.     Comments: Insight intact    Lab Review:     Component Value Date/Time   NA 136 11/10/2017 2052   K 3.7 11/10/2017 2052   CL 100 11/10/2017 2052   CO2 23 11/10/2017 2052   GLUCOSE 72 11/10/2017 2052   BUN 8 11/10/2017 2052   CREATININE 0.61 11/10/2017 2052   CALCIUM 9.0 11/10/2017 2052   PROT 5.5 (L) 01/23/2016 0242   ALBUMIN 3.7 01/23/2016 0242   AST 25 01/23/2016 0242   ALT 19 01/23/2016 0242   ALKPHOS 42 01/23/2016 0242   BILITOT 1.4 (H) 01/23/2016 0242   GFRNONAA >60 11/10/2017 2052   GFRAA >60 11/10/2017 2052       Component Value Date/Time   WBC  7.1 11/10/2017 2052   RBC 4.58 11/10/2017 2052   HGB 15.6 11/10/2017 2052   HCT 44.5 11/10/2017 2052   PLT 275 11/10/2017 2052   MCV 97.2 11/10/2017 2052   MCH 34.1 (H) 11/10/2017 2052   MCHC 35.1 11/10/2017 2052   RDW 13.8 11/10/2017 2052   LYMPHSABS 3.1 11/10/2017 2052   MONOABS 0.5 11/10/2017 2052   EOSABS 0.2 11/10/2017 2052   BASOSABS 0.0 11/10/2017 2052    No results found for: POCLITH, LITHIUM   No results found for: PHENYTOIN, PHENOBARB, VALPROATE, CBMZ   .res Assessment: Plan:    Will continue current plan of care since target signs and symptoms are well controlled without any tolerability issues. Continue Abilify 20 mg po qd for depression and anxiety.  Continue Wellbutrin XL 300 mg po qd for depression.  Continue Propranolol 10 mg  po BID prn tremor and anxiety.  Continue Viagra prn due to sexual side effects.  Pt to follow-up in 6 months or sooner if clinically indicated.  Patient advised to contact office with any questions, adverse effects, or acute worsening in signs and symptoms.   Nicholas Melton was seen today for follow-up.  Diagnoses and all orders for this visit:  Bipolar 2 disorder (HCC) -     ARIPiprazole (ABILIFY) 20 MG tablet; Take 1 tablet (20 mg total) by mouth daily. -     buPROPion (WELLBUTRIN XL) 300 MG 24 hr tablet; Take 1 tablet (300 mg total) by mouth daily.  Anxiety disorder, unspecified type -     gabapentin (NEURONTIN) 400 MG capsule; Take 1 capsule (400 mg total) by mouth 3 (three) times daily. -     propranolol (INDERAL) 10 MG tablet; Take 1 tablet (10 mg total) by mouth 2 (two) times daily as needed.  Other orders -     sildenafil (VIAGRA) 50 MG tablet; Take 1 tablet (50 mg total) by mouth daily as needed for erectile dysfunction.    Please see After Visit Summary for patient specific instructions.  Future Appointments  Date Time Provider Department Center  07/27/2021  1:00 PM [name], PMHNP [facility] None    No orders of the  defined types were placed in this encounter.   -------------------------------

## 2021-07-06 ENCOUNTER — Encounter: Payer: Self-pay | Admitting: Psychiatry

## 2021-07-06 ENCOUNTER — Ambulatory Visit (INDEPENDENT_AMBULATORY_CARE_PROVIDER_SITE_OTHER): Payer: Self-pay | Admitting: Psychiatry

## 2021-07-06 DIAGNOSIS — F419 Anxiety disorder, unspecified: Secondary | ICD-10-CM

## 2021-07-06 DIAGNOSIS — G4726 Circadian rhythm sleep disorder, shift work type: Secondary | ICD-10-CM

## 2021-07-06 DIAGNOSIS — F3181 Bipolar II disorder: Secondary | ICD-10-CM

## 2021-07-06 MED ORDER — ARIPIPRAZOLE 20 MG PO TABS: 20.0000 mg | ORAL_TABLET | Freq: Every day | ORAL | 1 refills | Status: DC

## 2021-07-06 MED ORDER — BUPROPION HCL ER (XL) 300 MG PO TB24: 300.0000 mg | ORAL_TABLET | Freq: Every day | ORAL | 1 refills | Status: DC

## 2021-07-06 MED ORDER — PROPRANOLOL HCL 10 MG PO TABS: 10.0000 mg | ORAL_TABLET | Freq: Two times a day (BID) | ORAL | 1 refills | Status: DC | PRN

## 2021-07-06 MED ORDER — GABAPENTIN 400 MG PO CAPS: 400.0000 mg | ORAL_CAPSULE | Freq: Three times a day (TID) | ORAL | 1 refills | Status: DC

## 2021-07-06 MED ORDER — TADALAFIL 10 MG PO TABS: 10.0000 mg | ORAL_TABLET | Freq: Every day | ORAL | 5 refills | Status: DC | PRN

## 2021-07-06 MED ORDER — ARMODAFINIL 150 MG PO TABS: ORAL_TABLET | ORAL | 5 refills | Status: DC

## 2021-07-06 NOTE — Progress Notes (Signed)
Nicholas Melton ?[id number] ?1970-09-08 ?51 y.o. ? ?Subjective:  ? ?Patient ID:  Nicholas Melton is a 51 y.o. (DOB 05-Jul-1970) male. ? ?Chief Complaint:  ?Chief Complaint  ?Patient presents with  ? Other  ?  Impaired concentration  ? Follow-up  ?  Anxiety, h/o mood disturbance  ? ? ?HPI ?Nicholas Melton presents to the office today for follow-up of anxiety, Bipolar disorder and concentration. He noticed some difficulty with concentration and productivity at work and he then re-started Armodafinil and his numbers improved at work and he was able to stay on task. He reports that he and his wife did not notice increased irritability when he took Armodafinil. He reports that Armodafinil also seemed to help with energy and motivation. He reports that he ran out of Armodafinil and would like to get back on it. Denies depressed mood. He reports that there have been a few nights when he did not go to bed until about 5 am. He denies any other manic symptoms, to include impulsive or risky behavior. He reports that he averages 7-8 hours of sleep a night. Appetite has been good. He reports that he has gained weight. Concentration was improved on Armodafinil. Denies SI.  ? ?He is still working at the same job for the past year and 4 months. He works second shift and gets off at midnight. He has weekends off. He reports that he and his wife have been doing ok. Some financial stressors. He reports that he has been getting along well with family. Considering adopting a third cat.  ? ?Denies any recent ETOH use. Occ cravings to drink on weekends when feeling "bored."  ? ? ?Past medication trials: ?Seroquel-allergic reaction ?Zyprexa- multiple side effects ?Risperdal-dissociation ?Rexulti-felt disoriented ?Latuda ?Vraylar ?Abilify-helpful for depression ?Depakote ?Lamictal-adverse reaction ?Carbamazepine- dizziness, lethargic ?Lithium-lethargic, irritability ?Gabapentin-helpful for anxiety ?Trileptal-lethargic, joint pain, back  pain ?BuSpar-adverse reaction ?Vistaril ?Anafranil ?Mirtazapine-helpful for sleep and night terrors ?Effexor ?Paxil ?Prozac- sexual side effects ?Viibryd- loose stools ?Wellbutrin-helpful for depression ?Naltrexone-nausea ?Modafanil- jittery. Intolerable side effects.  ?Armodafinil-increased irritability ?Propranolol- some benefit with anxiety.  ? ?AIMS   ? ?Flowsheet Row Office Visit from 07/06/2021 in [facility] Office Visit from 08/04/2020 in [facility] Office Visit from 02/04/2020 in [facility] Office Visit from 10/02/2019 in [facility]  ?AIMS Total Score 0 0 0 0  ? ?  ? ?Flowsheet Row ED from 11/10/2017 in [facility]  ?C-SSRS RISK CATEGORY High Risk  ? ?  ?  ? ?Review of Systems:  ?Review of Systems  ?Gastrointestinal:  Positive for diarrhea.  ?Musculoskeletal:  Negative for gait problem.  ?Neurological:  Negative for tremors.  ?Psychiatric/Behavioral:    ?     Please refer to HPI  ? ?Medications: I have reviewed the patient's current medications. ? ?Current Outpatient Medications  ?Medication Sig Dispense Refill  ? loperamide (IMODIUM) 2 MG capsule Take by mouth as needed for diarrhea or loose stools.    ? tadalafil (CIALIS) 10 MG tablet Take 1 tablet (10 mg total) by mouth daily as needed for erectile dysfunction. 30 tablet 5  ? ARIPiprazole (ABILIFY) 20 MG tablet Take 1 tablet (20 mg total) by mouth daily. 90 tablet 1  ? Armodafinil 150 MG tablet Take 1/2-1 tablet by mouth at start of work day. 30 tablet 5  ? buPROPion (WELLBUTRIN XL) 300 MG 24 hr tablet Take 1 tablet (300 mg total) by mouth daily. 90 tablet 1  ? gabapentin (NEURONTIN) 400 MG  capsule Take 1 capsule (400 mg total) by mouth 3 (three) times daily. 270 capsule 1  ? propranolol (INDERAL) 10 MG tablet Take 1 tablet (10 mg total) by mouth 2 (two) times daily as needed. 180 tablet 1  ? ?No current facility-administered medications for this visit.   ? ? ?Medication Side Effects: None ? ?Allergies:  ?Allergies  ?Allergen Reactions  ? Risperidone And Related Other (See Comments)  ?  Faint, dizzy, lethargic   ? Seroquel [Quetiapine] Other (See Comments)  ?  Faint, dizzy, lethargic   ? Depakote Er [Divalproex Sodium Er] Diarrhea  ? Ambien [Zolpidem Tartrate] Other (See Comments)  ?  Hallucinations   ? Lamictal [Lamotrigine] Nausea And Vomiting  ? ? ?Past Medical History:  ?Diagnosis Date  ? Anxiety   ? Bipolar 2 disorder (HCC)   ? Depression   ? Hypertension   ? IBS (irritable bowel syndrome)   ? Night terrors, adult   ? OCD (obsessive compulsive disorder)   ? Panic attacks   ? Posttraumatic stress disorder   ? Social phobia   ? ? ?Past Medical History, Surgical history, Social history, and Family history were reviewed and updated as appropriate.  ? ?Please see review of systems for further details on the patient's review from today.  ? ?Objective:  ? ?Physical Exam:  ?BP 125/84   Pulse 73   Wt 185 lb (83.9 kg)   BMI 25.09 kg/m?  ? ?Physical Exam ?Constitutional:   ?   General: He is not in acute distress. ?Musculoskeletal:     ?   General: No deformity.  ?Neurological:  ?   Mental Status: He is alert and oriented to person, place, and time.  ?   Coordination: Coordination normal.  ?Psychiatric:     ?   Attention and Perception: Attention and perception normal. He does not perceive auditory or visual hallucinations.     ?   Mood and Affect: Mood normal. Mood is not anxious or depressed. Affect is not labile, blunt, angry or inappropriate.     ?   Speech: Speech normal.     ?   Behavior: Behavior normal.     ?   Thought Content: Thought content normal. Thought content is not paranoid or delusional. Thought content does not include homicidal or suicidal ideation. Thought content does not include homicidal or suicidal plan.     ?   Cognition and Memory: Cognition and memory normal.     ?   Judgment: Judgment normal.  ?   Comments: Insight intact  ? ? ?Lab Review:   ?   ?Component Value Date/Time  ? NA 136 11/10/2017 2052  ? K 3.7 11/10/2017 2052  ? CL 100 11/10/2017 2052  ? CO2 23 11/10/2017 2052  ? GLUCOSE 72 11/10/2017 2052  ? BUN 8 11/10/2017 2052  ? CREATININE 0.61 11/10/2017 2052  ? CALCIUM 9.0 11/10/2017 2052  ? PROT 5.5 (L) 01/23/2016 0242  ? ALBUMIN 3.7 01/23/2016 0242  ? AST 25 01/23/2016 0242  ? ALT 19 01/23/2016 0242  ? ALKPHOS 42 01/23/2016 0242  ? BILITOT 1.4 (H) 01/23/2016 0242  ? GFRNONAA >60 11/10/2017 2052  ? GFRAA >60 11/10/2017 2052  ? ? ?   ?Component Value Date/Time  ? WBC 7.1 11/10/2017 2052  ? RBC 4.58 11/10/2017 2052  ? HGB 15.6 11/10/2017 2052  ? HCT 44.5 11/10/2017 2052  ? PLT 275 11/10/2017 2052  ? MCV 97.2 11/10/2017 2052  ? MCH 34.1 (H) 11/10/2017 2052  ?  MCHC 35.1 11/10/2017 2052  ? RDW 13.8 11/10/2017 2052  ? LYMPHSABS 3.1 11/10/2017 2052  ? MONOABS 0.5 11/10/2017 2052  ? EOSABS 0.2 11/10/2017 2052  ? BASOSABS 0.0 11/10/2017 2052  ? ? ?No results found for: POCLITH, LITHIUM  ? ?No results found for: PHENYTOIN, PHENOBARB, VALPROATE, CBMZ  ? ?.res ?Assessment: Plan:   ?Will re-start Armodafinil 150 mg daily for shift work sleep disorder since he reports that this has been helpful for his energy, concentration, and motivation.  ?Will also change Viagra to Cialis since he reports some issues with Viagra. ?Will continue Abilify 20 mg daily for mood s/s. ?Continue Propranolol 10 mg po BID for anxiety and tremor.  ?Continue Gabapentin 400 mg three times daily for anxiety.  ?Continue Wellbutrin XL 300 mg daily for depression.  ?Pt to follow-up in 6 months or sooner if clinically indicated.  ?Patient advised to contact office with any questions, adverse effects, or acute worsening in signs and symptoms. ? ?Nicholas Melton was seen today for other and follow-up. ? ?Diagnoses and all orders for this visit: ? ?Shift work sleep disorder ?-     Armodafinil 150 MG tablet; Take 1/2-1 tablet by mouth at start of work day. ? ?Bipolar 2 disorder (HCC) ?-     ARIPiprazole  (ABILIFY) 20 MG tablet; Take 1 tablet (20 mg total) by mouth daily. ?-     buPROPion (WELLBUTRIN XL) 300 MG 24 hr tablet; Take 1 tablet (300 mg total) by mouth daily. ? ?Anxiety disorder, unspecified type ?-     gabapentin

## 2021-07-27 ENCOUNTER — Ambulatory Visit: Payer: Self-pay | Admitting: Psychiatry

## 2021-12-21 ENCOUNTER — Encounter: Payer: Self-pay | Admitting: Psychiatry

## 2021-12-21 ENCOUNTER — Ambulatory Visit (INDEPENDENT_AMBULATORY_CARE_PROVIDER_SITE_OTHER): Payer: Self-pay | Admitting: Psychiatry

## 2021-12-21 DIAGNOSIS — F419 Anxiety disorder, unspecified: Secondary | ICD-10-CM

## 2021-12-21 DIAGNOSIS — F3181 Bipolar II disorder: Secondary | ICD-10-CM

## 2021-12-21 MED ORDER — TADALAFIL 10 MG PO TABS: 10.0000 mg | ORAL_TABLET | Freq: Every day | ORAL | 5 refills | Status: DC | PRN

## 2021-12-21 MED ORDER — PROPRANOLOL HCL 10 MG PO TABS: 10.0000 mg | ORAL_TABLET | Freq: Two times a day (BID) | ORAL | 1 refills | Status: DC | PRN

## 2021-12-21 MED ORDER — GABAPENTIN 400 MG PO CAPS: 400.0000 mg | ORAL_CAPSULE | Freq: Four times a day (QID) | ORAL | 1 refills | Status: DC

## 2021-12-21 MED ORDER — ARIPIPRAZOLE 20 MG PO TABS: 20.0000 mg | ORAL_TABLET | Freq: Every day | ORAL | 1 refills | Status: DC

## 2021-12-21 MED ORDER — BUPROPION HCL ER (XL) 300 MG PO TB24: 300.0000 mg | ORAL_TABLET | Freq: Every day | ORAL | 1 refills | Status: DC

## 2021-12-21 NOTE — Progress Notes (Unsigned)
Nicholas Melton [id number] 21-Dec-1970 51 y.o.  Subjective:   Patient ID:  Nicholas Melton is a 51 y.o. (DOB 07/12/1970) male.  Chief Complaint:  Chief Complaint  Patient presents with   Follow-up    Bipolar D/O, anxiety, and insomnia    HPI Nicholas Melton presents to the office today for follow-up of Bipolar d/o, anxiety, and insomnia. He reports that he had some manic s/s a few week ago and started taking 4 of gabapentin with some improvement and then ran out early. He reports that he was not able to sleep and gabapentin helped with this. Sleeping 7-8 hours now.Mood has been "ok." He reports that he has been less irritable. He reports that Armodafinil caused some irritability.  Some mild depression. Some anxiety around people he does not know well, ie. new coworkers. Energy has been good. "Motivation is hard." Having to push himself at work at time. Some occ difficulty with concentration when counting. Appetite has been good. He reports some weight gain. He reports some impulses to buy things related to video games. Denies SI.   Working 2nd shift. Work has been ok. Has been there almost 2 years. Has weekends off. Hours were reduced over the summer and this led to some financial stress. He reports that he and his wife are getting along well.   He reports some cravings to use. Denies substance use.   Mother commissioned him to do a painting for his step-father. He reports that it took 3 months for him to do it. Mother did not like painting and this caused some conflict.   Past medication trials: Seroquel-allergic reaction Zyprexa- multiple side effects Risperdal-dissociation Rexulti-felt disoriented Advertising copywriter Abilify-helpful for depression Depakote Lamictal-adverse reaction Carbamazepine- dizziness, lethargic Lithium-lethargic, irritability Gabapentin-helpful for anxiety Trileptal-lethargic, joint pain, back pain BuSpar-adverse reaction Vistaril Anafranil Mirtazapine-helpful for  sleep and night terrors Effexor Paxil Prozac- sexual side effects Viibryd- loose stools Wellbutrin-helpful for depression Naltrexone-nausea Modafanil- jittery. Intolerable side effects.  Armodafinil-increased irritability Propranolol- some benefit with anxiety.   AIMS    Flowsheet Row Office Visit from 12/21/2021 in [facility] Office Visit from 07/06/2021 in [facility] Office Visit from 08/04/2020 in [facility] Office Visit from 02/04/2020 in [facility] Office Visit from 10/02/2019 in [facility]  AIMS Total Score 0 0 0 0 0      Flowsheet Row ED from 11/10/2017 in [facility]GENCY DEPT  C-SSRS RISK CATEGORY High Risk        Review of Systems:  Review of Systems  Gastrointestinal:  Positive for diarrhea.  Musculoskeletal:  Negative for gait problem.  Neurological:  Negative for tremors.  Psychiatric/Behavioral:         Please refer to HPI    Medications: I have reviewed the patient's current medications.  Current Outpatient Medications  Medication Sig Dispense Refill   loperamide (IMODIUM) 2 MG capsule Take by mouth as needed for diarrhea or loose stools.     ARIPiprazole (ABILIFY) 20 MG tablet Take 1 tablet (20 mg total) by mouth daily. 90 tablet 1   buPROPion (WELLBUTRIN XL) 300 MG 24 hr tablet Take 1 tablet (300 mg total) by mouth daily. 90 tablet 1   gabapentin (NEURONTIN) 400 MG capsule Take 1 capsule (400 mg total) by mouth 4 (four) times daily. 270 capsule 1   propranolol (INDERAL) 10 MG tablet Take 1 tablet (10 mg total) by mouth 2 (two) times daily as needed. 180 tablet 1   tadalafil (CIALIS) 10  MG tablet Take 1 tablet (10 mg total) by mouth daily as needed for erectile dysfunction. 30 tablet 5   No current facility-administered medications for this visit.    Medication Side Effects: Other: Irritability with Armodafinil  Allergies:  Allergies  Allergen  Reactions   Risperidone And Related Other (See Comments)    Faint, dizzy, lethargic    Seroquel [Quetiapine] Other (See Comments)    Faint, dizzy, lethargic    Depakote Er [Divalproex Sodium Er] Diarrhea   Ambien [Zolpidem Tartrate] Other (See Comments)    Hallucinations    Lamictal [Lamotrigine] Nausea And Vomiting    Past Medical History:  Diagnosis Date   Anxiety    Bipolar 2 disorder (HCC)    Depression    Hypertension    IBS (irritable bowel syndrome)    Night terrors, adult    OCD (obsessive compulsive disorder)    Panic attacks    Posttraumatic stress disorder    Social phobia     Past Medical History, Surgical history, Social history, and Family history were reviewed and updated as appropriate.   Please see review of systems for further details on the patient's review from today.   Objective:   Physical Exam:  BP (!) 146/86   Pulse (!) 56   Wt 175 lb (79.4 kg)   BMI 23.73 kg/m   Physical Exam Constitutional:      General: He is not in acute distress. Musculoskeletal:        General: No deformity.  Neurological:     Mental Status: He is alert and oriented to person, place, and time.     Coordination: Coordination normal.  Psychiatric:        Attention and Perception: Attention and perception normal. He does not perceive auditory or visual hallucinations.        Mood and Affect: Mood normal. Mood is not anxious or depressed. Affect is not labile, blunt, angry or inappropriate.        Speech: Speech normal.        Behavior: Behavior normal.        Thought Content: Thought content normal. Thought content is not paranoid or delusional. Thought content does not include homicidal or suicidal ideation. Thought content does not include homicidal or suicidal plan.        Cognition and Memory: Cognition and memory normal.        Judgment: Judgment normal.     Comments: Insight intact     Lab Review:     Component Value Date/Time   NA 136 11/10/2017 2052   K  3.7 11/10/2017 2052   CL 100 11/10/2017 2052   CO2 23 11/10/2017 2052   GLUCOSE 72 11/10/2017 2052   BUN 8 11/10/2017 2052   CREATININE 0.61 11/10/2017 2052   CALCIUM 9.0 11/10/2017 2052   PROT 5.5 (L) 01/23/2016 0242   ALBUMIN 3.7 01/23/2016 0242   AST 25 01/23/2016 0242   ALT 19 01/23/2016 0242   ALKPHOS 42 01/23/2016 0242   BILITOT 1.4 (H) 01/23/2016 0242   GFRNONAA >60 11/10/2017 2052   GFRAA >60 11/10/2017 2052       Component Value Date/Time   WBC 7.1 11/10/2017 2052   RBC 4.58 11/10/2017 2052   HGB 15.6 11/10/2017 2052   HCT 44.5 11/10/2017 2052   PLT 275 11/10/2017 2052   MCV 97.2 11/10/2017 2052   MCH 34.1 (H) 11/10/2017 2052   MCHC 35.1 11/10/2017 2052   RDW 13.8 11/10/2017 2052   LYMPHSABS  3.1 11/10/2017 2052   MONOABS 0.5 11/10/2017 2052   EOSABS 0.2 11/10/2017 2052   BASOSABS 0.0 11/10/2017 2052    No results found for: "POCLITH", "LITHIUM"   No results found for: "PHENYTOIN", "PHENOBARB", "VALPROATE", "CBMZ"   .res Assessment: Plan:   Will increase Gabapentin 400 mg up to four times daily. Recommended taking Gabapentin during the day to help with social anxiety at work since he reports that he has typically been taking Gabapentin all at night.  Continue Abilify 20 mg po qd for mood stabilization.  Continue Wellbutrin XL 300 mg po qd for depression.  Continue Propranolol 10 mg po BID prn anxiety.  Continue Cialis 10 mg po qd prn.  Pt to follow-up in 6 months or sooner if clinically indicated.  Patient advised to contact office with any questions, adverse effects, or acute worsening in signs and symptoms.  Jestin was seen today for follow-up.  Diagnoses and all orders for this visit:  Anxiety disorder, unspecified type -     gabapentin (NEURONTIN) 400 MG capsule; Take 1 capsule (400 mg total) by mouth 4 (four) times daily. -     propranolol (INDERAL) 10 MG tablet; Take 1 tablet (10 mg total) by mouth 2 (two) times daily as needed.  Bipolar 2  disorder (HCC) -     buPROPion (WELLBUTRIN XL) 300 MG 24 hr tablet; Take 1 tablet (300 mg total) by mouth daily. -     ARIPiprazole (ABILIFY) 20 MG tablet; Take 1 tablet (20 mg total) by mouth daily.  Other orders -     tadalafil (CIALIS) 10 MG tablet; Take 1 tablet (10 mg total) by mouth daily as needed for erectile dysfunction.     Please see After Visit Summary for patient specific instructions.  Future Appointments  Date Time Provider Department Center  06/21/2022  1:00 PM [name], PMHNP [facility] None    No orders of the defined types were placed in this encounter.   -------------------------------

## 2022-01-04 ENCOUNTER — Ambulatory Visit: Payer: Self-pay | Admitting: Psychiatry

## 2022-03-03 ENCOUNTER — Telehealth: Payer: Self-pay | Admitting: Psychiatry

## 2022-03-03 NOTE — Telephone Encounter (Signed)
Pt called back at 3:37p to say disregard the original message.  He has his Gabapentin.

## 2022-03-03 NOTE — Telephone Encounter (Signed)
Pt is going to run out of his gabapentin in a few days. The pharmacy says it is to early and can't fill until 1/18. He said he got  270 pills on 10/9. Can you call the pharmacy and see what can be done

## 2022-05-11 ENCOUNTER — Other Ambulatory Visit: Payer: Self-pay | Admitting: Psychiatry

## 2022-05-11 DIAGNOSIS — F419 Anxiety disorder, unspecified: Secondary | ICD-10-CM

## 2022-06-21 ENCOUNTER — Encounter: Payer: Self-pay | Admitting: Psychiatry

## 2022-06-21 ENCOUNTER — Ambulatory Visit: Payer: Self-pay | Admitting: Psychiatry

## 2022-06-21 DIAGNOSIS — F3181 Bipolar II disorder: Secondary | ICD-10-CM

## 2022-06-21 DIAGNOSIS — F419 Anxiety disorder, unspecified: Secondary | ICD-10-CM

## 2022-06-21 MED ORDER — PROPRANOLOL HCL 10 MG PO TABS: 10.0000 mg | ORAL_TABLET | Freq: Two times a day (BID) | ORAL | 1 refills | Status: DC | PRN

## 2022-06-21 MED ORDER — GABAPENTIN 400 MG PO CAPS
400.0000 mg | ORAL_CAPSULE | Freq: Four times a day (QID) | ORAL | 1 refills | Status: DC
Start: 2022-06-21 — End: 2022-10-20

## 2022-06-21 MED ORDER — ARIPIPRAZOLE 20 MG PO TABS
20.0000 mg | ORAL_TABLET | Freq: Every day | ORAL | 1 refills | Status: DC
Start: 2022-06-21 — End: 2022-10-20

## 2022-06-21 MED ORDER — BUPROPION HCL ER (XL) 300 MG PO TB24
300.0000 mg | ORAL_TABLET | Freq: Every day | ORAL | 1 refills | Status: DC
Start: 2022-06-21 — End: 2022-10-20

## 2022-06-21 MED ORDER — TADALAFIL 10 MG PO TABS: 10.0000 mg | ORAL_TABLET | Freq: Every day | ORAL | 5 refills | Status: DC | PRN

## 2022-06-21 NOTE — Progress Notes (Signed)
Nicholas Melton [id number] February 18, 1971 52 y.o.  Subjective:   Patient ID:  Nicholas Melton is a 52 y.o. (DOB 1970-11-12) male.  Chief Complaint:  Chief Complaint  Patient presents with   Follow-up    Bipolar disorder, anxiety    HPI Nicholas Melton presents to the office today for follow-up of Bipolar disorder, anxiety, and insomnia.   He reports increased spending around the holidays and this has resulted in credit card debt. He reports that he may have had some mild hypomania around that time. He denies any other "major issues with my mood or behavior." He reports some mild depression in response to some family stressors. He reports some worry about family members. Denies excessive worry. Denies recent panic attacks. He reports some IBS symptoms related to anxiety. Denies headaches with anxiety. He noticed some muscle tension yesterday after family stressor. He reports that he has been sleeping ok, except for being awakened by cats. Appetite has been good. Energy has been somewhat low. "It's tough for me to stay motivated" and has to push himself to do things. He reports that he also has to make sure he is focused. He reports that a few times he has been "dazed" and lost track of the task he was working on. Denies anhedonia. Denies SI.   He reports that he and his mother are "kind of on the outs." She has been helping him financially with medical costs. He reports he has a good relationship with his father. Father is separated and lives in [place]. He reports that he and his wife have been getting along well. He reports some financial stressors.   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 Has been at same job for 2.5 years.   Last ETOH use was 2018. He reports some cravings for ETOH on weekends. He reports that his wife is helpful for getting through these cravings.  He reports playing video games are helpful for distracting him from cravings.   Past medication trials: Seroquel-allergic reaction Zyprexa- multiple side effects Risperdal-dissociation Rexulti-felt disoriented Advertising copywriter Abilify-helpful for depression Depakote Lamictal-adverse reaction Carbamazepine- dizziness, lethargic Lithium-lethargic, irritability Gabapentin-helpful for anxiety Trileptal-lethargic, joint pain, back pain BuSpar-adverse reaction Vistaril Anafranil Mirtazapine-helpful for sleep and night terrors Effexor Paxil Prozac- sexual side effects Viibryd- loose stools Wellbutrin-helpful for depression Naltrexone-nausea Modafanil- jittery. Intolerable side effects.  Armodafinil-increased irritability Propranolol- some benefit with anxiety.   AIMS    Flowsheet Row Office Visit from 06/21/2022 in [facility] Office Visit from 12/21/2021 in [facility] Office Visit from 07/06/2021 in [facility] Office Visit from 08/04/2020 in [facility] Office Visit from 02/04/2020 in [facility]  AIMS Total Score 1 0 0 0 0      Flowsheet Row ED from 11/10/2017 in [facility] Emergency Department at [facility]  C-SSRS RISK CATEGORY High Risk        Review of Systems:  Review of Systems  Gastrointestinal:  Positive for diarrhea.  Musculoskeletal:  Negative for gait problem.  Neurological:  Negative for tremors and headaches.  Psychiatric/Behavioral:         Please refer to HPI    Medications: I have reviewed the patient's current medications.  Current Outpatient Medications  Medication Sig Dispense Refill   cetirizine (ZYRTEC) 10  MG tablet Take 10 mg by mouth daily as needed for allergies.     loperamide (IMODIUM) 2 MG capsule Take by mouth as needed for diarrhea or loose stools.     ARIPiprazole (ABILIFY) 20 MG tablet  Take 1 tablet (20 mg total) by mouth daily. 90 tablet 1   buPROPion (WELLBUTRIN XL) 300 MG 24 hr tablet Take 1 tablet (300 mg total) by mouth daily. 90 tablet 1   gabapentin (NEURONTIN) 400 MG capsule Take 1 capsule (400 mg total) by mouth 4 (four) times daily. 270 capsule 1   propranolol (INDERAL) 10 MG tablet Take 1 tablet (10 mg total) by mouth 2 (two) times daily as needed. 180 tablet 1   tadalafil (CIALIS) 10 MG tablet Take 1 tablet (10 mg total) by mouth daily as needed for erectile dysfunction. 30 tablet 5   No current facility-administered medications for this visit.    Medication Side Effects: None  Allergies:  Allergies  Allergen Reactions   Risperidone And Related Other (See Comments)    Faint, dizzy, lethargic    Seroquel [Quetiapine] Other (See Comments)    Faint, dizzy, lethargic    Depakote Er [Divalproex Sodium Er] Diarrhea   Ambien [Zolpidem Tartrate] Other (See Comments)    Hallucinations    Lamictal [Lamotrigine] Nausea And Vomiting    Past Medical History:  Diagnosis Date   Anxiety    Bipolar 2 disorder    Depression    Hypertension    IBS (irritable bowel syndrome)    Night terrors, adult    OCD (obsessive compulsive disorder)    Panic attacks    Posttraumatic stress disorder    Social phobia     Past Medical History, Surgical history, Social history, and Family history were reviewed and updated as appropriate.   Please see review of systems for further details on the patient's review from today.   Objective:   Physical Exam:  BP 121/83   Pulse 64   Wt 185 lb (83.9 kg)   BMI 25.09 kg/m   Physical Exam Constitutional:      General: He is not in acute distress. Musculoskeletal:        General: No deformity.  Neurological:     Mental Status: He is alert and oriented to person, place, and time.     Coordination: Coordination normal.  Psychiatric:        Attention and Perception: Attention and perception normal. He does not perceive auditory  or visual hallucinations.        Mood and Affect: Mood normal. Mood is not anxious or depressed. Affect is not labile, blunt, angry or inappropriate.        Speech: Speech normal.        Behavior: Behavior normal.        Thought Content: Thought content normal. Thought content is not paranoid or delusional. Thought content does not include homicidal or suicidal ideation. Thought content does not include homicidal or suicidal plan.        Cognition and Memory: Cognition and memory normal.        Judgment: Judgment normal.     Comments: Insight intact     Lab Review:     Component Value Date/Time   NA 136 11/10/2017 2052   K 3.7 11/10/2017 2052   CL 100 11/10/2017 2052   CO2 23 11/10/2017 2052   GLUCOSE 72 11/10/2017 2052   BUN 8 11/10/2017 2052   CREATININE 0.61 11/10/2017 2052   CALCIUM 9.0  11/10/2017 2052   PROT 5.5 (L) 01/23/2016 0242   ALBUMIN 3.7 01/23/2016 0242   AST 25 01/23/2016 0242   ALT 19 01/23/2016 0242   ALKPHOS 42 01/23/2016 0242   BILITOT 1.4 (H) 01/23/2016 0242   GFRNONAA >60 11/10/2017 2052   GFRAA >60 11/10/2017 2052       Component Value Date/Time   WBC 7.1 11/10/2017 2052   RBC 4.58 11/10/2017 2052   HGB 15.6 11/10/2017 2052   HCT 44.5 11/10/2017 2052   PLT 275 11/10/2017 2052   MCV 97.2 11/10/2017 2052   MCH 34.1 (H) 11/10/2017 2052   MCHC 35.1 11/10/2017 2052   RDW 13.8 11/10/2017 2052   LYMPHSABS 3.1 11/10/2017 2052   MONOABS 0.5 11/10/2017 2052   EOSABS 0.2 11/10/2017 2052   BASOSABS 0.0 11/10/2017 2052    No results found for: "POCLITH", "LITHIUM"   No results found for: "PHENYTOIN", "PHENOBARB", "VALPROATE", "CBMZ"   .res Assessment: Plan:   Discussed potential metabolic side effects with abilify and recommended obtaining lab work to monitor glucose and cholesterol. He reports that he has been considering going to [facility] medical clinic that offers healthcare on a sliding scale. He reports that he will follow through with this to request  labs.  Continue Abilify 20 mg daily for mood stabilization.  Continue Wellbutrin XL 300 mg po qd for depression.  Continue Gabapentin 400 mg four times daily for anxiety.  Continue Propranolol 10 mg po BID prn anxiety.  Continue Cialis as needed.  Pt to follow-up in 6 months or sooner if clinically indicated.  Patient advised to contact office with any questions, adverse effects, or acute worsening in signs and symptoms.   Nicholas Melton was seen today for follow-up.  Diagnoses and all orders for this visit:  Bipolar 2 disorder -     ARIPiprazole (ABILIFY) 20 MG tablet; Take 1 tablet (20 mg total) by mouth daily. -     buPROPion (WELLBUTRIN XL) 300 MG 24 hr tablet; Take 1 tablet (300 mg total) by mouth daily.  Anxiety disorder, unspecified type -     gabapentin (NEURONTIN) 400 MG capsule; Take 1 capsule (400 mg total) by mouth 4 (four) times daily. -     propranolol (INDERAL) 10 MG tablet; Take 1 tablet (10 mg total) by mouth 2 (two) times daily as needed.  Other orders -     tadalafil (CIALIS) 10 MG tablet; Take 1 tablet (10 mg total) by mouth daily as needed for erectile dysfunction.     Please see After Visit Summary for patient specific instructions.  Future Appointments  Date Time Provider Department Center  12/20/2022  1:00 PM [name], PMHNP [facility] None    No orders of the defined types were placed in this encounter.   -------------------------------

## 2022-10-20 ENCOUNTER — Ambulatory Visit (INDEPENDENT_AMBULATORY_CARE_PROVIDER_SITE_OTHER): Payer: Self-pay | Admitting: Psychiatry

## 2022-10-20 ENCOUNTER — Encounter: Payer: Self-pay | Admitting: Psychiatry

## 2022-10-20 VITALS — BP 108/61 | HR 57

## 2022-10-20 DIAGNOSIS — F419 Anxiety disorder, unspecified: Secondary | ICD-10-CM

## 2022-10-20 DIAGNOSIS — G4726 Circadian rhythm sleep disorder, shift work type: Secondary | ICD-10-CM

## 2022-10-20 DIAGNOSIS — F3181 Bipolar II disorder: Secondary | ICD-10-CM

## 2022-10-20 MED ORDER — TADALAFIL 10 MG PO TABS: 10.0000 mg | ORAL_TABLET | Freq: Every day | ORAL | 5 refills | Status: DC | PRN

## 2022-10-20 MED ORDER — BUPROPION HCL ER (XL) 150 MG PO TB24
150.0000 mg | ORAL_TABLET | Freq: Every day | ORAL | 0 refills | Status: DC
Start: 2022-10-20 — End: 2023-05-05

## 2022-10-20 MED ORDER — ARIPIPRAZOLE 20 MG PO TABS
20.0000 mg | ORAL_TABLET | Freq: Every day | ORAL | 1 refills | Status: DC
Start: 2022-10-20 — End: 2023-05-05

## 2022-10-20 MED ORDER — PROPRANOLOL HCL 10 MG PO TABS
10.0000 mg | ORAL_TABLET | Freq: Two times a day (BID) | ORAL | 1 refills | Status: DC | PRN
Start: 2022-10-20 — End: 2023-05-05

## 2022-10-20 MED ORDER — BUPROPION HCL ER (XL) 300 MG PO TB24
300.0000 mg | ORAL_TABLET | Freq: Every day | ORAL | 1 refills | Status: DC
Start: 2022-10-20 — End: 2023-05-05

## 2022-10-20 MED ORDER — GABAPENTIN 400 MG PO CAPS
ORAL_CAPSULE | ORAL | 1 refills | Status: DC
Start: 2022-10-20 — End: 2023-05-05

## 2022-10-20 NOTE — Progress Notes (Signed)
Nicholas Melton [id number] 1971-02-23 52 y.o.  Subjective:   Patient ID:  Nicholas Melton is a 52 y.o. (DOB 1970/06/02) male.  Chief Complaint:  Chief Complaint  Patient presents with   Other    Difficulty with concentration, excessive daytime somnolence   Follow-up    Bipolar Disorder, Anxiety    HPI Nicholas Melton presents to the office today for follow-up of Bipolar Disorder, anxiety, and insomnia.   He reports that he has been "having some problems at work- trouble focusing, I just doze off." He reports, "I can fall asleep standing up." He reports that he notices he is daydreaming on the job. 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 He denies making more mistakes.   He reports that his mood is stable overall. Denies depressed mood. He reports that his wife has been concerned that he has had some manic symptoms a couple of times, with not wanting to sleep as much. He does not recall her mentioning any other possible manic symptoms. He reports that he impulsively took off work when given the option and now regrets this because "I could have used the money." He reports that he impulsively spent money on a video game about 3 weeks ago. He reports that he is typically getting 8 hours of sleep at night. Typically sleeps from 5:30 am- 1 pm. He reports that his wife has told him that he will snore when he has fallen asleep on the couch. He is not aware of any times he is gasping or stops breathing in his sleep. Reports that he feels rested upon awakening. Denies restless legs. Energy and motivation have been low. He enjoys streaming movies and playing video games. Denies SI.   He reports some alcohol cravings on the weekends "when I get bored. Denies any recent ETOH use.   He works evening shifts.   Past medication trials: Seroquel-allergic reaction Zyprexa- multiple side effects Risperdal-dissociation Rexulti-felt  disoriented Advertising copywriter Abilify-helpful for depression Depakote Lamictal-adverse reaction Carbamazepine- dizziness, lethargic Lithium-lethargic, irritability Gabapentin-helpful for anxiety Trileptal-lethargic, joint pain, back pain BuSpar-adverse reaction Vistaril Anafranil Mirtazapine-helpful for sleep and night terrors Effexor Paxil Prozac- sexual side effects Viibryd- loose stools Wellbutrin-helpful for depression Naltrexone-nausea Modafanil- jittery. Intolerable side effects.  Armodafinil-increased irritability Propranolol- some benefit with anxiety.   AIMS    Flowsheet Row Office Visit from 10/20/2022 in [facility] Office Visit from 06/21/2022 in [facility] Office Visit from 12/21/2021 in [facility] Office Visit from 07/06/2021 in [facility] Office Visit from 08/04/2020 in [facility]  AIMS Total Score 0 1 0 0 0      Flowsheet Row ED from 11/10/2017 in [facility] Emergency Department at [facility]  C-SSRS RISK CATEGORY High Risk        Review of Systems:  Review of Systems  Gastrointestinal:        Reports IBS.  Musculoskeletal:  Negative for gait problem.  Neurological:  Negative for dizziness, tremors and light-headedness.  Psychiatric/Behavioral:         Please refer to HPI    Medications: I have reviewed the patient's current medications.  Current Outpatient Medications  Medication Sig Dispense Refill   buPROPion (WELLBUTRIN XL) 150 MG 24 hr tablet Take 1 tablet (150 mg total) by mouth daily. Take with a 300 mg tablet to equal total dose of 450  mg. 30 tablet 0   loperamide (IMODIUM) 2 MG capsule Take by mouth as needed for diarrhea or loose stools.     ARIPiprazole (ABILIFY) 20 MG tablet Take 1 tablet (20 mg total) by mouth daily. 90 tablet 1   buPROPion (WELLBUTRIN XL) 300 MG 24 hr tablet Take 1  tablet (300 mg total) by mouth daily. Take 1 tablet with a 150 mg tablet to equal total daily dose of 450 mg. 90 tablet 1   cetirizine (ZYRTEC) 10 MG tablet Take 10 mg by mouth daily as needed for allergies. (Patient not taking: Reported on 10/20/2022)     gabapentin (NEURONTIN) 400 MG capsule Take 1 capsule 3-4 times daily 360 capsule 1   propranolol (INDERAL) 10 MG tablet Take 1 tablet (10 mg total) by mouth 2 (two) times daily as needed. 180 tablet 1   tadalafil (CIALIS) 10 MG tablet Take 1 tablet (10 mg total) by mouth daily as needed for erectile dysfunction. 30 tablet 5   No current facility-administered medications for this visit.    Medication Side Effects: None  Allergies:  Allergies  Allergen Reactions   Risperidone And Related Other (See Comments)    Faint, dizzy, lethargic    Seroquel [Quetiapine] Other (See Comments)    Faint, dizzy, lethargic    Depakote Er [Divalproex Sodium Er] Diarrhea   Ambien [Zolpidem Tartrate] Other (See Comments)    Hallucinations    Lamictal [Lamotrigine] Nausea And Vomiting    Past Medical History:  Diagnosis Date   Anxiety    Bipolar 2 disorder (HCC)    Depression    Hypertension    IBS (irritable bowel syndrome)    Night terrors, adult    OCD (obsessive compulsive disorder)    Panic attacks    Posttraumatic stress disorder    Social phobia     Past Medical History, Surgical history, Social history, and Family history were reviewed and updated as appropriate.   Please see review of systems for further details on the patient's review from today.   Objective:   Physical Exam:  BP 108/61   Pulse (!) 57   Physical Exam Constitutional:      General: He is not in acute distress. Musculoskeletal:        General: No deformity.  Neurological:     Mental Status: He is alert and oriented to person, place, and time.     Coordination: Coordination normal.  Psychiatric:        Attention and Perception: Attention and perception normal.  He does not perceive auditory or visual hallucinations.        Mood and Affect: Mood normal. Mood is not anxious or depressed. Affect is not labile, blunt, angry or inappropriate.        Speech: Speech normal.        Behavior: Behavior normal.        Thought Content: Thought content normal. Thought content is not paranoid or delusional. Thought content does not include homicidal or suicidal ideation. Thought content does not include homicidal or suicidal plan.        Cognition and Memory: Cognition and memory normal.        Judgment: Judgment normal.     Comments: Insight intact     Lab Review:     Component Value Date/Time   NA 136 11/10/2017 2052   K 3.7 11/10/2017 2052   CL 100 11/10/2017 2052   CO2 23 11/10/2017 2052   GLUCOSE 72 11/10/2017 2052  BUN 8 11/10/2017 2052   CREATININE 0.61 11/10/2017 2052   CALCIUM 9.0 11/10/2017 2052   PROT 5.5 (L) 01/23/2016 0242   ALBUMIN 3.7 01/23/2016 0242   AST 25 01/23/2016 0242   ALT 19 01/23/2016 0242   ALKPHOS 42 01/23/2016 0242   BILITOT 1.4 (H) 01/23/2016 0242   GFRNONAA >60 11/10/2017 2052   GFRAA >60 11/10/2017 2052       Component Value Date/Time   WBC 7.1 11/10/2017 2052   RBC 4.58 11/10/2017 2052   HGB 15.6 11/10/2017 2052   HCT 44.5 11/10/2017 2052   PLT 275 11/10/2017 2052   MCV 97.2 11/10/2017 2052   MCH 34.1 (H) 11/10/2017 2052   MCHC 35.1 11/10/2017 2052   RDW 13.8 11/10/2017 2052   LYMPHSABS 3.1 11/10/2017 2052   MONOABS 0.5 11/10/2017 2052   EOSABS 0.2 11/10/2017 2052   BASOSABS 0.0 11/10/2017 2052    No results found for: "POCLITH", "LITHIUM"   No results found for: "PHENYTOIN", "PHENOBARB", "VALPROATE", "CBMZ"   .res Assessment: Plan:    Discussed treatment options for daytime somnolence and impaired concentration. Discussed that he was not able to tolerate Armodafinil or Modafinil in the past for shift work sleep disorder and excessive daytime somnolence. Discussed that Sunosi is also indicated for  excessive daytime somnolence and that there is not a generic available. Pt reports that Sunosi brand name would likely be cost prohibitive. Discussed potential benefits, risks, and side effects of increasing Wellbutrin XL to 450 mg. Discussed that higher dose may be helpful for concentration, energy, and alertness. Discussed that he could resume 300 mg dose if unable to tolerate 450 mg dose. Recommended patient request 90-day supply of 150 mg tabs if this is effective and well-tolerated.  Discussed that he may wish to consider decreasing Gabapentin 400 mg to three times daily to determine if this reduces daytime somnolence.  Continue Abilify 20 mg daily for mood symptoms.  Continue Propranolol 10 mg po BID prn anxiety.  Continue Cialis prn due to medication side effects.  Pt to follow-up in 6 months or sooner if clinically indicated.  Patient advised to contact office with any questions, adverse effects, or acute worsening in signs and symptoms.   Nicholas Melton was seen today for other and follow-up.  Diagnoses and all orders for this visit:  Bipolar 2 disorder (HCC) -     buPROPion (WELLBUTRIN XL) 150 MG 24 hr tablet; Take 1 tablet (150 mg total) by mouth daily. Take with a 300 mg tablet to equal total dose of 450 mg. -     buPROPion (WELLBUTRIN XL) 300 MG 24 hr tablet; Take 1 tablet (300 mg total) by mouth daily. Take 1 tablet with a 150 mg tablet to equal total daily dose of 450 mg. -     ARIPiprazole (ABILIFY) 20 MG tablet; Take 1 tablet (20 mg total) by mouth daily.  Anxiety disorder, unspecified type -     gabapentin (NEURONTIN) 400 MG capsule; Take 1 capsule 3-4 times daily -     propranolol (INDERAL) 10 MG tablet; Take 1 tablet (10 mg total) by mouth 2 (two) times daily as needed.  Shift work sleep disorder  Other orders -     tadalafil (CIALIS) 10 MG tablet; Take 1 tablet (10 mg total) by mouth daily as needed for erectile dysfunction.     Please see After Visit Summary for patient  specific instructions.  Future Appointments  Date Time Provider Department Center  04/18/2023  1:00 PM Montez Morita,  Shanda Bumps, PMHNP [facility] None    No orders of the defined types were placed in this encounter.   -------------------------------

## 2022-12-20 ENCOUNTER — Ambulatory Visit: Payer: Self-pay | Admitting: Psychiatry

## 2023-01-26 ENCOUNTER — Encounter: Payer: Self-pay | Admitting: Psychiatry

## 2023-04-18 ENCOUNTER — Ambulatory Visit: Payer: Self-pay | Admitting: Psychiatry

## 2023-05-05 ENCOUNTER — Encounter: Payer: Self-pay | Admitting: Physician Assistant

## 2023-05-05 ENCOUNTER — Ambulatory Visit (INDEPENDENT_AMBULATORY_CARE_PROVIDER_SITE_OTHER): Payer: Self-pay | Admitting: Physician Assistant

## 2023-05-05 VITALS — BP 128/80 | HR 62

## 2023-05-05 DIAGNOSIS — G4726 Circadian rhythm sleep disorder, shift work type: Secondary | ICD-10-CM

## 2023-05-05 DIAGNOSIS — F3181 Bipolar II disorder: Secondary | ICD-10-CM

## 2023-05-05 DIAGNOSIS — F419 Anxiety disorder, unspecified: Secondary | ICD-10-CM

## 2023-05-05 MED ORDER — GABAPENTIN 400 MG PO CAPS: 400.0000 mg | ORAL_CAPSULE | Freq: Four times a day (QID) | ORAL | 1 refills | Status: DC

## 2023-05-05 MED ORDER — AMPHETAMINE-DEXTROAMPHETAMINE 5 MG PO TABS: 5.0000 mg | ORAL_TABLET | Freq: Every day | ORAL | 0 refills | Status: DC

## 2023-05-05 MED ORDER — BUPROPION HCL ER (XL) 300 MG PO TB24: 300.0000 mg | ORAL_TABLET | Freq: Every day | ORAL | 1 refills | Status: DC

## 2023-05-05 MED ORDER — PROPRANOLOL HCL 10 MG PO TABS: 10.0000 mg | ORAL_TABLET | Freq: Two times a day (BID) | ORAL | 1 refills | Status: DC | PRN

## 2023-05-05 MED ORDER — ARIPIPRAZOLE 20 MG PO TABS: 20.0000 mg | ORAL_TABLET | Freq: Every day | ORAL | 1 refills | Status: DC

## 2023-05-05 MED ORDER — TADALAFIL 10 MG PO TABS: 10.0000 mg | ORAL_TABLET | Freq: Every day | ORAL | 5 refills | Status: AC | PRN

## 2023-05-05 NOTE — Progress Notes (Signed)
 [facility]  Patient ID: Nicholas Melton,  MRN: [id number]  PCP: Patient, No Pcp Per  Date of Evaluation: 05/05/2023 Time spent:20 minutes  Chief Complaint:  Chief Complaint   Depression; Follow-up; ADD     HISTORY/CURRENT STATUS: HPI transferring from Nicholas Chiquito, NP who is no longer with our practice.  Nicholas Melton was last seen 6 months ago.  At that time he was doing well so no changes were made in his medications.  Since that time he has suffered with daytime fatigue, has a hard time focusing and sometimes gets extremely sleepy at work he will nod off.  He works at  [employer], his job is repetitious so it is easy for him to zone out.  He works night shift so that makes it a little worse too.  Stimulant-tried several times, helped, but his wife said he wasn't himself. He didn't notice it though.  Patient is able to enjoy things.  Energy and motivation are good.  Work is going well.   No extreme sadness, tearfulness, or feelings of hopelessness.  Sleeps well most of the time. ADLs and personal hygiene are normal.  No change in memory.  Appetite has not changed.  Weight is stable.    Anxiety is well controlled with the gabapentin and propranolol which she takes as needed.  Denies suicidal or homicidal thoughts.  Patient denies increased energy with decreased need for sleep, increased talkativeness, racing thoughts, impulsivity or risky behaviors, increased spending, increased libido, grandiosity, increased irritability or anger, paranoia, or hallucinations.  Denies dizziness, syncope, seizures, numbness, tingling, tremor, tics, unsteady gait, slurred speech, confusion. Denies muscle or joint pain, stiffness, or dystonia. Denies unexplained weight loss, frequent infections, or sores that heal slowly.  No polyphagia, polydipsia, or polyuria. Denies visual changes or paresthesias.   Individual Medical History/ Review of Systems: Changes? :No   Past medication trials: Seroquel-allergic  reaction Zyprexa- multiple side effects Risperdal-dissociation Rexulti-felt disoriented Latuda Vraylar Abilify-helpful for depression Depakote Lamictal-adverse reaction Carbamazepine- dizziness, lethargic Lithium-lethargic, irritability Gabapentin-helpful for anxiety Trileptal-lethargic, joint pain, back pain BuSpar-adverse reaction Vistaril Anafranil Mirtazapine-helpful for sleep and night terrors Effexor Paxil Prozac- sexual side effects Viibryd- loose stools Wellbutrin-helpful for depression Naltrexone-nausea Modafanil- jittery. Intolerable side effects.  Armodafinil-increased irritability Propranolol- some benefit with anxiety.   Allergies: Risperidone and related, Seroquel [quetiapine], Depakote er [divalproex sodium er], Ambien [zolpidem tartrate], and Lamictal [lamotrigine]  Current Medications:  Current Outpatient Medications:    amphetamine-dextroamphetamine (ADDERALL) 5 MG tablet, Take 1 tablet (5 mg total) by mouth daily., Disp: 30 tablet, Rfl: 0   cetirizine (ZYRTEC) 10 MG tablet, Take 10 mg by mouth daily as needed for allergies., Disp: , Rfl:    loperamide (IMODIUM) 2 MG capsule, Take by mouth as needed for diarrhea or loose stools., Disp: , Rfl:    ARIPiprazole (ABILIFY) 20 MG tablet, Take 1 tablet (20 mg total) by mouth daily., Disp: 90 tablet, Rfl: 1   buPROPion (WELLBUTRIN XL) 300 MG 24 hr tablet, Take 1 tablet (300 mg total) by mouth daily., Disp: 90 tablet, Rfl: 1   gabapentin (NEURONTIN) 400 MG capsule, Take 1 capsule (400 mg total) by mouth 4 (four) times daily., Disp: 360 capsule, Rfl: 1   propranolol (INDERAL) 10 MG tablet, Take 1 tablet (10 mg total) by mouth 2 (two) times daily as needed., Disp: 180 tablet, Rfl: 1   tadalafil (CIALIS) 10 MG tablet, Take 1 tablet (10 mg total) by mouth daily as needed for erectile dysfunction., Disp: 30 tablet, Rfl: 5 Medication  Side Effects: none  Family Medical/ Social History: Changes? No  MENTAL HEALTH  EXAM:  Blood pressure 128/80, pulse 62.There is no height or weight on file to calculate BMI.  General Appearance: Casual and Well Groomed  Eye Contact:  Good  Speech:  Clear and Coherent and Normal Rate  Volume:  Normal  Mood:  Euthymic  Affect:  Congruent  Thought Process:  Goal Directed and Descriptions of Associations: Circumstantial  Orientation:  Full (Time, Place, and Person)  Thought Content: Logical   Suicidal Thoughts:  No  Homicidal Thoughts:  No  Memory:  WNL  Judgement:  Good  Insight:  Good  Psychomotor Activity:  Normal  Concentration:  Concentration: Good  Recall:  Good  Fund of Knowledge: Good  Language: Good  Assets:  Desire for Improvement Financial Resources/Insurance Housing Transportation Vocational/Educational  ADL's:  Intact  Cognition: WNL  Prognosis:  Good   DIAGNOSES:    ICD-10-CM   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 Bipolar 2 disorder (HCC)  F31.81 ARIPiprazole (ABILIFY) 20 MG tablet    buPROPion (WELLBUTRIN XL) 300 MG 24 hr tablet    3. Anxiety disorder, unspecified type  F41.9 gabapentin (NEURONTIN) 400 MG capsule    propranolol (INDERAL) 10 MG tablet     Receiving Psychotherapy: No   RECOMMENDATIONS:   PDMP reviewed.  Gabapentin filled 02/07/2023. I provided 20 minutes of face to face time during this encounter, including time spent before and after the visit in records review, medical decision making, counseling pertinent to today's visit, and charting.   We discussed his victims of attention deficit, lack of energy and motivation.  The Wellbutrin does help some.  That was increased to maximum dose but he did not tolerate that well.  I recommend retrying a stimulant, we will start with the lowest dose of Adderall if we need to we can increase the dose before the next visit.  He will watch for any changes in mood.  No other changes are necessary.   Restart Adderall 5 mg, 1 p.o. every morning. Continue Abilify 20 mg, 1 p.o.  daily. Continue Wellbutrin XL 300 mg, 1 p.o. daily. Continue gabapentin 400 mg, 1 p.o. 4 times daily. Continue propranolol 10 mg, 1 p.o. twice daily as needed anxiety. Continue Cialis 10 mg, 1 p.o. daily as needed. Return in 3 months.  Melony Overly, PA-C

## 2023-05-27 ENCOUNTER — Telehealth: Payer: Self-pay | Admitting: Physician Assistant

## 2023-05-27 NOTE — Telephone Encounter (Signed)
 Pt called and LM at 2:20 today to update Melony Overly on status.  He says the Adderall is working fine and he feels that the dose could even be higher. He will need a RF before the next appt.

## 2023-05-27 NOTE — Telephone Encounter (Signed)
 Tried to call patient but mailbox is full. Will attempt to call again on Monday.

## 2023-05-30 NOTE — Telephone Encounter (Addendum)
 LVM to RC. LF 3/3 5 mg

## 2023-05-30 NOTE — Telephone Encounter (Signed)
 Asked patient if it wasn't strong enough to help with focus or it was just wearing off too early. He reported a little bit of both. He said his numbers have improved at work, but he thinks there is room for improvement.  Pharm[facility] - [facility].

## 2023-05-31 NOTE — Telephone Encounter (Signed)
 Patient feels the Adderall is helping, but he could use a little bit stronger. LF 3/3, due 4/1. If appropriate will pend when due.

## 2023-06-01 ENCOUNTER — Encounter (HOSPITAL_COMMUNITY): Payer: Self-pay | Admitting: Emergency Medicine

## 2023-06-01 ENCOUNTER — Other Ambulatory Visit: Payer: Self-pay | Admitting: Physician Assistant

## 2023-06-01 ENCOUNTER — Ambulatory Visit (HOSPITAL_COMMUNITY)
Admission: EM | Admit: 2023-06-01 | Discharge: 2023-06-01 | Disposition: A | Discharge status: Home / Self Care | Payer: Self-pay | Attending: Emergency Medicine | Admitting: Emergency Medicine

## 2023-06-01 DIAGNOSIS — M109 Gout, unspecified: Secondary | ICD-10-CM

## 2023-06-01 MED ORDER — AMPHETAMINE-DEXTROAMPHETAMINE 10 MG PO TABS: 10.0000 mg | ORAL_TABLET | Freq: Every day | ORAL | 0 refills | Status: DC

## 2023-06-01 MED ORDER — IMIQUIMOD 5 % EX CREA: TOPICAL_CREAM | CUTANEOUS | 0 refills | Status: DC

## 2023-06-01 MED ORDER — PREDNISONE 10 MG PO TABS
ORAL_TABLET | ORAL | 0 refills | Status: DC
Start: 2023-06-01 — End: 2023-08-03

## 2023-06-01 NOTE — Discharge Instructions (Addendum)
 Start taking prednisone taper as instructed to help with inflammation causing the pain in your foot.  You can take Tylenol every 4-6 hours as needed for pain.  I have refilled your Imiquimod cream as requested.  I have attached [facility] that you can follow-up with if your symptoms persist.  Return here if symptoms persist or worsen.

## 2023-06-01 NOTE — ED Provider Notes (Signed)
 [facility]    CSN: [id number] Arrival date & time: 06/01/23  1533      History   Chief Complaint Chief Complaint  Patient presents with   Foot Pain    HPI Nicholas Melton is a 53 y.o. male.   Patient presents with right foot pain that began yesterday.  Patient states that initially foot was tingling when he woke up yesterday and became worse as the day went on.  Denies any falls or known injury. Reports taking ibuprofen without relief.  Patient is also requesting a refill of Imiquimod that he uses for warts.     Foot Pain    Past Medical History:  Diagnosis Date   Anxiety    Bipolar 2 disorder (HCC)    Depression    Hypertension    IBS (irritable bowel syndrome)    Night terrors, adult    OCD (obsessive compulsive disorder)    Panic attacks    Posttraumatic stress disorder    Social phobia     Patient Active Problem List   Diagnosis Date Noted   Bipolar 1 disorder, depressed, severe (HCC) 11/11/2017   Substance induced mood disorder (HCC)    Overdose 01/22/2016   Malnutrition of moderate degree 01/22/2016   Intentional drug overdose (HCC)    Acute respiratory failure with hypoxemia (HCC)    Bipolar 2 disorder, major depressive episode (HCC) 02/14/2015   PTSD (post-traumatic stress disorder) 06/15/2011    Class: Acute    Past Surgical History:  Procedure Laterality Date   SYMPATHECTOMY     VASECTOMY         Home Medications    Prior to Admission medications   Medication Sig Start Date End Date Taking? Authorizing Provider  imiquimod (ALDARA) 5 % cream Apply topically 3 (three) times a week. 06/01/23  Yes Susann Givens, Demya Scruggs A, NP  predniSONE (DELTASONE) 10 MG tablet Take 4 tabs by mouth daily for 3 days, then 3 tabs for 3 days, then 2 tabs for 2 days, then 1 tab for 2 days 06/01/23  Yes Wynonia Lawman A, NP  amphetamine-dextroamphetamine (ADDERALL) 10 MG tablet Take 1 tablet (10 mg total) by mouth daily with breakfast. 06/01/23   Melony Overly T, PA-C  ARIPiprazole (ABILIFY) 20 MG tablet Take 1 tablet (20 mg total) by mouth daily. 05/05/23   Cherie Ouch, PA-C  buPROPion (WELLBUTRIN XL) 300 MG 24 hr tablet Take 1 tablet (300 mg total) by mouth daily. 05/05/23   Melony Overly T, PA-C  cetirizine (ZYRTEC) 10 MG tablet Take 10 mg by mouth daily as needed for allergies.    [provider]  gabapentin (NEURONTIN) 400 MG capsule Take 1 capsule (400 mg total) by mouth 4 (four) times daily. 05/05/23   Melony Overly T, PA-C  loperamide (IMODIUM) 2 MG capsule Take by mouth as needed for diarrhea or loose stools.    [provider]  propranolol (INDERAL) 10 MG tablet Take 1 tablet (10 mg total) by mouth 2 (two) times daily as needed. 05/05/23   Melony Overly T, PA-C  tadalafil (CIALIS) 10 MG tablet Take 1 tablet (10 mg total) by mouth daily as needed for erectile dysfunction. 05/05/23   Cherie Ouch, PA-C    Family History Family History  Problem Relation Age of Onset   Cancer Brother    Drug abuse Paternal Uncle    Alcohol abuse Paternal Grandfather     Social History Social History   Tobacco Use   Smoking status:  Every Day    Types: E-cigarettes   Smokeless tobacco: Former  Substance Use Topics   Alcohol use: Not Currently    Alcohol/week: 0.0 standard drinks of alcohol   Drug use: Not Currently     Allergies   Risperidone and related, Seroquel [quetiapine], Depakote er [divalproex sodium er], Ambien [zolpidem tartrate], and Lamictal [lamotrigine]   Review of Systems Review of Systems  Per HPI  Physical Exam Triage Vital Signs ED Triage Vitals  Encounter Vitals Group     BP 06/01/23 1613 (!) 153/94     Systolic BP Percentile --      Diastolic BP Percentile --      Pulse Rate 06/01/23 1613 68     Resp 06/01/23 1613 14     Temp 06/01/23 1613 98.4 F (36.9 C)     Temp Source 06/01/23 1613 Oral     SpO2 06/01/23 1613 98 %     Weight --      Height --      Head Circumference --      Peak  Flow --      Pain Score 06/01/23 1612 8     Pain Loc --      Pain Education --      Exclude from Growth Chart --    No data found.  Updated Vital Signs BP (!) 153/94 (BP Location: Left Arm)   Pulse 68   Temp 98.4 F (36.9 C) (Oral)   Resp 14   SpO2 98%   Visual Acuity Right Eye Distance:   Left Eye Distance:   Bilateral Distance:    Right Eye Near:   Left Eye Near:    Bilateral Near:     Physical Exam Vitals and nursing note reviewed.  Constitutional:      General: He is awake. He is not in acute distress.    Appearance: Normal appearance. He is well-developed and well-groomed. He is not ill-appearing.  Musculoskeletal:     Right foot: Normal range of motion. Tenderness present. No swelling or bony tenderness.     Comments: Tenderness noted to plantar aspect of foot above the proximal phalanx of right great toe.  Neurological:     Mental Status: He is alert.  Psychiatric:        Behavior: Behavior is cooperative.      UC Treatments / Results  Labs (all labs ordered are listed, but only abnormal results are displayed) Labs Reviewed - No data to display  EKG   Radiology No results found.  Procedures Procedures (including critical care time)  Medications Ordered in UC Medications - No data to display  Initial Impression / Assessment and Plan / UC Course  I have reviewed the triage vital signs and the nursing notes.  Pertinent labs & imaging results that were available during my care of the patient were reviewed by me and considered in my medical decision making (see chart for details).     Upon assessment there is tenderness noted to the plantar aspect of the right foot above the proximal phalanx of the right right great toe.  Endorses pain with flexion of great toe, without decreased range of motion or swelling noted.  Prescribed prednisone taper for possible underlying gout.  Recommended Tylenol as needed for pain.  Refilled Imiquimod as  requested.  Discussed follow-up and return precautions. Final Clinical Impressions(s) / UC Diagnoses   Final diagnoses:  Acute gout of right foot, unspecified cause     Discharge Instructions  Start taking prednisone taper as instructed to help with inflammation causing the pain in your foot.  You can take Tylenol every 4-6 hours as needed for pain.  I have refilled your Imiquimod cream as requested.  I have attached [facility] that you can follow-up with if your symptoms persist.  Return here if symptoms persist or worsen.   ED Prescriptions     Medication Sig Dispense Auth. Provider   predniSONE (DELTASONE) 10 MG tablet Take 4 tabs by mouth daily for 3 days, then 3 tabs for 3 days, then 2 tabs for 2 days, then 1 tab for 2 days 28 tablet Susann Givens, Nazair Fortenberry A, NP   imiquimod (ALDARA) 5 % cream Apply topically 3 (three) times a week. 12 each Letta Kocher, NP      PDMP not reviewed this encounter.   Wynonia Lawman A, NP 06/01/23 [phone]

## 2023-06-01 NOTE — ED Triage Notes (Signed)
 Pt reports right foot was tingling when woke up yesterday and got worse as day went on. Reports today woke up with lots of pain with swelling. Denies any injury. Tried taking ibuprofen last night that did not help.

## 2023-06-01 NOTE — Telephone Encounter (Signed)
 I sent in the Rx for Adderall 10 mg, 1 q am, with note asking pharmacy to fill now b/c of dose change.  Have him take 2 of the 5 mg pills until they're gone.

## 2023-06-01 NOTE — Telephone Encounter (Signed)
 Patient notified of Rx and instructions for 10 mg Adderall.

## 2023-07-14 ENCOUNTER — Ambulatory Visit: Payer: Self-pay | Admitting: Nurse Practitioner

## 2023-07-19 ENCOUNTER — Other Ambulatory Visit: Payer: Self-pay

## 2023-07-19 ENCOUNTER — Telehealth: Payer: Self-pay | Admitting: Physician Assistant

## 2023-07-19 MED ORDER — AMPHETAMINE-DEXTROAMPHETAMINE 10 MG PO TABS: 10.0000 mg | ORAL_TABLET | Freq: Every day | ORAL | 0 refills | Status: DC

## 2023-07-19 NOTE — Telephone Encounter (Signed)
 Pended Adderall 10 mg to [place].

## 2023-07-19 NOTE — Telephone Encounter (Signed)
 Pt called asking for a refill on his adderall 10 mg. Pharmacy is [facility] and his next apppt is 5/21

## 2023-08-03 ENCOUNTER — Encounter: Payer: Self-pay | Admitting: Physician Assistant

## 2023-08-03 ENCOUNTER — Ambulatory Visit (INDEPENDENT_AMBULATORY_CARE_PROVIDER_SITE_OTHER): Payer: Self-pay | Admitting: Physician Assistant

## 2023-08-03 DIAGNOSIS — G4726 Circadian rhythm sleep disorder, shift work type: Secondary | ICD-10-CM

## 2023-08-03 DIAGNOSIS — F3181 Bipolar II disorder: Secondary | ICD-10-CM

## 2023-08-03 MED ORDER — AMPHETAMINE-DEXTROAMPHET ER 15 MG PO CP24: 15.0000 mg | ORAL_CAPSULE | ORAL | 0 refills | Status: DC

## 2023-08-03 NOTE — Progress Notes (Unsigned)
 [facility]  Patient ID: Nicholas Melton,  MRN: [id number]  PCP: Patient, No Pcp Per  Date of Evaluation: 08/03/2023 Time spent:20 minutes  Chief Complaint:  Chief Complaint   ADD; Depression; Follow-up    HISTORY/CURRENT STATUS: HPI  For routine med check.   We added Adderall at the LOV    Denies dizziness, syncope, seizures, numbness, tingling, tremor, tics, unsteady gait, slurred speech, confusion. Denies muscle or joint pain, stiffness, or dystonia. Denies unexplained weight loss, frequent infections, or sores that heal slowly.  No polyphagia, polydipsia, or polyuria. Denies visual changes or paresthesias.   Individual Medical History/ Review of Systems: Changes? :No   Past medication trials: Seroquel -allergic reaction Zyprexa- multiple side effects Risperdal-dissociation Rexulti-felt disoriented Latuda  Vraylar Abilify -helpful for depression Depakote Lamictal-adverse reaction Carbamazepine - dizziness, lethargic Lithium-lethargic, irritability Gabapentin -helpful for anxiety Trileptal-lethargic, joint pain, back pain BuSpar-adverse reaction Vistaril  Anafranil Mirtazapine-helpful for sleep and night terrors Effexor Paxil Prozac- sexual side effects Viibryd - loose stools Wellbutrin -helpful for depression Naltrexone-nausea Modafanil- jittery. Intolerable side effects.  Armodafinil -increased irritability Propranolol - some benefit with anxiety.   Allergies: Risperidone and related, Seroquel  [quetiapine ], Depakote er [divalproex sodium er], Ambien  [zolpidem  tartrate], and Lamictal [lamotrigine]  Current Medications:  Current Outpatient Medications:    amphetamine -dextroamphetamine  (ADDERALL XR) 15 MG 24 hr capsule, Take 1 capsule by mouth every morning., Disp: 30 capsule, Rfl: 0   ARIPiprazole  (ABILIFY ) 20 MG tablet, Take 1 tablet (20 mg total) by mouth daily., Disp: 90 tablet, Rfl: 1   buPROPion  (WELLBUTRIN  XL) 300 MG 24 hr tablet, Take 1 tablet (300  mg total) by mouth daily., Disp: 90 tablet, Rfl: 1   gabapentin  (NEURONTIN ) 400 MG capsule, Take 1 capsule (400 mg total) by mouth 4 (four) times daily., Disp: 360 capsule, Rfl: 1   loperamide  (IMODIUM ) 2 MG capsule, Take by mouth as needed for diarrhea or loose stools., Disp: , Rfl:    propranolol  (INDERAL ) 10 MG tablet, Take 1 tablet (10 mg total) by mouth 2 (two) times daily as needed., Disp: 180 tablet, Rfl: 1   tadalafil  (CIALIS ) 10 MG tablet, Take 1 tablet (10 mg total) by mouth daily as needed for erectile dysfunction., Disp: 30 tablet, Rfl: 5   cetirizine (ZYRTEC) 10 MG tablet, Take 10 mg by mouth daily as needed for allergies. (Patient not taking: Reported on 08/03/2023), Disp: , Rfl:    imiquimod  (ALDARA ) 5 % cream, Apply topically 3 (three) times a week. (Patient not taking: Reported on 08/03/2023), Disp: 12 each, Rfl: 0 Medication Side Effects: none  Family Medical/ Social History: Changes? No  MENTAL HEALTH EXAM:  There were no vitals taken for this visit.There is no height or weight on file to calculate BMI.  General Appearance: Casual and Well Groomed  Eye Contact:  Good  Speech:  Clear and Coherent and Normal Rate  Volume:  Normal  Mood:  Euthymic  Affect:  Congruent  Thought Process:  Goal Directed and Descriptions of Associations: Circumstantial  Orientation:  Full (Time, Place, and Person)  Thought Content: Logical   Suicidal Thoughts:  No  Homicidal Thoughts:  No  Memory:  WNL  Judgement:  Good  Insight:  Good  Psychomotor Activity:  Normal  Concentration:  Concentration: Good  Recall:  Good  Fund of Knowledge: Good  Language: Good  Assets:  Desire for Improvement Financial Resources/Insurance Housing Transportation Vocational/Educational  ADL's:  Intact  Cognition: WNL  Prognosis:  Good   DIAGNOSES:  No diagnosis found.  Receiving Psychotherapy: No   RECOMMENDATIONS:   PDMP  reviewed.  Gabapentin  filled Adderall filled 07/20/2023. Gabapentin  filled  05/15/3033.  I provided 20 minutes of face to face time during this encounter, including time spent before and after the visit in records review, medical decision making, counseling pertinent to today's visit, and charting.        Adderall 5 mg, 1 p.o. every morning. Continue Abilify  20 mg, 1 p.o. daily. Continue Wellbutrin  XL 300 mg, 1 p.o. daily. Continue gabapentin  400 mg, 1 p.o. 4 times daily. Continue propranolol  10 mg, 1 p.o. twice daily as needed anxiety. Continue Cialis  10 mg, 1 p.o. daily as needed. Return in 3 months.  Marvia Slocumb, PA-C

## 2023-08-09 ENCOUNTER — Ambulatory Visit: Payer: Self-pay | Admitting: Nurse Practitioner

## 2023-09-20 ENCOUNTER — Encounter: Payer: Self-pay | Admitting: Physician Assistant

## 2023-09-20 ENCOUNTER — Ambulatory Visit (INDEPENDENT_AMBULATORY_CARE_PROVIDER_SITE_OTHER): Payer: Self-pay | Admitting: Physician Assistant

## 2023-09-20 DIAGNOSIS — F3181 Bipolar II disorder: Secondary | ICD-10-CM

## 2023-09-20 DIAGNOSIS — F909 Attention-deficit hyperactivity disorder, unspecified type: Secondary | ICD-10-CM

## 2023-09-20 DIAGNOSIS — F419 Anxiety disorder, unspecified: Secondary | ICD-10-CM

## 2023-09-20 DIAGNOSIS — G4726 Circadian rhythm sleep disorder, shift work type: Secondary | ICD-10-CM

## 2023-09-20 MED ORDER — AMPHETAMINE-DEXTROAMPHET ER 20 MG PO CP24
20.0000 mg | ORAL_CAPSULE | Freq: Every day | ORAL | 0 refills | Status: DC
Start: 2023-09-20 — End: 2023-10-27

## 2023-09-20 NOTE — Progress Notes (Signed)
 [facility]  Patient ID: Nicholas Melton,  MRN: [id number]  PCP: Patient, No Pcp Per  Date of Evaluation: 09/20/2023 Time spent:20 minutes  Chief Complaint:  Chief Complaint   ADHD; Depression; Follow-up    HISTORY/CURRENT STATUS: HPI  For routine med check.   We changed Adderall from IR to XR about 6 weeks ago. It seems to be working better but wonders if increasing the dose would help focus/concentration even more. No SE from it. Sleeps pretty well with his schedule on night shift. Work is going well, more able to get going with energy and motivation with the Adderall XR.   Patient is able to enjoy things.   No extreme sadness, tearfulness, or feelings of hopelessness.  ADLs and personal hygiene are normal.   Appetite nl.  No PA. No mania, psychosis, delirium.  No SI/HI.  Denies dizziness, syncope, seizures, numbness, tingling, tremor, tics, unsteady gait, slurred speech, confusion. Denies muscle or joint pain, stiffness, or dystonia. Denies unexplained weight loss, frequent infections, or sores that heal slowly.  No polyphagia, polydipsia, or polyuria. Denies visual changes or paresthesias.   Individual Medical History/ Review of Systems: Changes? :No   Past medication trials: Seroquel -allergic reaction Zyprexa- multiple side effects Risperdal-dissociation Rexulti-felt disoriented Latuda  Vraylar Abilify -helpful for depression Depakote Lamictal-adverse reaction Carbamazepine - dizziness, lethargic Lithium-lethargic, irritability Gabapentin -helpful for anxiety Trileptal-lethargic, joint pain, back pain BuSpar-adverse reaction Vistaril  Anafranil Mirtazapine-helpful for sleep and night terrors Effexor Paxil Prozac- sexual side effects Viibryd - loose stools Wellbutrin -helpful for depression Naltrexone-nausea Modafanil- jittery. Intolerable side effects.  Armodafinil -increased irritability Propranolol - some benefit with anxiety.   Allergies: Risperidone and  paliperidone, Seroquel  [quetiapine ], Depakote er [divalproex sodium er], Ambien  [zolpidem  tartrate], and Lamictal [lamotrigine]  Current Medications:  Current Outpatient Medications:    amphetamine -dextroamphetamine  (ADDERALL XR) 20 MG 24 hr capsule, Take 1 capsule (20 mg total) by mouth daily., Disp: 30 capsule, Rfl: 0   ARIPiprazole  (ABILIFY ) 20 MG tablet, Take 1 tablet (20 mg total) by mouth daily., Disp: 90 tablet, Rfl: 1   buPROPion  (WELLBUTRIN  XL) 300 MG 24 hr tablet, Take 1 tablet (300 mg total) by mouth daily., Disp: 90 tablet, Rfl: 1   gabapentin  (NEURONTIN ) 400 MG capsule, Take 1 capsule (400 mg total) by mouth 4 (four) times daily., Disp: 360 capsule, Rfl: 1   loperamide  (IMODIUM ) 2 MG capsule, Take by mouth as needed for diarrhea or loose stools., Disp: , Rfl:    propranolol  (INDERAL ) 10 MG tablet, Take 1 tablet (10 mg total) by mouth 2 (two) times daily as needed., Disp: 180 tablet, Rfl: 1   tadalafil  (CIALIS ) 10 MG tablet, Take 1 tablet (10 mg total) by mouth daily as needed for erectile dysfunction., Disp: 30 tablet, Rfl: 5   cetirizine (ZYRTEC) 10 MG tablet, Take 10 mg by mouth daily as needed for allergies. (Patient not taking: Reported on 08/03/2023), Disp: , Rfl:    imiquimod  (ALDARA ) 5 % cream, Apply topically 3 (three) times a week. (Patient not taking: Reported on 08/03/2023), Disp: 12 each, Rfl: 0 Medication Side Effects: none  Family Medical/ Social History: Changes? No  MENTAL HEALTH EXAM:  There were no vitals taken for this visit.There is no height or weight on file to calculate BMI.  General Appearance: Casual and Well Groomed  Eye Contact:  Good  Speech:  Clear and Coherent and Normal Rate  Volume:  Normal  Mood:  Euthymic  Affect:  Congruent  Thought Process:  Goal Directed and Descriptions of Associations: Circumstantial  Orientation:  Full (Time,  Place, and Person)  Thought Content: Logical   Suicidal Thoughts:  No  Homicidal Thoughts:  No  Memory:  WNL   Judgement:  Good  Insight:  Good  Psychomotor Activity:  Normal  Concentration:  Concentration: Good and Attention Span: Good  Recall:  Good  Fund of Knowledge: Good  Language: Good  Assets:  Communication Skills Desire for Improvement Financial Resources/Insurance Housing Transportation Vocational/Educational  ADL's:  Intact  Cognition: WNL  Prognosis:  Good   DIAGNOSES:    ICD-10-CM   1. Attention deficit hyperactivity disorder (ADHD), unspecified ADHD type  F90.9     2. Bipolar 2 disorder (HCC)  F31.81     3. Anxiety disorder, unspecified type  F41.9     4. 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  Gabapentin  filled 08/24/2023.  Adderall filled 08/18/2023. I provided approximately  20  minutes of face to face time during this encounter, including time spent before and after the visit in records review, medical decision making, counseling pertinent to today's visit, and charting.   He's doing well with the Adderall XR.  Recommend increasing the dose.  Call in 3 weeks to let me know how well the 20 mg is working-if good, I'll send in 3 more Rx to get to next OV.  If not, leave feedback so I'll know what dose is appropriate.   Increase Adderall XR to 20 mg every morning. Continue Abilify  20 mg, 1 p.o. daily. Continue Wellbutrin  XL 300 mg, 1 p.o. daily. Continue gabapentin  400 mg, 1 p.o. 4 times daily. Continue propranolol  10 mg, 1 p.o. twice daily as needed anxiety. Continue Cialis  10 mg, 1 p.o. daily as needed. Return in 3-4 months.    Verneita Cooks, PA-C

## 2023-10-24 ENCOUNTER — Telehealth: Payer: Self-pay | Admitting: Physician Assistant

## 2023-10-24 NOTE — Telephone Encounter (Signed)
 LVM to [facility]

## 2023-10-24 NOTE — Telephone Encounter (Signed)
 Next visit is 12/26/23. Nicholas Melton said Adderall 20 mg is working well. Nicholas Melton wants to know if it can be raised slightly on his next RX? His phone number is [phone]. Pharmacy is:  [facility], [place] - [address]   Phone: [phone]  Fax: [phone]

## 2023-10-25 NOTE — Telephone Encounter (Signed)
 Left second VM to [facility]

## 2023-10-26 NOTE — Telephone Encounter (Signed)
 Sent MyChart message with response.

## 2023-10-27 ENCOUNTER — Other Ambulatory Visit: Payer: Self-pay | Admitting: Physician Assistant

## 2023-10-27 MED ORDER — AMPHETAMINE-DEXTROAMPHET ER 25 MG PO CP24: 25.0000 mg | ORAL_CAPSULE | ORAL | 0 refills | Status: DC

## 2023-10-27 NOTE — Telephone Encounter (Signed)
 Rx sent.

## 2023-12-01 ENCOUNTER — Telehealth: Payer: Self-pay | Admitting: Physician Assistant

## 2023-12-01 DIAGNOSIS — F419 Anxiety disorder, unspecified: Secondary | ICD-10-CM

## 2023-12-01 MED ORDER — GABAPENTIN 400 MG PO CAPS: 400.0000 mg | ORAL_CAPSULE | Freq: Four times a day (QID) | ORAL | 0 refills | Status: DC

## 2023-12-01 NOTE — Telephone Encounter (Signed)
 Pt called at 2:48p requesting refill of Gabapentin  to   [facility], [place] - [address], [place] [address] Phone: [phone]  Fax: [phone]   Pt said he has 1 pill left. Next appt 10/13

## 2023-12-01 NOTE — Telephone Encounter (Signed)
 Sent!

## 2023-12-08 ENCOUNTER — Telehealth: Payer: Self-pay | Admitting: Physician Assistant

## 2023-12-08 NOTE — Telephone Encounter (Signed)
 Appt 10/13   Needs rf of adderall   [place]

## 2023-12-09 ENCOUNTER — Other Ambulatory Visit: Payer: Self-pay

## 2023-12-09 DIAGNOSIS — F909 Attention-deficit hyperactivity disorder, unspecified type: Secondary | ICD-10-CM

## 2023-12-09 MED ORDER — AMPHETAMINE-DEXTROAMPHET ER 25 MG PO CP24
25.0000 mg | ORAL_CAPSULE | ORAL | 0 refills | Status: DC
Start: 2023-12-09 — End: 2023-12-26

## 2023-12-09 NOTE — Telephone Encounter (Signed)
 Pended

## 2023-12-26 ENCOUNTER — Ambulatory Visit (INDEPENDENT_AMBULATORY_CARE_PROVIDER_SITE_OTHER): Payer: Self-pay | Admitting: Physician Assistant

## 2023-12-26 ENCOUNTER — Encounter: Payer: Self-pay | Admitting: Physician Assistant

## 2023-12-26 DIAGNOSIS — F909 Attention-deficit hyperactivity disorder, unspecified type: Secondary | ICD-10-CM

## 2023-12-26 DIAGNOSIS — F419 Anxiety disorder, unspecified: Secondary | ICD-10-CM

## 2023-12-26 DIAGNOSIS — F3181 Bipolar II disorder: Secondary | ICD-10-CM

## 2023-12-26 MED ORDER — AMPHETAMINE-DEXTROAMPHET ER 25 MG PO CP24: 25.0000 mg | ORAL_CAPSULE | ORAL | 0 refills | Status: DC

## 2023-12-26 MED ORDER — ARIPIPRAZOLE 20 MG PO TABS: 20.0000 mg | ORAL_TABLET | Freq: Every day | ORAL | 1 refills | Status: AC

## 2023-12-26 MED ORDER — PROPRANOLOL HCL 10 MG PO TABS: 10.0000 mg | ORAL_TABLET | Freq: Two times a day (BID) | ORAL | 1 refills | Status: AC | PRN

## 2023-12-26 MED ORDER — GABAPENTIN 400 MG PO CAPS: 400.0000 mg | ORAL_CAPSULE | Freq: Four times a day (QID) | ORAL | 1 refills | Status: AC

## 2023-12-26 MED ORDER — BUPROPION HCL ER (XL) 300 MG PO TB24: 300.0000 mg | ORAL_TABLET | Freq: Every day | ORAL | 1 refills | Status: AC

## 2023-12-26 NOTE — Progress Notes (Signed)
 [facility]  Patient ID: Nicholas Melton,  MRN: [id number]  PCP: Patient, No Pcp Per  Date of Evaluation: 12/26/2023 Time spent:20 minutes  Chief Complaint:  Chief Complaint   ADHD; Depression; Follow-up    HISTORY/CURRENT STATUS: HPI  For routine med check.   Nicholas Melton is doing well.  We increased the Adderall since he was here last and the dose feels just right at this time. States that attention is good without easy distractibility.  Able to focus on things and finish tasks to completion.   Patient is able to enjoy things.  Energy and motivation are good.  Work is going well.   No extreme sadness, tearfulness, or feelings of hopelessness.  Sleeps ok.  Anxiety isn't an issue right now.  ADLs and personal hygiene are normal.  Appetite has not changed.  No mania, delirium, AH/VH.  No SI/HI.  Individual Medical History/ Review of Systems: Changes? :No   Past medication trials: Seroquel -allergic reaction Zyprexa- multiple side effects Risperdal-dissociation Rexulti-felt disoriented Latuda  Vraylar Abilify -helpful for depression Depakote Lamictal-adverse reaction Carbamazepine - dizziness, lethargic Lithium-lethargic, irritability Gabapentin -helpful for anxiety Trileptal-lethargic, joint pain, back pain BuSpar-adverse reaction Vistaril  Anafranil Mirtazapine-helpful for sleep and night terrors Effexor Paxil Prozac- sexual side effects Viibryd - loose stools Wellbutrin -helpful for depression Naltrexone-nausea Modafanil- jittery. Intolerable side effects.  Armodafinil -increased irritability Propranolol - some benefit with anxiety.   Allergies: Risperidone and paliperidone, Seroquel  [quetiapine ], Depakote er [divalproex sodium er], Ambien  [zolpidem  tartrate], and Lamictal [lamotrigine]  Current Medications:  Current Outpatient Medications:    [START ON 02/08/2024] amphetamine -dextroamphetamine  (ADDERALL XR) 25 MG 24 hr capsule, Take 1 capsule by mouth every  morning., Disp: 30 capsule, Rfl: 0   [START ON 03/06/2024] amphetamine -dextroamphetamine  (ADDERALL XR) 25 MG 24 hr capsule, Take 1 capsule by mouth every morning., Disp: 30 capsule, Rfl: 0   loperamide  (IMODIUM ) 2 MG capsule, Take by mouth as needed for diarrhea or loose stools., Disp: , Rfl:    tadalafil  (CIALIS ) 10 MG tablet, Take 1 tablet (10 mg total) by mouth daily as needed for erectile dysfunction., Disp: 30 tablet, Rfl: 5   [START ON 01/10/2024] amphetamine -dextroamphetamine  (ADDERALL XR) 25 MG 24 hr capsule, Take 1 capsule by mouth every morning., Disp: 30 capsule, Rfl: 0   ARIPiprazole  (ABILIFY ) 20 MG tablet, Take 1 tablet (20 mg total) by mouth daily., Disp: 90 tablet, Rfl: 1   buPROPion  (WELLBUTRIN  XL) 300 MG 24 hr tablet, Take 1 tablet (300 mg total) by mouth daily., Disp: 90 tablet, Rfl: 1   cetirizine (ZYRTEC) 10 MG tablet, Take 10 mg by mouth daily as needed for allergies. (Patient not taking: Reported on 08/03/2023), Disp: , Rfl:    gabapentin  (NEURONTIN ) 400 MG capsule, Take 1 capsule (400 mg total) by mouth 4 (four) times daily., Disp: 360 capsule, Rfl: 1   imiquimod  (ALDARA ) 5 % cream, Apply topically 3 (three) times a week. (Patient not taking: Reported on 08/03/2023), Disp: 12 each, Rfl: 0   propranolol  (INDERAL ) 10 MG tablet, Take 1 tablet (10 mg total) by mouth 2 (two) times daily as needed., Disp: 180 tablet, Rfl: 1 Medication Side Effects: none  Family Medical/ Social History: Changes? No  MENTAL HEALTH EXAM:  There were no vitals taken for this visit.There is no height or weight on file to calculate BMI.  General Appearance: Casual and Well Groomed  Eye Contact:  Good  Speech:  Clear and Coherent and Normal Rate  Volume:  Normal  Mood:  Euthymic  Affect:  Congruent  Thought Process:  Goal Directed  and Descriptions of Associations: Circumstantial  Orientation:  Full (Time, Place, and Person)  Thought Content: Logical   Suicidal Thoughts:  No  Homicidal Thoughts:   No  Memory:  WNL  Judgement:  Good  Insight:  Good  Psychomotor Activity:  Normal  Concentration:  Concentration: Good and Attention Span: Good  Recall:  Good  Fund of Knowledge: Good  Language: Good  Assets:  Communication Skills Desire for Improvement Financial Resources/Insurance Housing Resilience Transportation Vocational/Educational  ADL's:  Intact  Cognition: WNL  Prognosis:  Good   DIAGNOSES:    ICD-10-CM   1. Attention deficit hyperactivity disorder (ADHD), unspecified ADHD type  F90.9 amphetamine -dextroamphetamine  (ADDERALL XR) 25 MG 24 hr capsule    2. Bipolar 2 disorder (HCC)  F31.81 ARIPiprazole  (ABILIFY ) 20 MG tablet    buPROPion  (WELLBUTRIN  XL) 300 MG 24 hr tablet    3. Anxiety disorder, unspecified type  F41.9 propranolol  (INDERAL ) 10 MG tablet    gabapentin  (NEURONTIN ) 400 MG capsule      Receiving Psychotherapy: No   RECOMMENDATIONS:   PDMP reviewed.  Gabapentin  filled 12/01/2023.  Adderall filled 12/13/2023. I provided approximately  20 minutes of face to face time during this encounter, including time spent before and after the visit in records review, medical decision making, counseling pertinent to today's visit, and charting.   He is doing well on current medications so no changes need to be made.  Continue Adderall XR 25 mg every morning. Continue Abilify  20 mg, 1 p.o. daily. Continue Wellbutrin  XL 300 mg, 1 p.o. daily. Continue gabapentin  400 mg, 1 p.o. 4 times daily. Continue propranolol  10 mg, 1 p.o. twice daily as needed anxiety. Continue Cialis  10 mg, 1 p.o. daily as needed. Return in 3-4 months.    Verneita Cooks, PA-C

## 2024-03-05 ENCOUNTER — Telehealth: Payer: Self-pay | Admitting: Physician Assistant

## 2024-03-05 NOTE — Telephone Encounter (Signed)
 Pt has RF available. LVM to [facility].

## 2024-03-05 NOTE — Telephone Encounter (Signed)
 Pt needs rf of Gabapentin    [facility]

## 2024-04-10 ENCOUNTER — Ambulatory Visit: Payer: Self-pay | Admitting: Physician Assistant

## 2024-04-10 ENCOUNTER — Encounter: Payer: Self-pay | Admitting: Physician Assistant

## 2024-04-10 DIAGNOSIS — F419 Anxiety disorder, unspecified: Secondary | ICD-10-CM

## 2024-04-10 DIAGNOSIS — F909 Attention-deficit hyperactivity disorder, unspecified type: Secondary | ICD-10-CM

## 2024-04-10 DIAGNOSIS — F3181 Bipolar II disorder: Secondary | ICD-10-CM

## 2024-04-10 MED ORDER — AMPHETAMINE-DEXTROAMPHET ER 25 MG PO CP24: 25.0000 mg | ORAL_CAPSULE | ORAL | 0 refills | Status: AC

## 2024-04-10 NOTE — Progress Notes (Signed)
 "     [facility]  Patient ID: Nicholas Melton,  MRN: [id number]  PCP: Patient, No Pcp Per  Date of Evaluation: 04/10/2024 Time spent:20 minutes  Chief Complaint:  Chief Complaint   ADHD; Depression; Anxiety; Follow-up    HISTORY/CURRENT STATUS: HPI  For routine med check.   Work is going well, has been at [employer] 4 years, likes it.  Still works second shift.  He is sleeping just fine.  Feels rested when he gets up.  He is able to enjoy things.  Energy and motivation are good.  No problems with appetite.  ADLs and personal hygiene are normal.  No complaints of anxiety.  No feelings of hopelessness.  No mania, delirium, psychosis, suicidal or homicidal thoughts.  The Adderall is still effective. States that attention is good without easy distractibility.  Able to focus on things and finish tasks to completion.   Individual Medical History/ Review of Systems: Changes? :No   Past medication trials: Seroquel -allergic reaction Zyprexa- multiple side effects Risperdal-dissociation Rexulti-felt disoriented Latuda  Vraylar Abilify -helpful for depression Depakote Lamictal-adverse reaction Carbamazepine - dizziness, lethargic Lithium-lethargic, irritability Gabapentin -helpful for anxiety Trileptal-lethargic, joint pain, back pain BuSpar-adverse reaction Vistaril  Anafranil Mirtazapine-helpful for sleep and night terrors Effexor Paxil Prozac- sexual side effects Viibryd - loose stools Wellbutrin -helpful for depression Naltrexone-nausea Modafanil- jittery. Intolerable side effects.  Armodafinil -increased irritability Propranolol - some benefit with anxiety.   Allergies: Risperidone and paliperidone, Seroquel  [quetiapine ], Depakote er [divalproex sodium er], Ambien  [zolpidem  tartrate], and Lamictal [lamotrigine]  Current Medications:  Current Outpatient Medications:    ARIPiprazole  (ABILIFY ) 20 MG tablet, Take 1 tablet (20 mg total) by mouth daily., Disp: 90 tablet, Rfl: 1    buPROPion  (WELLBUTRIN  XL) 300 MG 24 hr tablet, Take 1 tablet (300 mg total) by mouth daily., Disp: 90 tablet, Rfl: 1   gabapentin  (NEURONTIN ) 400 MG capsule, Take 1 capsule (400 mg total) by mouth 4 (four) times daily., Disp: 360 capsule, Rfl: 1   loperamide  (IMODIUM ) 2 MG capsule, Take by mouth as needed for diarrhea or loose stools., Disp: , Rfl:    propranolol  (INDERAL ) 10 MG tablet, Take 1 tablet (10 mg total) by mouth 2 (two) times daily as needed., Disp: 180 tablet, Rfl: 1   tadalafil  (CIALIS ) 10 MG tablet, Take 1 tablet (10 mg total) by mouth daily as needed for erectile dysfunction., Disp: 30 tablet, Rfl: 5   amphetamine -dextroamphetamine  (ADDERALL XR) 25 MG 24 hr capsule, Take 1 capsule by mouth every morning., Disp: 30 capsule, Rfl: 0   [START ON 05/09/2024] amphetamine -dextroamphetamine  (ADDERALL XR) 25 MG 24 hr capsule, Take 1 capsule by mouth every morning., Disp: 30 capsule, Rfl: 0   [START ON 06/05/2024] amphetamine -dextroamphetamine  (ADDERALL XR) 25 MG 24 hr capsule, Take 1 capsule by mouth every morning., Disp: 30 capsule, Rfl: 0 Medication Side Effects: none  Family Medical/ Social History: Changes? No  MENTAL HEALTH EXAM:  There were no vitals taken for this visit.There is no height or weight on file to calculate BMI.  General Appearance: Casual and Well Groomed  Eye Contact:  Good  Speech:  Clear and Coherent and Normal Rate  Volume:  Normal  Mood:  Euthymic  Affect:  Congruent  Thought Process:  Goal Directed and Descriptions of Associations: Circumstantial  Orientation:  Full (Time, Place, and Person)  Thought Content: Logical   Suicidal Thoughts:  No  Homicidal Thoughts:  No  Memory:  WNL  Judgement:  Good  Insight:  Good  Psychomotor Activity:  Normal  Concentration:  Concentration: Good  and Attention Span: Good  Recall:  Good  Fund of Knowledge: Good  Language: Good  Assets:  Communication Skills Desire for Improvement Financial  Resources/Insurance Housing Resilience Social Support Transportation Vocational/Educational  ADL's:  Intact  Cognition: WNL  Prognosis:  Good   DIAGNOSES:    ICD-10-CM   1. Bipolar 2 disorder (HCC)  F31.81     2. Attention deficit hyperactivity disorder (ADHD), unspecified ADHD type  F90.9 amphetamine -dextroamphetamine  (ADDERALL XR) 25 MG 24 hr capsule    3. Anxiety disorder, unspecified type  F41.9       Receiving Psychotherapy: No   RECOMMENDATIONS:   PDMP reviewed.  Gabapentin  filled 03/06/2024 Adderall filled 03/06/2024 I provided approximately 20 minutes of face to face time during this encounter, including time spent before and after the visit in records review, medical decision making, counseling pertinent to today's visit, and charting.   He is doing well on the current medications so no changes need to be made.  We discussed the fact that he has not had labs in a while.  He is not sure exactly when.  He is looking for a new PCP, hopes to find one soon and will plan to have routine physical including labs then.  If he has not had labs drawn by the time we see each other then I will order them.  He understands that Abilify  can possibly increase blood sugar and lipids and accepts the risk.  Continue Adderall XR 25 mg every morning. Continue Abilify  20 mg, 1 p.o. daily. Continue Wellbutrin  XL 300 mg, 1 p.o. daily. Continue gabapentin  400 mg, 1 p.o. 4 times daily. Continue propranolol  10 mg, 1 p.o. twice daily as needed anxiety. Continue Cialis  10 mg, 1 p.o. daily as needed. Return in 6 mo.  Verneita Cooks, PA-C  "

## 2024-10-08 ENCOUNTER — Ambulatory Visit: Payer: Self-pay | Admitting: Physician Assistant
